# Patient Record
Sex: Female | Born: 1955 | Hispanic: Yes | Marital: Married | Smoking: Former smoker
Health system: Northeastern US, Community
[De-identification: ages and names within clinical notes are randomized; demographics above are authoritative.]

## PROBLEM LIST (undated history)

## (undated) ENCOUNTER — Ambulatory Visit (HOSPITAL_BASED_OUTPATIENT_CLINIC_OR_DEPARTMENT_OTHER): Payer: Self-pay | Source: Ambulatory Visit | Admitting: Gastroenterology

## (undated) DIAGNOSIS — H5200 Hypermetropia, unspecified eye: Secondary | ICD-10-CM

## (undated) DIAGNOSIS — E7800 Pure hypercholesterolemia, unspecified: Secondary | ICD-10-CM

## (undated) DIAGNOSIS — Z973 Presence of spectacles and contact lenses: Secondary | ICD-10-CM

## (undated) DIAGNOSIS — Z9049 Acquired absence of other specified parts of digestive tract: Secondary | ICD-10-CM

## (undated) DIAGNOSIS — A630 Anogenital (venereal) warts: Secondary | ICD-10-CM

## (undated) DIAGNOSIS — Z9889 Other specified postprocedural states: Secondary | ICD-10-CM

## (undated) DIAGNOSIS — I10 Essential (primary) hypertension: Secondary | ICD-10-CM

## (undated) DIAGNOSIS — H524 Presbyopia: Secondary | ICD-10-CM

## (undated) DIAGNOSIS — M1990 Unspecified osteoarthritis, unspecified site: Secondary | ICD-10-CM

## (undated) DIAGNOSIS — Z789 Other specified health status: Secondary | ICD-10-CM

## (undated) DIAGNOSIS — H9201 Otalgia, right ear: Secondary | ICD-10-CM

## (undated) HISTORY — DX: Hypermetropia, unspecified eye: H52.00

## (undated) HISTORY — PX: OB ANTEPARTUM CARE CESAREAN DLVR & POSTPARTUM: REP299

## (undated) HISTORY — DX: Unspecified osteoarthritis, unspecified site: M19.90

## (undated) HISTORY — DX: Otalgia, right ear: H92.01

## (undated) HISTORY — DX: Anogenital (venereal) warts: A63.0

## (undated) HISTORY — DX: Presence of spectacles and contact lenses: Z97.3

## (undated) HISTORY — DX: Presbyopia: H52.4

## (undated) HISTORY — DX: Other specified postprocedural states: Z98.890

## (undated) HISTORY — PX: HYSTEROSCOPY BX ENDOMETRIUM&/POLYPC W/WO D&C: REP193

## (undated) HISTORY — DX: Essential (primary) hypertension: I10

## (undated) HISTORY — DX: Acquired absence of other specified parts of digestive tract: Z90.49

---

## 1998-05-15 HISTORY — PX: APPENDECTOMY: GID334

## 2002-05-21 ENCOUNTER — Emergency Department (HOSPITAL_BASED_OUTPATIENT_CLINIC_OR_DEPARTMENT_OTHER): Payer: Self-pay | Admitting: Emergency Medicine

## 2002-06-03 ENCOUNTER — Emergency Department (HOSPITAL_BASED_OUTPATIENT_CLINIC_OR_DEPARTMENT_OTHER): Payer: Self-pay

## 2002-06-06 ENCOUNTER — Ambulatory Visit: Payer: Self-pay | Admitting: Internal Medicine

## 2002-06-06 ENCOUNTER — Ambulatory Visit (HOSPITAL_BASED_OUTPATIENT_CLINIC_OR_DEPARTMENT_OTHER): Payer: Self-pay

## 2002-06-08 LAB — BILATERAL ADD VIEWS

## 2002-06-08 LAB — US BREAST-AXILLA LEFT

## 2002-06-08 LAB — US BREAST-AXILLA RIGHT

## 2002-06-23 ENCOUNTER — Encounter: Payer: Self-pay | Admitting: Internal Medicine

## 2002-06-24 LAB — CYST ASPIRATION BREAST - ULTRA

## 2002-06-24 LAB — US BREAST-AXILLA LEFT

## 2002-06-24 LAB — US BREAST-AXILLA RIGHT

## 2002-08-04 ENCOUNTER — Ambulatory Visit: Payer: Self-pay | Admitting: Internal Medicine

## 2002-08-04 LAB — BLOOD COUNT COMPLETE AUTO&AUTO DIFRNTL WBC
BASOPHIL %: 0.8 % (ref 0–2)
EOSINOPHIL %: 1 % (ref 0–7)
HEMATOCRIT: 32.8 % — ABNORMAL LOW (ref 37.0–47.0)
HEMOGLOBIN: 11.1 g/dL — ABNORMAL LOW (ref 12.0–16.0)
LYMPHOCYTE %: 30 % (ref 12–39)
MEAN CORP HGB CONC: 33.9 g/dL (ref 32.0–36.0)
MEAN CORPUSCULAR HGB: 29.3 pg (ref 27.0–31.0)
MEAN CORPUSCULAR VOL: 86.6 fL (ref 81.0–99.0)
MEAN PLATELET VOLUME: 11.1 fL — ABNORMAL HIGH (ref 6.4–10.8)
MONOCYTE %: 6.8 % (ref 1–12)
NEUTROPHIL %: 61.4 % (ref 46–79)
PLATELET COUNT: 174 10*3/uL (ref 150–400)
RBC DISTRIBUTION WIDTH: 15.7 % — ABNORMAL HIGH (ref 11.5–14.3)
RED BLOOD CELL COUNT: 3.78 MIL/uL — ABNORMAL LOW (ref 4.20–5.40)
WHITE BLOOD CELL COUNT: 6.3 10*3/uL (ref 4.8–10.8)

## 2002-08-04 LAB — COMPREHENSIVE METABOLIC PANEL
ALANINE AMINOTRANSFERASE: 18 IU/L (ref 3–36)
ALBUMIN: 4 g/dl (ref 3.5–5.0)
ALKALINE PHOSPHATASE: 52 IU/L (ref 50–136)
ASPARTATE AMINOTRANSFERASE: 21 U/L (ref 7–29)
BILIRUBIN TOTAL: 0.5 mg/dl (ref 0.15–1.0)
BUN (UREA NITROGEN): 12 mg/dl (ref 10–20)
CALCIUM: 9 mg/dl (ref 8.5–10.5)
CARBON DIOXIDE: 30 mEQ/L (ref 22–32)
CHLORIDE: 108 mEQ/L (ref 98–110)
CREATININE: 0.7 mg/dl — ABNORMAL LOW (ref 0.8–1.2)
Glucose Random: 96 mg/dl (ref 65–160)
POTASSIUM: 3.7 mEQ/L (ref 3.5–5.0)
SODIUM: 139 mEQ/L (ref 135–145)
TOTAL PROTEIN: 7 g/dl (ref 6.2–8.5)

## 2002-08-04 LAB — CHG LIPOPROTEIN DIRECT MEASUREMENT LDL CHOLESTEROL: LOW DENSITY LIPOPROTEIN DIRECT: 162 mg/dl — ABNORMAL HIGH (ref ?–130)

## 2002-08-04 LAB — CHG LIPOPROTEIN DIR MEAS HIGH DENSITY CHOLESTEROL: HIGH DENSITY LIPOPROTEIN: 48 mg/dl (ref 35–95)

## 2002-08-04 LAB — CHOLESTEROL SERUM/WHOLE BLOOD TOTAL: Cholesterol: 222 mg/dl — ABNORMAL HIGH (ref 0–200)

## 2002-08-05 LAB — THYROID SCREEN TSH REFLEX FT4: THYROID SCREEN TSH REFLEX FT4: 1.5 u[IU]/mL (ref 0.34–5.60)

## 2002-08-14 ENCOUNTER — Ambulatory Visit: Payer: Self-pay | Admitting: Internal Medicine

## 2002-08-14 LAB — CYTOPATH, C/V, THIN LAYER

## 2002-08-15 LAB — IADNA NEISSERIA GONORRHOEAE DIRECT PROBE TQ: GENPROBE GC: NEGATIVE

## 2002-08-15 LAB — IADNA CHLAMYDIA TRACHOMATIS DIRECT PROBE TQ: GENPROBE CHLAMYDIA: NEGATIVE

## 2002-08-25 ENCOUNTER — Emergency Department (HOSPITAL_BASED_OUTPATIENT_CLINIC_OR_DEPARTMENT_OTHER): Payer: Self-pay | Admitting: Emergency Medicine

## 2002-09-05 ENCOUNTER — Other Ambulatory Visit: Payer: Self-pay | Admitting: Internal Medicine

## 2002-09-10 LAB — US PELVIC NON-PREGNANT

## 2002-09-10 LAB — US TRANSVAGINAL NON-OB

## 2002-10-02 ENCOUNTER — Ambulatory Visit: Payer: Self-pay | Admitting: Obstetrics & Gynecology

## 2002-10-03 LAB — ~~LOC~~-HISTORY & PHYSICAL

## 2002-12-25 ENCOUNTER — Ambulatory Visit: Payer: Self-pay | Admitting: Obstetrics & Gynecology

## 2003-01-14 HISTORY — PX: ENDOMETRIAL BX W/WO ENDOCERVIX BX W/O DILAT SPX: REP147

## 2003-01-26 LAB — BLOOD COUNT COMPLETE AUTOMATED
HEMATOCRIT: 41.1 % (ref 37.0–47.0)
HEMOGLOBIN: 13.8 g/dL (ref 12.0–16.0)
MEAN CORP HGB CONC: 33.5 g/dL (ref 32.0–36.0)
MEAN CORPUSCULAR HGB: 32.3 pg — ABNORMAL HIGH (ref 27.0–31.0)
MEAN CORPUSCULAR VOL: 96.4 fL (ref 81.0–99.0)
MEAN PLATELET VOLUME: 11.5 fL — ABNORMAL HIGH (ref 6.4–10.8)
PLATELET COUNT: 214 10*3/uL (ref 150–400)
RBC DISTRIBUTION WIDTH: 13.1 % (ref 11.5–14.3)
RED BLOOD CELL COUNT: 4.27 MIL/uL (ref 4.20–5.40)
WHITE BLOOD CELL COUNT: 7.7 10*3/uL (ref 4.8–10.8)

## 2003-01-26 LAB — SURGICAL PATH SPECIMEN

## 2003-02-02 ENCOUNTER — Encounter: Payer: Self-pay | Admitting: Internal Medicine

## 2003-02-03 ENCOUNTER — Encounter (HOSPITAL_BASED_OUTPATIENT_CLINIC_OR_DEPARTMENT_OTHER): Payer: Self-pay | Admitting: Obstetrics & Gynecology

## 2003-02-09 ENCOUNTER — Ambulatory Visit: Payer: Self-pay | Admitting: Obstetrics & Gynecology

## 2003-03-09 ENCOUNTER — Ambulatory Visit: Payer: Self-pay | Admitting: Obstetrics & Gynecology

## 2003-03-09 DIAGNOSIS — B079 Viral wart, unspecified: Principal | ICD-10-CM

## 2003-03-09 DIAGNOSIS — J329 Chronic sinusitis, unspecified: Secondary | ICD-10-CM

## 2003-04-20 ENCOUNTER — Ambulatory Visit: Payer: Self-pay | Admitting: Internal Medicine

## 2003-04-20 DIAGNOSIS — R51 Headache: Secondary | ICD-10-CM

## 2003-04-20 DIAGNOSIS — Z23 Encounter for immunization: Secondary | ICD-10-CM

## 2003-04-20 DIAGNOSIS — M25519 Pain in unspecified shoulder: Secondary | ICD-10-CM

## 2003-04-30 ENCOUNTER — Encounter: Payer: Self-pay | Admitting: Ophthalmology

## 2003-05-04 ENCOUNTER — Ambulatory Visit (HOSPITAL_BASED_OUTPATIENT_CLINIC_OR_DEPARTMENT_OTHER): Payer: Self-pay

## 2003-12-17 ENCOUNTER — Encounter (HOSPITAL_BASED_OUTPATIENT_CLINIC_OR_DEPARTMENT_OTHER): Payer: Self-pay | Admitting: Internal Medicine

## 2003-12-17 DIAGNOSIS — N6009 Solitary cyst of unspecified breast: Secondary | ICD-10-CM | POA: Insufficient documentation

## 2004-01-18 ENCOUNTER — Emergency Department: Payer: Self-pay | Admitting: Emergency Medical Services

## 2004-01-19 LAB — XR FOOT LEFT MINIMUM 3 VIEWS

## 2004-08-30 ENCOUNTER — Emergency Department (HOSPITAL_BASED_OUTPATIENT_CLINIC_OR_DEPARTMENT_OTHER): Payer: Self-pay | Admitting: Emergency Medicine

## 2004-08-30 LAB — BLOOD COUNT COMPLETE AUTO&AUTO DIFRNTL WBC
BASOPHIL %: 0.8 % (ref 0–2)
EOSINOPHIL %: 0.7 % (ref 0–7)
HEMATOCRIT: 32.1 % — ABNORMAL LOW (ref 37.0–47.0)
HEMOGLOBIN: 10.4 g/dL — ABNORMAL LOW (ref 12.0–16.0)
LYMPHOCYTE %: 20.1 % (ref 12–39)
MEAN CORP HGB CONC: 32.3 g/dL (ref 32.0–36.0)
MEAN CORPUSCULAR HGB: 26 pg — ABNORMAL LOW (ref 27.0–31.0)
MEAN CORPUSCULAR VOL: 80.5 fL — ABNORMAL LOW (ref 81.0–99.0)
MEAN PLATELET VOLUME: 11.3 fL — ABNORMAL HIGH (ref 6.4–10.8)
MONOCYTE %: 9.1 % (ref 1–12)
NEUTROPHIL %: 69.3 % (ref 46–79)
PLATELET COUNT: 206 10*3/uL (ref 150–400)
RBC DISTRIBUTION WIDTH: 16.2 % — ABNORMAL HIGH (ref 11.5–14.3)
RED BLOOD CELL COUNT: 3.99 MIL/uL — ABNORMAL LOW (ref 4.20–5.40)
WHITE BLOOD CELL COUNT: 4.4 10*3/uL — ABNORMAL LOW (ref 4.8–10.8)

## 2004-08-30 LAB — WBC DIFFERENTIAL SCAN

## 2004-08-30 LAB — BASIC METABOLIC PANEL
BUN (UREA NITROGEN): 7 mg/dl (ref 6–20)
CALCIUM: 9 mg/dl (ref 8.6–10.0)
CARBON DIOXIDE: 29 mmol/L (ref 22–32)
CHLORIDE: 104 mmol/L (ref 101–111)
CREATININE: 0.6 mg/dl (ref 0.4–1.2)
Glucose Random: 133 mg/dl (ref 74–160)
POTASSIUM: 4 mmol/L (ref 3.5–5.1)
SODIUM: 138 mmol/L (ref 135–144)

## 2004-09-09 ENCOUNTER — Ambulatory Visit (HOSPITAL_BASED_OUTPATIENT_CLINIC_OR_DEPARTMENT_OTHER): Payer: Self-pay | Admitting: Emergency Medicine

## 2004-10-26 ENCOUNTER — Emergency Department: Payer: Self-pay | Admitting: Emergency Medicine

## 2004-10-26 LAB — XR KNEE LEFT 3 VIEWS

## 2004-11-07 ENCOUNTER — Telehealth (HOSPITAL_BASED_OUTPATIENT_CLINIC_OR_DEPARTMENT_OTHER): Payer: Self-pay | Admitting: Infectious Diseases

## 2004-11-07 NOTE — Telephone Encounter (Signed)
Tc via interp.    S/p fall and seen in ED 6/14.Had - xray but still having a lot of pain in L leg. 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 Was getting relief w/Ibuprofen but wants to find out what the problem is. No pain at rest.

## 2004-11-07 NOTE — Telephone Encounter (Signed)
Staff Message copied by Bevelyn Buckles on 11/07/2004 at 1:05 PM  ------   Message from: Merrilee Jansky, RITA   Created: 11/07/2004 at 12:15 PM   Regarding: KNEE PROBLEM [facility]   Contact: [phone]    Tina [name] [id number], 49 year old, female, [phone] (home) [phone] (work)    CALL BACK NUMBER:   Cell phone:   Other phone:    Available times:    Patient's language of care: [place]    Patient needs a [place] interpreter.    Person calling on behalf of patient: patient (self)    Calls today with a sick call.  WENT TO ER ABOUT WEEK AGO,WANTS TO BE SEEN

## 2004-11-08 ENCOUNTER — Ambulatory Visit (HOSPITAL_BASED_OUTPATIENT_CLINIC_OR_DEPARTMENT_OTHER): Payer: Charity

## 2004-11-08 VITALS — BP 148/88

## 2004-11-08 DIAGNOSIS — M25569 Pain in unspecified knee: Secondary | ICD-10-CM

## 2004-11-08 NOTE — Progress Notes (Signed)
Seen for knee pain. 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 She was seen at the ED and had negative films, was given a knee brace and prescribed motrin 800 tid. Since that time she continues to have pain in the knee, mostly while walking or bending it, to the point where she has difficulty doing the new tasks assigned to her at work. She took 3 days off from work this week. There is no buckling or weakness of the knee, no crepitus or popping. She took the motrin as directed for a week, then backed off to prn. She used ice packs initially but not after the first few days.     BP 148/88  Well appearing, NAD, limps into exam room.knee brace in place  S1 and S2 normal, no murmurs, clicks, gallops or rubs. Regular rate and rhythm. Chest is clear; no wheezes or rales. No edema or JVD.  Knee exam - right normal; full range of motion, no pain on motion, no effusion, tenderness, masses, ligamentous instability or deformity noted.  Left knee has full range of motion passively but has pain on flexion. The knee is diffusely tender to palpation with no focal area of tenderness. There is no effusion, warmth, masses, ligamentous instability or deformity noted.    719.46 JOINT PAIN-L/LEG (primary encounter diagnosis)  Note: ligament strain/sprain. Given history of work related injury and need to return to full capacity, will opt for earlier rather than later ortho involvement. Also will opt to repeat full dose NSAIDS x one more week. Continue knee brace and cane as necessary.   Plan: REFERRAL TO ORTHOPEDICS (INT)   F/u with PCP in 2 wks    High BP reading: Pt states she has never had elevated bp reading in the past. Will have BP rechecked when she follows up with pcp.

## 2004-11-09 NOTE — Progress Notes (Signed)
PRECEPTOR NOTE  I personally interviewed and examined the patient yesterday. I reviewed findings with the resident.  I confirm the key elements of history and physical exam as described in resident's note.  I agree with the assessment and plan as described below. In short, physical exam findings are normal but pain & limping are significant.  Please see resident's note for further details.

## 2004-11-23 ENCOUNTER — Encounter (HOSPITAL_BASED_OUTPATIENT_CLINIC_OR_DEPARTMENT_OTHER): Payer: Self-pay | Admitting: Internal Medicine

## 2004-11-23 ENCOUNTER — Ambulatory Visit (HOSPITAL_BASED_OUTPATIENT_CLINIC_OR_DEPARTMENT_OTHER): Payer: Charity | Admitting: Internal Medicine

## 2004-11-23 VITALS — BP 145/85 | HR 72 | Temp 98.7°F | Ht 64.0 in | Wt 136.0 lb

## 2004-11-23 DIAGNOSIS — G44209 Tension-type headache, unspecified, not intractable: Secondary | ICD-10-CM

## 2004-11-23 DIAGNOSIS — K591 Functional diarrhea: Secondary | ICD-10-CM

## 2004-11-23 DIAGNOSIS — Z124 Encounter for screening for malignant neoplasm of cervix: Secondary | ICD-10-CM

## 2004-11-23 DIAGNOSIS — N924 Excessive bleeding in the premenopausal period: Secondary | ICD-10-CM | POA: Insufficient documentation

## 2004-11-23 DIAGNOSIS — Z0000 Encounter for general adult medical examination without abnormal findings: Secondary | ICD-10-CM

## 2004-11-23 DIAGNOSIS — R030 Elevated blood-pressure reading, without diagnosis of hypertension: Secondary | ICD-10-CM

## 2004-11-23 LAB — COMPREHENSIVE METABOLIC PANEL
ALANINE AMINOTRANSFERASE: 18 IU/L (ref 7–35)
ALBUMIN: 3.9 g/dl (ref 3.4–4.8)
ALKALINE PHOSPHATASE: 64 IU/L (ref 25–106)
ASPARTATE AMINOTRANSFERASE: 19 IU/L (ref 8–34)
BILIRUBIN TOTAL: 0.6 mg/dl (ref 0.2–1.1)
BUN (UREA NITROGEN): 8 mg/dl (ref 6–20)
CALCIUM: 8.7 mg/dl (ref 8.6–10.0)
CARBON DIOXIDE: 26 mmol/L (ref 22–32)
CHLORIDE: 104 mmol/L (ref 101–111)
CREATININE: 0.7 mg/dl (ref 0.4–1.2)
Glucose Random: 114 mg/dl (ref 74–160)
POTASSIUM: 4.4 mmol/L (ref 3.5–5.1)
SODIUM: 138 mmol/L (ref 135–144)
TOTAL PROTEIN: 6.6 g/dl (ref 5.9–7.5)

## 2004-11-23 LAB — CHG LIPID PANEL
Cholesterol: 221 mg/dl — ABNORMAL HIGH (ref 0–200)
HIGH DENSITY LIPOPROTEIN: 34 mg/dl — ABNORMAL LOW (ref 35–85)
LOW DENSITY LIPOPROTEIN DIRECT: 194 mg/dl (ref 0–100)
RISK FACTOR: 6.5 — ABNORMAL HIGH (ref ?–4.4)
TRIGLYCERIDES: 84 mg/dl (ref 0–150)

## 2004-11-23 LAB — BLOOD COUNT COMPLETE AUTO&AUTO DIFRNTL WBC
BASOPHIL %: 0.5 % (ref 0–2)
EOSINOPHIL %: 0 % (ref 0–7)
HEMATOCRIT: 33.2 % — ABNORMAL LOW (ref 37.0–47.0)
HEMOGLOBIN: 10.4 g/dL — ABNORMAL LOW (ref 12.0–16.0)
LYMPHOCYTE %: 27.8 % (ref 12–39)
MEAN CORP HGB CONC: 31.4 g/dL — ABNORMAL LOW (ref 32.0–36.0)
MEAN CORPUSCULAR HGB: 25.5 pg — ABNORMAL LOW (ref 27.0–31.0)
MEAN CORPUSCULAR VOL: 81.3 fL (ref 81.0–99.0)
MEAN PLATELET VOLUME: 12.2 fL — ABNORMAL HIGH (ref 6.4–10.8)
MONOCYTE %: 6.9 % (ref 1–12)
NEUTROPHIL %: 64.8 % (ref 46–79)
PLATELET COUNT: 242 10*3/uL (ref 150–400)
RBC DISTRIBUTION WIDTH: 17.3 % — ABNORMAL HIGH (ref 11.5–14.3)
RED BLOOD CELL COUNT: 4.08 MIL/uL — ABNORMAL LOW (ref 4.20–5.40)
WHITE BLOOD CELL COUNT: 5.9 10*3/uL (ref 4.8–10.8)

## 2004-11-23 LAB — URINALYSIS
BILIRUBIN, URINE: NEGATIVE
GLUCOSE, URINE: NEGATIVE MG/DL
KETONE, URINE: NEGATIVE MG/DL
LEUKOCYTE ESTERASE: NEGATIVE
NITRITE, URINE: NEGATIVE
OCCULT BLOOD, URINE: NEGATIVE
PH URINE: 6.5 (ref 5.0–8.0)
PROTEIN, URINE: NEGATIVE MG/DL
SPECIFIC GRAVITY URINE: 1.02 (ref 1.003–1.035)

## 2004-11-23 LAB — CHG ASSAY OF FERRITIN: FERRITIN: 4 ng/ml — ABNORMAL LOW (ref 11–307)

## 2004-11-23 LAB — THYROID SCREEN TSH REFLEX FT4: THYROID SCREEN TSH REFLEX FT4: 1.29 u[IU]/mL (ref 0.34–5.60)

## 2004-11-23 LAB — CHG IRON BINDING CAPACITY: TOTAL IRON BIND CAPACITY CALC: 465 ug/dL — ABNORMAL HIGH (ref 282–427)

## 2004-11-23 LAB — IRON: IRON: 35 ug/dl (ref 28–170)

## 2004-11-23 LAB — CYANOCOBALAMIN VITAMIN B-12: VITAMIN B12: 227 pg/ml (ref 180–914)

## 2004-11-23 LAB — CHG ASSAY OF FOLIC ACID SERUM: FOLATE: 12.3 ng/ml (ref 3.0–?)

## 2004-11-23 LAB — PLATELET SCAN: PLATELET ESTIMATE: NORMAL

## 2004-11-23 MED ORDER — FLONASE 50 MCG/DOSE NA INHA
NASAL | Status: DC
Start: 2004-11-23 — End: 2006-05-30

## 2004-11-23 NOTE — Nursing Note (Signed)
>>   Tina [name]     11/23/2004   12:30 pm  Labs drawn.

## 2004-11-23 NOTE — Progress Notes (Signed)
SUBJECTIVE:  Tina [name] is a 49 year old female with the following Problems and Medications    Patient Active Problem List:   BREAST CYSTS [610.0]   JOINT PAIN-L/LEG [719.46]      No current outpatient prescriptions on file.      Recently seen for knee problem - has follow up tomorrow with ortho. Also was noted to have high BP. (My blood pressure has always been elevated)    I think my body is not well x 6 months - I feel very tired - even with minimal exertion. I feel a certain dizziness. Sometimes I have strong stomach pains - occur 2-3 times per week - in epigastrium - strong pain - lasts just seconds, then goes away.     Frequently having diarrhea x 3-4 months (got worse after her father died suddenly of an MI two months ago - was unable to attend his funeral in [place]) - often with lower abdominal pain -liquid stool - no blood noted - .    I just feel like staying in bed and doing nothing.     Feels unable to do physical work that she would like to do.    I am feeling more sad than happy, but denies depression.     ROS:  Const: stable weight; sleeping well; no night sweats.  Eyes: no visual complaints - uses corrective lens for close vision.  ENT/Mouth: last dental visit was . no complaints.  CV: occ chest pain which lasts for a minute or so - which she attributes to gas; no other complaints  Resp: feels unable to take a deep full breath; no cough;  GI: abdominal pain as above; bowel movements as above.  GU: nocturia x 0. no urinary leakage.  Musc: no complaints  Skin/Breast: no rashes. no complaints.  Neuro: frequent headaches - I am used to them - longstanding since childhood - has previously been related to sinus problems - these are the same type of HAs - takes Ibuprofen when the HA becomes strong. no numbness, tingling. no other complaints.  Psych: denies depression.  Endo: normal menstrual cycle - but with very heavy periods.    OBJECTIVE:  BP 145/85  Pulse 72  Temp 98.7  Ht 5\' 4"  (1.37m)  Wt 136 lbs  (61.7kg)  Appearance: NAD  HEENT: NC/AT MMM Throat-clear  NECK: Supple; no LAD;  LUNGS: CTA  CV: RRR s1s2 no m/r/g  ABD: soft NT/ND nl bs  RECTAL: deferred  EXTR: no cyanosis/clubbing/edema  NEURO: alert and oriented x 3;    ASSESSMENT:PLAN:  V70.0 PHYSICAL EXAM (SEE ALSO ENCOUNTER) (primary encounter diagnosis)  Plan: ROUTINE VENIPUNCTURE, COMPLETE CBC W/AUTO DIFF    WBC, IRON, IRON BINDING TEST, ASSAY OF    FERRITIN, VITAMIN B-12, BLOOD FOLIC ACID SERUM,   COMPREHENSIVE METABOLIC PANEL, LIPID PANEL,    THYROID SCREEN TSH, URINALYSIS       307.81 HEADACHE TENSION vs. 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    V76.2 SCREEN (SEE ALSO ADMISSION) CANCER - CERVIX  Plan: REFERRAL TO GYNECOLOGY (INT)   - also to review her history of heavy bleeding.    796.2 HIGH BLOOD PRESSURE-NO HYPERTENSN  Plan: URINALYSIS   Low salt diet recommended.   - follow up in 2 months to recheck BP with RN    564.5 DIARRHEA, FUNCTIONAL  Note: Likely related to stress (became worse after father's death - is not everyday.)  Plan: Check labwork, follow clinically.    follow up as above.

## 2004-11-24 ENCOUNTER — Telehealth (HOSPITAL_BASED_OUTPATIENT_CLINIC_OR_DEPARTMENT_OTHER): Payer: Self-pay | Admitting: Internal Medicine

## 2004-11-24 ENCOUNTER — Encounter (HOSPITAL_BASED_OUTPATIENT_CLINIC_OR_DEPARTMENT_OTHER): Payer: Charity | Admitting: Orthopaedic Surgery

## 2004-11-24 DIAGNOSIS — E7800 Pure hypercholesterolemia, unspecified: Secondary | ICD-10-CM

## 2004-11-24 DIAGNOSIS — M238X9 Other internal derangements of unspecified knee: Secondary | ICD-10-CM

## 2004-11-24 DIAGNOSIS — N924 Excessive bleeding in the premenopausal period: Secondary | ICD-10-CM

## 2004-11-24 NOTE — Telephone Encounter (Signed)
Discussed with patient her lab results.    Fe Def Anemia - needs replacement - Fe Sulphate - gradually up to tid.    Elevated LDL - needs dietary changes.     follow up in 3 months to recheck blood work.

## 2004-12-08 ENCOUNTER — Ambulatory Visit (HOSPITAL_BASED_OUTPATIENT_CLINIC_OR_DEPARTMENT_OTHER): Payer: Self-pay | Admitting: Orthopaedic Surgery

## 2004-12-20 ENCOUNTER — Other Ambulatory Visit: Payer: Self-pay | Admitting: Internal Medicine

## 2004-12-20 DIAGNOSIS — Z1231 Encounter for screening mammogram for malignant neoplasm of breast: Secondary | ICD-10-CM

## 2004-12-21 LAB — MA DIAGNOSTIC MAMMO BILATERAL WITH CAD

## 2005-01-02 ENCOUNTER — Encounter (HOSPITAL_BASED_OUTPATIENT_CLINIC_OR_DEPARTMENT_OTHER): Payer: Self-pay | Admitting: Obstetrics & Gynecology

## 2005-01-02 ENCOUNTER — Ambulatory Visit (HOSPITAL_BASED_OUTPATIENT_CLINIC_OR_DEPARTMENT_OTHER): Payer: Charity | Admitting: Obstetrics & Gynecology

## 2005-01-02 VITALS — BP 100/80 | Wt 134.0 lb

## 2005-01-02 DIAGNOSIS — Z01419 Encounter for gynecological examination (general) (routine) without abnormal findings: Secondary | ICD-10-CM

## 2005-01-02 DIAGNOSIS — N924 Excessive bleeding in the premenopausal period: Principal | ICD-10-CM

## 2005-01-02 LAB — BLOOD COUNT COMPLETE AUTO&AUTO DIFRNTL WBC
BASOPHIL %: 1 % (ref 0–2)
EOSINOPHIL %: 1.7 % (ref 0–7)
HEMATOCRIT: 39.5 % (ref 37.0–47.0)
HEMOGLOBIN: 13 g/dL (ref 12.0–16.0)
LYMPHOCYTE %: 28.7 % (ref 12–39)
MEAN CORP HGB CONC: 33 g/dL (ref 32.0–36.0)
MEAN CORPUSCULAR HGB: 29.1 pg (ref 27.0–31.0)
MEAN CORPUSCULAR VOL: 88.1 fL (ref 81.0–99.0)
MEAN PLATELET VOLUME: 11.8 fL — ABNORMAL HIGH (ref 6.4–10.8)
MONOCYTE %: 5.8 % (ref 1–12)
NEUTROPHIL %: 62.8 % (ref 46–79)
PLATELET COUNT: 188 10*3/uL (ref 150–400)
RBC DISTRIBUTION WIDTH: 22.1 % (ref 11.5–14.3)
RED BLOOD CELL COUNT: 4.48 MIL/uL (ref 4.20–5.40)
WHITE BLOOD CELL COUNT: 5.9 10*3/uL (ref 4.8–10.8)

## 2005-01-02 LAB — RBCMORPH

## 2005-01-02 LAB — CYTOPATH, C/V, THIN LAYER

## 2005-01-02 MED ORDER — DEPO-PROVERA 150 MG/ML IM SUSP
INTRAMUSCULAR | Status: DC
Start: 2005-01-02 — End: 2006-05-30

## 2005-01-02 NOTE — Progress Notes (Signed)
49 year old  Obstetric History   G4p4 s/p TL   Here for f/u of menorrhagia  She was last seen 5/04 when she had an u/s showing a sub mucous fibroid and a polyp. Endo biopsy was normal    Recommended a hysteroscope which she never followed through with. She had received one dose of depo provera with good results.    She is currently on FE with know Fe deficient anemia  She states her menses have always been heavy, but the last two months they are worse. Her menses are regular.        Review of Patient's Allergies indicates:   Nkda (no known drug*     Tobacco Use:  Never  ETOH neg    Cardiovascular: negative  Gastrointestinal: negative  Genitourinary: negative  Musculoskeletal: negative  Neurologic: migraine headaches  Psychiatric: negative/ had fatigue but better now on iron  OBJECTIVE:  Well appearing.  BP 100/80  Wt 134 lbs (60.8kg)  Lungs clear  Cor RRR  Breasts extrememly tender, no masses palpated, but ltd exam.    Abdomen soft, non-tender. BS normal. No masses, no organomegaly  Pelvic exam: normal vagina and vulva, normal cervix without lesions, polyps or tenderness, Nabothian cyst at 12 and one o'clock, uterus anteverted, uterus normal size, shape, consistency, no mass or tenderness, adnexa normal in size without mass or tenderness, pap smear done today, DNA probe for GC/chlamydia obtained.    HCT 33.2 11/23/2004]    ASSESSMENT:  Menorrhagia with anemia probably relalated to polyp and/ or fibroid.      PLAN:  Discussed options for the management of menorrhagia  Hormonal treatment  Hysteroscopy  Endometrial ablation  Hysterectomy    Rocommend hysteroscopy with possible endometrial ablation.    She is not ready to schedule it yet as she is starting a new job    Will take depo provera for now  F/u 3 months  Repeat CBC

## 2005-01-02 NOTE — Nursing Note (Signed)
>>   HAMLIN, CHERYL     01/02/2005   11:45 am  DMPA given. See EPIC injections. DMPA given by B.Crookes Charity fundraiser

## 2005-01-05 LAB — CHLAMYDIA GC NAAT
GENPROBE CHLAMYDIA: NEGATIVE
GENPROBE GC: NEGATIVE

## 2005-01-18 ENCOUNTER — Encounter (HOSPITAL_BASED_OUTPATIENT_CLINIC_OR_DEPARTMENT_OTHER): Payer: Self-pay | Admitting: Obstetrics & Gynecology

## 2005-01-20 ENCOUNTER — Ambulatory Visit (HOSPITAL_BASED_OUTPATIENT_CLINIC_OR_DEPARTMENT_OTHER): Payer: Charity | Admitting: Ambulatory Care

## 2005-01-20 VITALS — BP 118/76 | HR 98

## 2005-01-20 DIAGNOSIS — R030 Elevated blood-pressure reading, without diagnosis of hypertension: Secondary | ICD-10-CM

## 2005-01-20 NOTE — Nursing Note (Signed)
>>   [name], Tina     01/20/2005   11:17 am  Pt here for bp check .   Bp this a.m right 118/76. Left 116/76 pulse 98. Denies any chest pain or pressure . Denies s.o.b . No complaints of headache .   Takes no bp medication .   Pt feels well , " a new woman" .

## 2005-02-03 ENCOUNTER — Telehealth (HOSPITAL_BASED_OUTPATIENT_CLINIC_OR_DEPARTMENT_OTHER): Payer: Self-pay

## 2005-02-03 NOTE — Telephone Encounter (Signed)
Staff Message copied by Ardeen Fillers on 02/03/2005 at 12:49 PM  ------   Message from: Encarnacion Chu   Created: 02/03/2005 at 12:34 PM   Regarding: period    Tina [name] [id number], 49 year old, female, [phone] (home) [phone] (work)    CALL BACK NUMBER: home  Cell phone:   Other phone:    Available times:    Patient's language of care: [place]    Patient needs a [place] interpreter.    Person calling on behalf of patient: patient (self)    Calls today having period x 1 month

## 2005-02-03 NOTE — Telephone Encounter (Signed)
Give her an appointment to see Dr. Domingo Cocking next week.

## 2005-02-03 NOTE — Telephone Encounter (Signed)
TC to pt with [facility] interpreter on line. Told pt that Dr. Raylene Miyamoto wants her to see Dr. Domingo Cocking next week. Pt agrees to do that. Transferred to front desk staff to make an appointment.

## 2005-02-03 NOTE — Telephone Encounter (Signed)
Pt has hx of menorrhagia. Was given DMPA to control bleeding 01/02/05. Bleeding decreased to spotting 1 week after injection. Approx 1 wk ago bleeding increased. Changing pad 2-3 times per day. Denies cramps. Has noted small clots. Pt states she always had clots with menses. Last normal menses was 7/06. Pt concerned with vaginal bleeding. Forwarded to Dr.Chaudhuri for f/u plan.

## 2005-02-13 ENCOUNTER — Ambulatory Visit (HOSPITAL_BASED_OUTPATIENT_CLINIC_OR_DEPARTMENT_OTHER): Payer: Self-pay | Admitting: Obstetrics & Gynecology

## 2005-02-13 VITALS — BP 110/80 | Wt 134.0 lb

## 2005-02-13 DIAGNOSIS — N924 Excessive bleeding in the premenopausal period: Secondary | ICD-10-CM

## 2005-02-13 NOTE — Progress Notes (Signed)
49 year old  Obstetric History   G4 P4 T4 P0 TAB0 SAB0 E0 M0 L4     On depo since 8/12 for menorragia c/o persistant vaginal spotting for one month after depo provera injection. Now bleeding has stopped.    Just wanted reassurance that this is normal from depo provera.   Has appointment scheduled in novemeber to discuss bleeding pattern and long term plan.

## 2005-03-11 LAB — ORTHOPEDIC OFFICE NOTE

## 2005-03-23 ENCOUNTER — Encounter (HOSPITAL_BASED_OUTPATIENT_CLINIC_OR_DEPARTMENT_OTHER): Payer: Self-pay | Admitting: Obstetrics & Gynecology

## 2005-03-31 ENCOUNTER — Other Ambulatory Visit (HOSPITAL_BASED_OUTPATIENT_CLINIC_OR_DEPARTMENT_OTHER): Payer: Self-pay | Admitting: Lab

## 2005-03-31 DIAGNOSIS — E7800 Pure hypercholesterolemia, unspecified: Secondary | ICD-10-CM

## 2005-03-31 DIAGNOSIS — N924 Excessive bleeding in the premenopausal period: Secondary | ICD-10-CM

## 2005-03-31 LAB — CHG LIPID PANEL
Cholesterol: 211 mg/dl — ABNORMAL HIGH (ref 0–200)
HIGH DENSITY LIPOPROTEIN: 37 mg/dl (ref 35–85)
LOW DENSITY LIPOPROTEIN DIRECT: 185 mg/dl — ABNORMAL HIGH (ref 0–100)
RISK FACTOR: 5.7 — ABNORMAL HIGH (ref ?–4.4)
TRIGLYCERIDES: 65 mg/dl (ref 0–150)

## 2005-03-31 LAB — BLOOD COUNT COMPLETE AUTOMATED
HEMATOCRIT: 38.9 % (ref 37.0–47.0)
HEMOGLOBIN: 13.3 g/dL (ref 12.0–16.0)
MEAN CORP HGB CONC: 34.3 g/dL (ref 32.0–36.0)
MEAN CORPUSCULAR HGB: 32.4 pg — ABNORMAL HIGH (ref 27.0–31.0)
MEAN CORPUSCULAR VOL: 94.6 fL (ref 81.0–99.0)
MEAN PLATELET VOLUME: 11.5 fL — ABNORMAL HIGH (ref 6.4–10.8)
PLATELET COUNT: 176 10*3/uL (ref 150–400)
RBC DISTRIBUTION WIDTH: 13.5 % (ref 11.5–14.3)
RED BLOOD CELL COUNT: 4.11 MIL/uL — ABNORMAL LOW (ref 4.20–5.40)
WHITE BLOOD CELL COUNT: 4.7 10*3/uL — ABNORMAL LOW (ref 4.8–10.8)

## 2005-03-31 LAB — CHG ASSAY OF FERRITIN: FERRITIN: 22 ng/ml (ref 11–307)

## 2005-03-31 LAB — CHG IRON BINDING CAPACITY: TOTAL IRON BIND CAPACITY CALC: 309 ug/dL (ref 282–427)

## 2005-03-31 LAB — CHG BLOOD COUNT RETICULOCYTE AUTOMATED: RETICULOCYTE COUNT: 1.5 % (ref 0.5–1.5)

## 2005-03-31 LAB — IRON: IRON: 106 ug/dl (ref 28–170)

## 2005-03-31 NOTE — Nursing Note (Signed)
>>   Tina [name], Tina [name]     03/31/2005   4:28 pm  Labs drawn.  Dl

## 2005-04-04 ENCOUNTER — Encounter (HOSPITAL_BASED_OUTPATIENT_CLINIC_OR_DEPARTMENT_OTHER): Payer: Self-pay | Admitting: Internal Medicine

## 2005-04-04 DIAGNOSIS — E7800 Pure hypercholesterolemia, unspecified: Principal | ICD-10-CM

## 2005-08-01 ENCOUNTER — Ambulatory Visit (HOSPITAL_BASED_OUTPATIENT_CLINIC_OR_DEPARTMENT_OTHER): Payer: Self-pay | Admitting: Internal Medicine

## 2005-08-01 VITALS — BP 120/70 | HR 76 | Temp 98.2°F | Resp 18 | Wt 133.0 lb

## 2005-08-01 DIAGNOSIS — E7800 Pure hypercholesterolemia, unspecified: Secondary | ICD-10-CM

## 2005-08-01 DIAGNOSIS — N760 Acute vaginitis: Secondary | ICD-10-CM

## 2005-08-01 DIAGNOSIS — R51 Headache: Principal | ICD-10-CM

## 2005-08-01 LAB — CHG LIPID PANEL
Cholesterol: 203 mg/dl — ABNORMAL HIGH (ref 0–200)
HIGH DENSITY LIPOPROTEIN: 42 mg/dl (ref 35–85)
LOW DENSITY LIPOPROTEIN DIRECT: 150 mg/dl — ABNORMAL HIGH (ref 0–100)
RISK FACTOR: 4.8 — ABNORMAL HIGH (ref ?–4.4)
TRIGLYCERIDES: 40 mg/dl (ref 0–150)

## 2005-08-01 MED ORDER — IBUPROFEN 200 MG PO TABS
ORAL_TABLET | ORAL | Status: DC
Start: 2005-08-01 — End: 2009-01-22

## 2005-08-01 MED ORDER — DIFLUCAN 150 MG PO TABS
ORAL_TABLET | ORAL | Status: DC
Start: 2005-08-01 — End: 2006-05-30

## 2005-08-01 NOTE — Progress Notes (Signed)
Tina [name] is a 50 year old female seen with an intepreter who complains of a daily headache for the past two weeks. She often gets headaches before her period, but this headache is worse and is lasting longer. She is concerned about her periods. She is under investigation for very heavy periods and will be seeing GYN in follow-up. She denies visual abnormalities. Her headache is bitemporal and constant. It is helped minimally by tylenol. In the past she has taken ibuprofen for headaches but has not tried it this time because she ran out.     The patient requests a prescription for diflucan because she feels she has a recurrence of monilia vaginitis.    ROS: GI neg;Resp neg; CV history of hyperlipidemia; Psych neg; Rheum neg; Neuro negative other than headaches; Endo neg; Heme-Onc history of anemia.    Past Medical History:   BREAST CYSTS 12/17/2003   MENORRHAGIA / ANEMIA 11/23/2004   Comment: 2004   CONDYLOMA 11/23/2004  Past Surgical History:   APPENDECTOMY 2000    Comment: laparoscopy   BIOPSY OF UTERUS LINING 9/04    Comment: endometrial bx = negative   CESAREAN DELIVERY    Comment: tubal ligation  Current outpatient prescriptions prior to 08/01/05:  DEPO-PROVERA 150 MG/ML IM SUSP, 1 ML EVERY 3 MONTHS, Disp: .9, Rfl: 0  IRON 325 MG OR TABS, 1 TABLET DAILY, Disp: 30, Rfl: 0  FLONASE 50 MCG/DOSE NA INHA, 2 sprays in each nostril daily, Disp: 1, Rfl: 2    Social History   Marital Status: Married Spouse Name:    Years of Education: Number of children:     Occupational History  Occupation Pharmacist, hospital Works daytime    Social History Main Topics   Tobacco Use: Never    Alcohol Use: No    Drug Use: Not on file    Sexual Activity: Yes Partners with: Female   Birth Control/Protection: Surgical    Other Topics Concern   None on file    Social History Narrative   [name] - 2001 -    Lives with husband and a friend;   4 grown children are in [name]    PE:BP 120/70  Pulse 76  Temp 98.2  Resp 18  Wt 133  lbs (60.3kg)  GENERAL: Alert, in no overt distress.  HEENT: Sclera white, conjunctiva normal; no facial tenderness including TA's; TM's and pharynx normal; Fundi normal  NECK: Supple; no thyromegaly or masses; no nodes.  NEURO: Normal speech and gait. AOX3. No drift. Affect is normal.    Imp and Plan:  784.0 HEADACHE (primary encounter diagnosis)  Note: Reassurance, ice, massage, rest eyes.  Plan: IBUPROFEN 200 MG OR TABS    616.10 VAGINITIS NOS  Note: empiric treatment  Plan: DIFLUCAN 150 MG OR TABS    272.0 HYPERCHOLESTEROLEMIA  Note: Future orders: patient will have them drawn today.  Plan: ROUTINE VENIPUNCTURE, LIPID PANEL

## 2005-08-02 ENCOUNTER — Encounter (HOSPITAL_BASED_OUTPATIENT_CLINIC_OR_DEPARTMENT_OTHER): Payer: Self-pay | Admitting: Internal Medicine

## 2005-08-16 ENCOUNTER — Telehealth (HOSPITAL_BASED_OUTPATIENT_CLINIC_OR_DEPARTMENT_OTHER): Payer: Self-pay | Admitting: Ambulatory Care

## 2005-08-16 ENCOUNTER — Ambulatory Visit (HOSPITAL_BASED_OUTPATIENT_CLINIC_OR_DEPARTMENT_OTHER): Payer: Self-pay | Admitting: Internal Medicine

## 2005-08-16 VITALS — BP 120/80 | Temp 98.0°F | Wt 137.0 lb

## 2005-08-16 DIAGNOSIS — M25519 Pain in unspecified shoulder: Principal | ICD-10-CM

## 2005-08-16 DIAGNOSIS — M25559 Pain in unspecified hip: Secondary | ICD-10-CM

## 2005-08-16 DIAGNOSIS — M62838 Other muscle spasm: Secondary | ICD-10-CM

## 2005-08-16 MED ORDER — FLEXERIL 10 MG PO TABS
ORAL_TABLET | ORAL | Status: DC
Start: 2005-08-16 — End: 2006-05-30

## 2005-08-16 MED ORDER — FLEXERIL 10 MG PO TABS
ORAL_TABLET | ORAL | Status: DC
Start: 2005-08-16 — End: 2005-08-16

## 2005-08-16 MED ORDER — NAPROXEN 500 MG PO TABS
ORAL_TABLET | ORAL | Status: DC
Start: 2005-08-16 — End: 2006-05-30

## 2005-08-16 NOTE — Telephone Encounter (Signed)
Staff Message copied by Windell Moulding on 08/16/2005 at 9:26 AM  ------   Message from: Beola Cord   Created: 08/16/2005 at 9:24 AM   Regarding: severe back pain    Tina [name] [id number], 50 year old, female, [phone] (home) [phone] (work)    Cleotis Lema NUMBER: [phone]  Cell phone:   Other phone:    Available times:    Patient's language of care: [place]    Patient needs a [place] interpreter.    Patient's PCP: Casey Burkitt, MD, MD    Person calling on behalf of patient: patient (self)    Calls today with a sick call.severe back pain

## 2005-08-16 NOTE — Telephone Encounter (Signed)
Called pt with [facility] interpreter.    C/o back and arm pain for one week. Started as minor pain then worsening on Sunday- took ibuprophen 800mg  which has helped but is barely covering the pain.    Scheduled with Dr.Lebow this AM.

## 2005-08-16 NOTE — Progress Notes (Signed)
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    Denies any trauma     OBJECTIVE:  BP 120/80  Temp 98  Wt 137 lbs (62.1kg)  Looks uncomforatble  Walks slow and stooped over    Pain with palp of L ac joint as well as along the trap muscle  Rom of shoulder limited 2 to pain  Sensory intact, motor of hand intact    L hip-no pain with palp but motion limited because of pain    No redness or effusion noted in either joint    728.85 Pain in shoulder and hip of unclear etiolgy  Assoc with SPASM OF MUSCLE  Note: will try naproxen 500 bid and flexeril  Also rec to rest for the next 48 hours  Letter written to stay off work during this time  To call if worse or no improvement by Friday  Did not expect it to go away, but would hope for some improvement

## 2006-05-30 ENCOUNTER — Encounter (HOSPITAL_BASED_OUTPATIENT_CLINIC_OR_DEPARTMENT_OTHER): Payer: Self-pay | Admitting: Internal Medicine

## 2006-05-30 ENCOUNTER — Ambulatory Visit (HOSPITAL_BASED_OUTPATIENT_CLINIC_OR_DEPARTMENT_OTHER): Payer: PRIVATE HEALTH INSURANCE | Admitting: Internal Medicine

## 2006-05-30 VITALS — BP 122/72 | HR 80 | Temp 98.0°F | Ht 62.0 in | Wt 138.0 lb

## 2006-05-30 DIAGNOSIS — K029 Dental caries, unspecified: Secondary | ICD-10-CM

## 2006-05-30 DIAGNOSIS — H538 Other visual disturbances: Secondary | ICD-10-CM

## 2006-05-30 DIAGNOSIS — N6009 Solitary cyst of unspecified breast: Secondary | ICD-10-CM

## 2006-05-30 DIAGNOSIS — J329 Chronic sinusitis, unspecified: Secondary | ICD-10-CM

## 2006-05-30 DIAGNOSIS — N924 Excessive bleeding in the premenopausal period: Secondary | ICD-10-CM

## 2006-05-30 DIAGNOSIS — Z0000 Encounter for general adult medical examination without abnormal findings: Principal | ICD-10-CM

## 2006-05-30 DIAGNOSIS — Z1231 Encounter for screening mammogram for malignant neoplasm of breast: Secondary | ICD-10-CM

## 2006-05-30 MED ORDER — FLONASE 50 MCG/DOSE NA INHA
NASAL | Status: DC
Start: 2006-05-30 — End: 2008-03-27

## 2006-05-30 NOTE — Progress Notes (Signed)
SUBJECTIVE:  Tina [name] is a 51 year old female with the following Problems and Medications    Patient Active Problem List:   BREAST CYSTS [610.0]   JOINT PAIN-L/LEG [719.46]   MENORRHAGIA / ANEMIA [627.0]   CONDYLOMA [078.11]   HYPERCHOLESTEROLEMIA [272.0]      No medications the patient reported taking on file as of 05/30/2006    For CPE.   Doing well except for mild cold - has some sinus congestion which was worse yesterday.      MENORRHAGIA  - never had follow up with GYN to discuss treatments  - period has been very heavy and long - would like to have a follow up with GYN.  - not taking regular FE tabs - does bother her stomach somewhat - increased gas; but no constipation    HYPERCHOLESTEROLEMIA  Cholesterol (mg/dl)   Date Value   16/02/9603 203*   03/31/2005 211*   11/23/2004 221*  ----------    ROS:  Const: stable weight; sleeping well; no night sweats.  Eyes: + visual complaints.  ENT/Mouth: last dental visit was 1 year +. + complaints with her teeth - but has no money to take care of this problem - a filling has fallen out and tooth is turning black - was told that she needs a root canal.  CV: no chest pain; no other complaints  Resp: no SOB; no cough;  GI: no abdominal pain; normal bowel movements.  GU: no urinary complaints.  Musc: no complaints  Skin/Breast: no rashes. No recent breast cysts  Neuro: no headaches. no numbness, tingling. no other complaints.  Psych: denies depression.  Endo: abnormal menstrual cycle.    Social History   Marital Status: Married Spouse Name:    Years of Education: Number of children:     Occupational History  Occupation Cytogeneticist Works daytime       Social History Main Topics   Tobacco Use: Never    Alcohol Use: No    Drug Use: Not on file    Sexual Activity: Yes Partners with: Female   Birth Control/Protection: Surgical    Other Topics Concern   None on file    Social History Narrative   [place] - 2003 -    Lives with husband;   4 grown children are in  [place]   2 grandchildren    OBJECTIVE:  BP 122/72  Pulse 80  Temp 98  Ht 5\' 2"  (1.32m)  Wt 138 lbs (62.6kg)  Appearance: NAD  HEENT: NC/AT MMM Throat-clear; + blackened tooth - left lower molar  NECK: Supple; no LAD; no thryoidmegaly;  LUNGS: CTA  BREASTS: + quite tender diffusely - esp in upper outer quadrants - no clear masses palpated.  CV: RRR s1s2 no m/r/g  ABD: soft NT/ND nl bs  RECTAL: deferred  EXTR: no cyanosis/clubbing/edema  NEURO: alert and oriented x 3; nl gait;     ASSESSMENT:PLAN:  V70.0 ROUTINE MEDICAL EXAM (primary encounter diagnosis)  Plan: ROUTINE VENIPUNCTURE, COMPLETE CBC W/AUTO DIFF    WBC, AUTOMATED RETICULOCYTE COUNT, IRON, IRON    BINDING TEST, ASSAY OF FERRITIN, VITAMIN B-12,    BLOOD FOLIC ACID SERUM, COMPREHENSIVE METABOLIC   PANEL, ASSAY, BLD/SERUM CHOLESTEROL, ASSAY OF    BLOOD LIPOPROTEIN, ASSAY OF LIPOPROTEIN      V76.12 SCREENING MAMM-MAILG NEOPL NEC  Plan: refer for mammo    368.8 VISUAL DISTURBANCES NEC  Plan: REFERRAL TO OPHTHALMOLOGY (INT)    521.00 UNSPEC DENTAL CARIES  Plan: referred to [facility]    610.0 BREAST CYSTS  Plan: REFERRAL TO [facility] ([facility])    627.0 MENORRHAGIA / ANEMIA  Plan: COMPLETE CBC W/AUTO DIFF WBC, AUTOMATED    RETICULOCYTE COUNT, IRON, IRON BINDING TEST,    REFERRAL TO GYNECOLOGY (INT)   - advised to take Fe twice daily    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

## 2006-06-01 LAB — CHG LIPOPROTEIN DIR MEAS HIGH DENSITY CHOLESTEROL: HIGH DENSITY LIPOPROTEIN: 36 mg/dl (ref 35–85)

## 2006-06-01 LAB — BLOOD COUNT COMPLETE AUTO&AUTO DIFRNTL WBC
BASOPHIL %: 0.5 % (ref 0.0–2.0)
EOSINOPHIL %: 0.9 % (ref 0.0–7.0)
HEMATOCRIT: 34.6 % — ABNORMAL LOW (ref 36.0–48.0)
HEMOGLOBIN: 11.7 g/dl — ABNORMAL LOW (ref 12.0–16.0)
LYMPHOCYTE %: 21.8 % (ref 13.0–39.0)
MEAN CORP HGB CONC: 33.8 g/dl (ref 32.0–36.0)
MEAN CORPUSCULAR HGB: 30.8 pg (ref 27.0–33.0)
MEAN CORPUSCULAR VOL: 91.2 fl (ref 80.0–100.0)
MEAN PLATELET VOLUME: 11.3 fl — ABNORMAL HIGH (ref 6.4–10.8)
MONOCYTE %: 6.5 % (ref 1.0–12.0)
NEUTROPHIL %: 70.3 % (ref 46.0–79.0)
PLATELET COUNT: 233 10*3/uL (ref 150–400)
RBC DISTRIBUTION WIDTH: 12.9 % (ref 11.5–14.3)
RED BLOOD CELL COUNT: 3.8 M/uL — ABNORMAL LOW (ref 4.50–5.10)
WHITE BLOOD CELL COUNT: 9.7 10*3/uL (ref 4.0–10.8)

## 2006-06-01 LAB — COMPREHENSIVE METABOLIC PANEL
ALANINE AMINOTRANSFERASE: 19 IU/L (ref 7–35)
ALBUMIN: 3.8 g/dl (ref 3.4–4.8)
ALKALINE PHOSPHATASE: 68 IU/L (ref 25–106)
ANION GAP: 3 mmol/L (ref 2–25)
ASPARTATE AMINOTRANSFERASE: 18 IU/L (ref 8–34)
BILIRUBIN TOTAL: 0.5 mg/dl (ref 0.2–1.1)
BUN (UREA NITROGEN): 10 mg/dl (ref 6–20)
CALCIUM: 9.1 mg/dl (ref 8.6–10.0)
CARBON DIOXIDE: 29 mmol/L (ref 22–32)
CHLORIDE: 106 mmol/L (ref 101–111)
CREATININE: 0.8 mg/dl (ref 0.4–1.2)
Glucose Random: 103 mg/dl (ref 74–160)
POTASSIUM: 4.3 mmol/L (ref 3.5–5.1)
SODIUM: 138 mmol/L (ref 135–144)
TOTAL PROTEIN: 7 g/dl (ref 5.9–7.5)

## 2006-06-01 LAB — HOLD PURPLE TOP TUBE

## 2006-06-01 LAB — CHG ASSAY OF FERRITIN: FERRITIN: 7 ng/mL — ABNORMAL LOW (ref 11–307)

## 2006-06-01 LAB — CHG LIPOPROTEIN DIRECT MEASUREMENT LDL CHOLESTEROL: LOW DENSITY LIPOPROTEIN DIRECT: 170 mg/dl — ABNORMAL HIGH (ref 0–100)

## 2006-06-01 LAB — CYANOCOBALAMIN VITAMIN B-12: VITAMIN B12: 497 pg/mL (ref 180–914)

## 2006-06-01 LAB — CHG ASSAY OF FOLIC ACID SERUM: FOLATE: 11.1 ng/mL (ref 3.0–?)

## 2006-06-01 LAB — CHG IRON BINDING CAPACITY: TOTAL IRON BIND CAPACITY CALC: 398 ug/dL (ref 282–427)

## 2006-06-01 LAB — CHOLESTEROL SERUM/WHOLE BLOOD TOTAL: Cholesterol: 221 mg/dl — ABNORMAL HIGH (ref 0–200)

## 2006-06-01 LAB — CHG BLOOD COUNT RETICULOCYTE AUTOMATED: RETICULOCYTE COUNT: 1.6 % (ref 0.5–2.0)

## 2006-06-01 LAB — IRON: IRON: 58 ug/dl (ref 28–170)

## 2006-06-01 NOTE — Progress Notes (Addendum)
Addended by: Casey Burkitt on: 06/01/2006 2:58:37 PM     Modules accepted: Orders

## 2006-06-05 ENCOUNTER — Encounter (HOSPITAL_BASED_OUTPATIENT_CLINIC_OR_DEPARTMENT_OTHER): Payer: Self-pay | Admitting: Internal Medicine

## 2006-06-05 DIAGNOSIS — E559 Vitamin D deficiency, unspecified: Principal | ICD-10-CM

## 2006-06-05 LAB — VITAMIN D,25 HYDROXY: VITAMIN D,25 HYDROXY: 15.1 ng/mL — AB (ref 32.0–100.0)

## 2006-06-05 MED ORDER — VITAMIN D 50000 UNIT OR CAPS
ORAL_CAPSULE | ORAL | Status: AC
Start: 2006-06-05 — End: 2006-08-28

## 2006-06-13 ENCOUNTER — Ambulatory Visit (HOSPITAL_BASED_OUTPATIENT_CLINIC_OR_DEPARTMENT_OTHER): Payer: Self-pay | Admitting: Nurse Practitioner

## 2006-06-13 DIAGNOSIS — N6009 Solitary cyst of unspecified breast: Secondary | ICD-10-CM

## 2006-06-13 DIAGNOSIS — N644 Mastodynia: Secondary | ICD-10-CM

## 2006-06-13 DIAGNOSIS — R928 Other abnormal and inconclusive findings on diagnostic imaging of breast: Secondary | ICD-10-CM

## 2006-06-13 LAB — MA DIAGNOSTIC MAMMO BILATERAL WITH CAD

## 2006-06-13 LAB — US BREAST BILATERAL

## 2006-06-18 ENCOUNTER — Other Ambulatory Visit (HOSPITAL_BASED_OUTPATIENT_CLINIC_OR_DEPARTMENT_OTHER): Payer: Self-pay | Admitting: Ophthalmology

## 2006-06-22 LAB — TCH BREAST CTR CLINIC NOTE

## 2006-06-23 ENCOUNTER — Encounter (HOSPITAL_BASED_OUTPATIENT_CLINIC_OR_DEPARTMENT_OTHER): Payer: Self-pay | Admitting: Internal Medicine

## 2006-08-17 ENCOUNTER — Telehealth (HOSPITAL_BASED_OUTPATIENT_CLINIC_OR_DEPARTMENT_OTHER): Payer: Self-pay | Admitting: Ambulatory Care

## 2006-08-17 ENCOUNTER — Ambulatory Visit (HOSPITAL_BASED_OUTPATIENT_CLINIC_OR_DEPARTMENT_OTHER): Payer: PRIVATE HEALTH INSURANCE

## 2006-08-17 VITALS — BP 122/72 | Temp 98.4°F | Wt 139.0 lb

## 2006-08-17 DIAGNOSIS — M25569 Pain in unspecified knee: Secondary | ICD-10-CM

## 2006-08-17 MED ORDER — MOTRIN 800 MG PO TABS
ORAL_TABLET | ORAL | Status: AC
Start: 2006-08-17 — End: 2006-09-16

## 2006-08-17 NOTE — Progress Notes (Signed)
Due to the language barrier, the office visit was conducted in [place] with an interpreter. The interpreter's name is Mikle Bosworth. The interpreter was present during the entire visit.    SUBJECTIVE: Tina [name] is a 51 year old female presents for knee problems. Pt states that she is having for now 3 weeks having pain. She states in the past fell in her kitchen and hurt her knee about 2 years ago.     Right knee pain present for 3 weeks, intermittent, worse with certain movements, walking or running, feels like a pulling pain and sometimes may lock on her. Will be awakened by the pain. No medication attempted for the pain. Intensity is a 8 to 10/10 in intensity. Little pain at rest: 3/10    Takes 800 mg of ibuprofen once when the pain is strong, no relief with the medication.     Denies any swelling or erythema or warmth.    Patient Active Problem List:   BREAST CYSTS [610.0]   JOINT PAIN-L/LEG [719.46]   MENORRHAGIA / ANEMIA [627.0]   CONDYLOMA [078.11]   HYPERCHOLESTEROLEMIA [272.0]    Current outpatient prescriptions prior to encounter:  FLONASE 50 MCG/DOSE NA INHA, 2 sprays in each nostril daily, Disp: 1, Rfl: 5  IBUPROFEN 200 MG OR TABS, 2 TABLETS EVERY 8 HOURS AS NEEDED, Disp: 100, Rfl: 0  IRON 325 MG OR TABS, 1 TABLET DAILY, Disp: 30, Rfl: 0  VITAMIN D 16109 UNIT OR CAPS, 1 CAPSULE WEEKLY, Disp: 12, Rfl: 0    Review of Patient's Allergies indicates:   Nkda (no known drug*     OBJECTIVE: Tina [name] is a 51 year old female in no apparent distress with BP 122/72  Temp (Src) 98.4 F (36.9 C) (Oral)  Wt 139 lb (63.05 kg)   Knee exam: right positive for moderate crepitations, some mild tenderness and pain on range of motion, no effusion is present, no pseudolaxity noted.    ASSESSMENT/PLAN:  719.46B Pain in Knee Joint (primary encounter diagnosis)  Comment: possible DJD vs. Meniscus problems, will treat conservatively for now with motrin around the clock for 3 days, pt advised to always take with food or milk.    Plan: MOTRIN 800 MG OR TABS   follow up with Casey Burkitt, MD in 2 to 3 weeks.

## 2006-08-17 NOTE — Telephone Encounter (Signed)
Staff Message copied by Windell Moulding on Fri Aug 17, 2006 11:44 AM  ------   Message from: Tanna Savoy   Created: Fri Aug 17, 2006 11:41 AM   Regarding: sick knee pain    Tina [name] [id number], 51 year old, female, Telephone Information:  Home Phone [phone]  Work Phone [phone]  Mobile [phone]      Cleotis Lema NUMBER: [phone]  Cell phone:   Other phone:    Available times:    Patient's language of care: [place]    Patient needs a [place] interpreter.    Patient's PCP: Casey Burkitt, MD    Person calling on behalf of patient: patient (self)    Calls today with a sick call. She having a lot pain on r knee    Patient's Preferred Pharmacy:   No Pharmacies Listed

## 2006-08-17 NOTE — Telephone Encounter (Signed)
Called pt with [facility] interpreter [place]. C/o bilateral knee pain although one is worse. Feels a painful pulling sensation in her knee. States had 2 yrs ago and was seen by orthopedics.Worsening over last two weeks. Per Dr.Doppelt old note, was to have MRI but patient never went. That visit was for opposite knee which is now "the better knee". Hx fell down onto her knee in the kitchen. Can only come on Fridays- scheduled with Dr.Parisien today to eval for this problem.

## 2006-08-29 ENCOUNTER — Encounter (HOSPITAL_BASED_OUTPATIENT_CLINIC_OR_DEPARTMENT_OTHER): Payer: PRIVATE HEALTH INSURANCE | Admitting: Obstetrics & Gynecology

## 2006-08-31 ENCOUNTER — Other Ambulatory Visit (HOSPITAL_BASED_OUTPATIENT_CLINIC_OR_DEPARTMENT_OTHER): Payer: PRIVATE HEALTH INSURANCE | Admitting: Internal Medicine

## 2006-09-12 ENCOUNTER — Other Ambulatory Visit (HOSPITAL_BASED_OUTPATIENT_CLINIC_OR_DEPARTMENT_OTHER): Payer: PRIVATE HEALTH INSURANCE | Admitting: Internal Medicine

## 2006-11-09 ENCOUNTER — Encounter (HOSPITAL_BASED_OUTPATIENT_CLINIC_OR_DEPARTMENT_OTHER): Payer: PRIVATE HEALTH INSURANCE | Admitting: Obstetrics & Gynecology

## 2006-12-07 ENCOUNTER — Ambulatory Visit (HOSPITAL_BASED_OUTPATIENT_CLINIC_OR_DEPARTMENT_OTHER): Payer: Self-pay | Admitting: Nurse Practitioner

## 2006-12-07 DIAGNOSIS — N644 Mastodynia: Principal | ICD-10-CM

## 2006-12-07 DIAGNOSIS — N6009 Solitary cyst of unspecified breast: Secondary | ICD-10-CM

## 2006-12-10 ENCOUNTER — Ambulatory Visit (HOSPITAL_BASED_OUTPATIENT_CLINIC_OR_DEPARTMENT_OTHER): Payer: Self-pay | Admitting: Nurse Practitioner

## 2006-12-10 ENCOUNTER — Encounter (HOSPITAL_BASED_OUTPATIENT_CLINIC_OR_DEPARTMENT_OTHER): Payer: Self-pay | Admitting: Internal Medicine

## 2006-12-10 LAB — TCH BREAST CTR CLINIC NOTE

## 2006-12-11 LAB — US CYST ASPIRATION BREAST LEFT

## 2006-12-11 LAB — US BREAST-AXILLA LEFT

## 2006-12-21 ENCOUNTER — Ambulatory Visit (HOSPITAL_BASED_OUTPATIENT_CLINIC_OR_DEPARTMENT_OTHER): Payer: Self-pay | Admitting: Nurse Practitioner

## 2006-12-21 DIAGNOSIS — N6009 Solitary cyst of unspecified breast: Principal | ICD-10-CM

## 2006-12-24 LAB — TCH BREAST CTR CLINIC NOTE

## 2007-02-01 ENCOUNTER — Ambulatory Visit (HOSPITAL_BASED_OUTPATIENT_CLINIC_OR_DEPARTMENT_OTHER): Payer: PRIVATE HEALTH INSURANCE | Admitting: Obstetrics & Gynecology

## 2007-04-26 ENCOUNTER — Encounter (HOSPITAL_BASED_OUTPATIENT_CLINIC_OR_DEPARTMENT_OTHER): Payer: Self-pay | Admitting: Obstetrics & Gynecology

## 2007-04-26 ENCOUNTER — Ambulatory Visit (HOSPITAL_BASED_OUTPATIENT_CLINIC_OR_DEPARTMENT_OTHER): Payer: PRIVATE HEALTH INSURANCE | Admitting: Obstetrics & Gynecology

## 2007-04-26 VITALS — BP 106/70 | Wt 139.0 lb

## 2007-04-26 DIAGNOSIS — Z01419 Encounter for gynecological examination (general) (routine) without abnormal findings: Principal | ICD-10-CM

## 2007-04-26 DIAGNOSIS — N920 Excessive and frequent menstruation with regular cycle: Secondary | ICD-10-CM

## 2007-04-26 LAB — URINE PREGNANCY TEST (POINT OF CARE): HCG QUALITATIVE URINE: NEGATIVE

## 2007-04-26 NOTE — Progress Notes (Signed)
S/ Here for a routine exam.  Referred by Dr. Elvis Coil.  She has already had a general exam by her PCP and is followed by another doctor for a breast problem.  She has a H/O breast boils. Wants to be checked for iron because she has a history of anemia due to menorrhagia.  Feeling tired.  Having hot flashes helped by OTC soy.  Has not had recent screening blood work done.    Cat: 11 X Q mo X 7-12 days.  Along time ago started with menometrorrhagia.  Had hysteroscopies in [place] three times for this problem.  Told the endometrium was too thick and had to be cleaned out.  Dr. Domingo Cocking did tests for cancer which were benign.  Was offered a procedure by Dr. Domingo Cocking but pt unable to get time off from work at that time.  Now willing to reconsider a procedure.  Currently on PO iron for anemia.  Recently has been feeling weak.  Has not been checked for low iron recently.  Also has dysmenorrhea.  More recently menses starts and stops.  Can have up to three menses a month.    Review of Patient's Allergies indicates:   Nkda (no known drug*        Current outpatient prescriptions:  FLONASE 50 MCG/DOSE NA INHA 2 sprays in each nostril daily Disp: 1 Rfl: 5   IBUPROFEN 200 MG OR TABS 2 TABLETS EVERY 8 HOURS AS NEEDED Disp: 100 Rfl: 0   IRON 325 MG OR TABS 1 TABLET DAILY Disp: 30 Rfl: 0       Obstetric History    G4  P4  T4  P0  TAB0  SAB0  E0  M0  L4     NSVD X 3 followed by C/S.   Comment: C/S done because baby was large and had hemorrhaging with prior deliveries.      Past Medical History    BREAST CYSTS 12/17/2003    MENORRHAGIA / ANEMIA 11/23/2004    Comment: 2004    CONDYLOMA 11/23/2004         Past Surgical History    APPENDECTOMY 2000    Comment: laparoscopy    BIOPSY OF UTERUS LINING 9/04    Comment: endometrial bx = negative    CESAREAN DELIVERY     Comment: tubal ligation    HYSTEROSCOPY, BIOPSY     Comment: 3 times in [place]         Social History   Marital Status: Married  Spouse Name: N/A    Years of Education: N/A  Number  of Children: N/A     Occupational History  Babysitting  Works daytime          Social History Main Topics   Tobacco Use: Never    Alcohol Use: No    Drug Use: No    Sexually Active: Yes  Partner(s): Female    Pharmacist, hospital Protection: Surgical       Other Topics Concern   None     Social History Narrative    [place] - 2003 -     Lives with husband;    4 grown children are in [place]    2 grandchildren       Family History    Hypertension Mother    Hypertension Father    Cancer - Breast Paternal Aunt    Cancer - Prostate Paternal Grandfather    Cancer - Other Paternal Aunt    Comment: skin cancer  O/ external genitalia normal.  Vagina and cervix appear normal.  Scant menstral blood present.  13 week size anteverted uterus without adenexal masses.  Pap done.  EMB done without difficulty.    A/  1. RHM    2.  Long history of menomet causing anemia for which the pt takes PO Fe.  Recently has been feeling weak.  S/P three procedures in [place] for this.  Was advised to have a D&C, hysteroscopy by Dr. Domingo Cocking in 2004.  At that time had an U/S which revealed a question of a submucosal fibroid and a polyp.  Benign EMB 2004.    P/ 1. Check pap done today.  Future orders placed for fasting lipid panal and glucose.  Due for mammo 1/09.    2.  Check EMB done today.  Pelvic U/S ordered.  F/U after U/S.  Consider D&C/hysteroscopy.  Alternatively could consider lupron since she may be approaching menopause.  Lupron does not always work well for submucosal fibroids.  Will check CBC and FSH with future labs.  If EMB benign could continue with ibuprofen for dysmenorrhea and PO Fe for anemia.

## 2007-04-26 NOTE — Progress Notes (Signed)
Due to the language barrier, the office visit was conducted in Portuguese with an interpreter. The interpreter was present during the entire visit.

## 2007-04-29 LAB — SURGICAL PATH SPECIMEN

## 2007-05-03 ENCOUNTER — Ambulatory Visit: Payer: Self-pay | Admitting: Obstetrics & Gynecology

## 2007-05-03 ENCOUNTER — Ambulatory Visit (HOSPITAL_BASED_OUTPATIENT_CLINIC_OR_DEPARTMENT_OTHER): Payer: PRIVATE HEALTH INSURANCE

## 2007-05-03 DIAGNOSIS — Z01419 Encounter for gynecological examination (general) (routine) without abnormal findings: Secondary | ICD-10-CM

## 2007-05-03 DIAGNOSIS — N920 Excessive and frequent menstruation with regular cycle: Secondary | ICD-10-CM

## 2007-05-03 LAB — CHG LIPID PANEL
Cholesterol: 222 mg/dl — ABNORMAL HIGH (ref 0–200)
HIGH DENSITY LIPOPROTEIN: 50 mg/dl (ref 35–85)
LOW DENSITY LIPOPROTEIN DIRECT: 174 mg/dl — ABNORMAL HIGH (ref 0–100)
RISK FACTOR: 4.4 (ref ?–4.4)
TRIGLYCERIDES: 48 mg/dl (ref 0–150)

## 2007-05-03 LAB — BLOOD COUNT COMPLETE AUTOMATED
HEMATOCRIT: 38.4 % (ref 36.0–48.0)
HEMOGLOBIN: 13.3 g/dl (ref 12.0–16.0)
MEAN CORP HGB CONC: 34.5 g/dl (ref 32.0–36.0)
MEAN CORPUSCULAR HGB: 32.7 pg (ref 27.0–33.0)
MEAN CORPUSCULAR VOL: 94.8 fl (ref 80.0–100.0)
MEAN PLATELET VOLUME: 10.8 fl (ref 6.4–10.8)
PLATELET COUNT: 217 10*3/uL (ref 150–400)
RBC DISTRIBUTION WIDTH: 12.1 % (ref 11.5–14.3)
RED BLOOD CELL COUNT: 4.05 M/uL — ABNORMAL LOW (ref 4.50–5.10)
WHITE BLOOD CELL COUNT: 6.5 10*3/uL (ref 4.0–10.8)

## 2007-05-03 LAB — US TRANSVAGINAL NON-OB

## 2007-05-03 LAB — GLUCOSE FASTING: GLUCOSE FASTING: 120 mg/dl — ABNORMAL HIGH (ref 74–118)

## 2007-05-03 LAB — US 3D RENDERING WO DEDICATED WORKSTATION

## 2007-05-03 LAB — US PELVIC NON-PREGNANT

## 2007-05-03 LAB — CHG GONADOTROPIN FOLLICLE STIMULATING HORMONE: FOLLICLE STIMULATING HORMONE: 30.86 m[IU]/mL

## 2007-05-05 ENCOUNTER — Telehealth (HOSPITAL_BASED_OUTPATIENT_CLINIC_OR_DEPARTMENT_OTHER): Payer: Self-pay | Admitting: Obstetrics & Gynecology

## 2007-05-05 DIAGNOSIS — R7301 Impaired fasting glucose: Principal | ICD-10-CM

## 2007-05-05 NOTE — Telephone Encounter (Signed)
Please have this pt make an appointment with her PCP to address an elevated FBS.    Cc Richard Elvis Coil

## 2007-05-06 NOTE — Telephone Encounter (Signed)
Due to the language barrier, the phone call was conducted in Portuguese with an interpreter. The interpreter's name is Anita. The interpreter was on the phone during the entire phone call.      TC to pt Left message asking pt to call [facility] back and ask to speak to a nurse.

## 2007-05-06 NOTE — Telephone Encounter (Signed)
Please call patient to come in for FASTING bloodwork. When she comes in she should schedule an appt to see me one week later.   Thanks,   Luan Pulling

## 2007-05-07 NOTE — Telephone Encounter (Signed)
Staff Message copied by Ardeen Fillers on Tue May 07, 2007 9:09 AM  ------   Message from: Encarnacion Chu   Created: Tue May 07, 2007 8:56 AM   Regarding: ret call   Contact: [phone]    Fawne Hughley [id number], 51 year old, female, Telephone Information:  Home Phone [phone]  Work Phone [phone]  Mobile [phone]      CALL BACK NUMBER: home  Cell phone:   Other phone:    Available times:    Patient's language of care: [place]    Patient needs a [place] interpreter.    Patient's PCP: Casey Burkitt, MD    Person calling on behalf of patient: Patient (self)    Calls today returning phone call. From yesterday.

## 2007-05-07 NOTE — Telephone Encounter (Signed)
TC pt via [employer] portuguese interp    Advised pt per Dr.Balaban to have fasting bloodwork done and then make appt to see him one week later.  Pt advised to call Dr.Balaban office to schedule blood work and make appt.  Pt agrees with plan.    Forwarded to [facility] RN, Dr.Balaban, and Dr.Chaudhuri

## 2007-05-14 LAB — CYTOPATH, C/V, THIN LAYER

## 2007-05-14 NOTE — Progress Notes (Signed)
Labs drawn

## 2007-05-23 LAB — HUMAN PAPILLOMAVIRUS (HPV): HUMAN PAPILLOMAVIRUS: NEGATIVE

## 2007-05-24 ENCOUNTER — Ambulatory Visit (HOSPITAL_BASED_OUTPATIENT_CLINIC_OR_DEPARTMENT_OTHER): Payer: PRIVATE HEALTH INSURANCE | Admitting: Obstetrics & Gynecology

## 2007-06-07 ENCOUNTER — Ambulatory Visit (HOSPITAL_BASED_OUTPATIENT_CLINIC_OR_DEPARTMENT_OTHER): Payer: PRIVATE HEALTH INSURANCE | Admitting: Obstetrics & Gynecology

## 2007-06-14 ENCOUNTER — Ambulatory Visit (HOSPITAL_BASED_OUTPATIENT_CLINIC_OR_DEPARTMENT_OTHER): Payer: Self-pay | Admitting: Nurse Practitioner

## 2007-07-05 ENCOUNTER — Ambulatory Visit (HOSPITAL_BASED_OUTPATIENT_CLINIC_OR_DEPARTMENT_OTHER): Payer: PRIVATE HEALTH INSURANCE | Admitting: Obstetrics & Gynecology

## 2007-07-05 ENCOUNTER — Ambulatory Visit (HOSPITAL_BASED_OUTPATIENT_CLINIC_OR_DEPARTMENT_OTHER): Payer: Self-pay | Admitting: Nurse Practitioner

## 2007-08-02 ENCOUNTER — Ambulatory Visit (HOSPITAL_BASED_OUTPATIENT_CLINIC_OR_DEPARTMENT_OTHER): Payer: Self-pay | Admitting: Nurse Practitioner

## 2007-08-02 LAB — MA DIAGNOSTIC MAMMO BILATERAL WITH CAD

## 2007-08-02 LAB — TCH BREAST CTR CLINIC NOTE

## 2007-08-09 ENCOUNTER — Ambulatory Visit (HOSPITAL_BASED_OUTPATIENT_CLINIC_OR_DEPARTMENT_OTHER): Payer: Self-pay | Admitting: Nurse Practitioner

## 2007-08-09 DIAGNOSIS — N6009 Solitary cyst of unspecified breast: Secondary | ICD-10-CM

## 2007-08-12 LAB — TCH BREAST CTR CLINIC NOTE

## 2007-10-04 ENCOUNTER — Ambulatory Visit (HOSPITAL_BASED_OUTPATIENT_CLINIC_OR_DEPARTMENT_OTHER): Payer: Self-pay

## 2007-11-01 ENCOUNTER — Ambulatory Visit (HOSPITAL_BASED_OUTPATIENT_CLINIC_OR_DEPARTMENT_OTHER): Payer: Self-pay

## 2007-11-08 ENCOUNTER — Ambulatory Visit (HOSPITAL_BASED_OUTPATIENT_CLINIC_OR_DEPARTMENT_OTHER): Payer: Self-pay | Admitting: Dentistry

## 2007-11-18 ENCOUNTER — Ambulatory Visit (HOSPITAL_BASED_OUTPATIENT_CLINIC_OR_DEPARTMENT_OTHER): Payer: Self-pay | Admitting: Dentistry

## 2007-11-18 DIAGNOSIS — Z0120 Encounter for dental examination and cleaning without abnormal findings: Secondary | ICD-10-CM

## 2007-11-19 ENCOUNTER — Ambulatory Visit (HOSPITAL_BASED_OUTPATIENT_CLINIC_OR_DEPARTMENT_OTHER): Payer: Self-pay | Admitting: Dentistry

## 2007-11-19 DIAGNOSIS — Z0120 Encounter for dental examination and cleaning without abnormal findings: Secondary | ICD-10-CM

## 2008-01-03 ENCOUNTER — Ambulatory Visit (HOSPITAL_BASED_OUTPATIENT_CLINIC_OR_DEPARTMENT_OTHER): Payer: Self-pay | Admitting: Dentistry

## 2008-01-31 ENCOUNTER — Ambulatory Visit (HOSPITAL_BASED_OUTPATIENT_CLINIC_OR_DEPARTMENT_OTHER): Payer: Self-pay | Admitting: Dentistry

## 2008-02-07 ENCOUNTER — Ambulatory Visit (HOSPITAL_BASED_OUTPATIENT_CLINIC_OR_DEPARTMENT_OTHER): Payer: Self-pay | Admitting: Nurse Practitioner

## 2008-02-14 LAB — TCH BREAST CTR CLINIC NOTE

## 2008-03-20 ENCOUNTER — Ambulatory Visit (HOSPITAL_BASED_OUTPATIENT_CLINIC_OR_DEPARTMENT_OTHER): Payer: PRIVATE HEALTH INSURANCE | Admitting: Internal Medicine

## 2008-03-20 VITALS — BP 100/60 | HR 85 | Temp 97.6°F | Wt 144.0 lb

## 2008-03-20 DIAGNOSIS — H6090 Unspecified otitis externa, unspecified ear: Secondary | ICD-10-CM

## 2008-03-20 DIAGNOSIS — J329 Chronic sinusitis, unspecified: Principal | ICD-10-CM

## 2008-03-20 MED ORDER — CORTISPORIN 3.5-10000-1 OT SUSP
OTIC | Status: AC
Start: 2008-03-20 — End: 2008-03-27

## 2008-03-20 MED ORDER — AZITHROMYCIN 250 MG PO TABS
ORAL_TABLET | ORAL | Status: AC
Start: 2008-03-20 — End: 2008-03-27

## 2008-03-20 NOTE — Progress Notes (Signed)
SUBJECTIVE: Via phone interpreter    Tina [name] is a 52 y.o who presents to clinic with c/c of headaches, nasal congestion, sinus pressure, right earache, fever of 38C, 100.52F, body aches, productive cough with yellow sputum x 5 days.     + sick contact- baby sister 2 young children with URI symptoms    Has taken OTC decongestant with some effect    Denies fevers, chills, body aches, chest pain, SOB, n/v, abdominal pain.     Patient Active Problem List:     BREAST CYSTS [610.0]     MENORRHAGIA / ANEMIA [627.0]     CONDYLOMA [078.11]     HYPERCHOLESTEROLEMIA [272.0]      Past Medical History    BREAST CYSTS 12/17/2003    MENORRHAGIA / ANEMIA 11/23/2004    Comment: 2004    CONDYLOMA 11/23/2004         Past Surgical History    APPENDEC 2000    Comment: laparoscopy    ENDOMETRIAL BX +-ENDOCRV BX W/O DILAT SPX 9/04    Comment: endometrial bx = negative    OB ANTEPARTUM CARE C DLVR&POSTPARTUM     Comment: tubal ligation    HYSTSC BX ENDOMETRIUM&/POLYPC +-D&C     Comment: 3 times in [place]         Current outpatient prescriptions prior to encounter:  FLONASE 50 MCG/DOSE NA INHA 2 sprays in each nostril daily Disp: 1 Rfl: 5   IBUPROFEN 200 MG OR TABS 2 TABLETS EVERY 8 HOURS AS NEEDED Disp: 100 Rfl: 0   IRON 325 MG OR TABS 1 TABLET DAILY Disp: 30 Rfl: 0       OBJECTIVE:  General: A+0x3,NAD  BP 100/60  Pulse 85  Temp (Src) 97.6 F (36.4 C) (Oral)  Wt 144 lb (65.318 kg)  SpO2 100%  HEENT: NT/AT PERRLA, no conjunctivial injection noted,+ frontal and maxillary sinus tenderness noted on palpation. Nares patent, no erythema noted, no nasal drainage,discharge, lesions noted. Right external canal with moderate erythema noted TM intact, no perforation or effusion noted, Left TM normal without perforation or effusion, Throat clear,MMM, no exudate noted.  NECK: supple, no nodules, negative lymphadenopathy  CHEST: Clear to auscultation, no wheezes or rales.  CV: RRR, S1, S2 WNL, no murmurs      ASSESSMENT/PLAN:  473.9E Sinusitis   (primary encounter diagnosis)  Comment:   Plan: AZITHROMYCIN 250 MG OR TABS      380.4F Otitis Externa  Comment:   Plan: CORTISPORIN 3.5-10000-1 OT SUSP

## 2008-03-27 ENCOUNTER — Ambulatory Visit (HOSPITAL_BASED_OUTPATIENT_CLINIC_OR_DEPARTMENT_OTHER): Payer: PRIVATE HEALTH INSURANCE | Admitting: Obstetrics & Gynecology

## 2008-03-27 VITALS — BP 110/70 | Wt 140.5 lb

## 2008-03-27 DIAGNOSIS — N926 Irregular menstruation, unspecified: Principal | ICD-10-CM

## 2008-03-27 LAB — URINE PREGNANCY TEST (POINT OF CARE): HCG QUALITATIVE URINE: NEGATIVE

## 2008-03-27 MED ORDER — FLONASE 50 MCG/DOSE NA INHA
NASAL | Status: DC
Start: 2008-03-27 — End: 2008-03-27

## 2008-03-27 MED ORDER — FLONASE 50 MCG/DOSE NA INHA
NASAL | Status: DC
Start: 2008-03-27 — End: 2008-11-13

## 2008-03-27 NOTE — Progress Notes (Signed)
S/  Noncompliant with F/U because she was afraid and her health insurance was inactive for 6 months.  She was afraid because of the problem with the bleeding.  Then she was happy because she went two months without bleeding.  That was Aug and Sept.  But then started bleeing again.  She was afraid to have surgery.  She cannot remember when she started bleeding but she was bleeding for 10 days and the bleeding stopped two weeks ago.  Having a lot of hot flashes which started last year.  She is taking soy suppliments which is very helpful.  It hasn't been as bad as it was a year ago.  She uses lecethin with soy. Has dysmenorrhea.  Hs a H/O chronic menometrorrhagia.  Was on hormones to control menses many years ago.  She developed breast cysts while on the hormones.  Has a H/O iron deficiency anemia.  Wants to be tested to see if her iron is low.      O/  Pap 12/08 ASCUS, HPV neg    External genitalia normal.  BUS neg.  Vagina and cervix appear normal.  Nontender, 13 week size anteverted uterus.  No adenexal masses or tenderness.  EMB done without difficulty.    A/  Postmenopausal bleeding in a 52 Y/O with a history of chornic menometrorrhagia.  Benign EMB 12/08.  Hot flashes for over a year.  FSH in the postmenopausal range 12/08.  Enlarged uterus on exam today.  Same size as on pelvic exam 12/08.  Uterus was normal size on U/S done 12/08.  That U/S revealed a 1.2 cm endometrial polyp.  Pt then lost to F/U. H/O three NSVDs followed by one C/S.  Uterus may be enlarged on pelivic exam due to pelvic adhesive disease or adenomyosis S/P C/S.  H/O iron deficiancy anemia.    P/  1.  Check CBC and ferritin.  Note:  Pt left without having these drawn.  Will order again at the next visit.    2.  Check EMB done today.    3.  U/S ordered.    4.  F/U after U/S.

## 2008-03-27 NOTE — Progress Notes (Signed)
PATIENT/PROCEDURE VERIFICATION DOCUMENTATION    Correct patient: Yes  Correct procedure: Yes  Correct side, site, mark visible if applicable: Yes  Correct position: Yes  Special equipment/implant(s) present, if applicable: Yes    Time-out completed, documented by provider doing procedure or designated team member:  Lollie Sails    03/27/2008    2:26 PM

## 2008-04-01 LAB — SURGICAL PATH SPECIMEN

## 2008-04-10 ENCOUNTER — Ambulatory Visit: Payer: Self-pay | Admitting: Obstetrics & Gynecology

## 2008-04-12 LAB — US 3D RENDERING WO DEDICATED WORKSTATION

## 2008-04-12 LAB — US PELVIC NON-PREGNANT

## 2008-04-12 LAB — US TRANSVAGINAL NON-OB

## 2008-04-17 ENCOUNTER — Ambulatory Visit (HOSPITAL_BASED_OUTPATIENT_CLINIC_OR_DEPARTMENT_OTHER): Payer: Self-pay | Admitting: Obstetrics & Gynecology

## 2008-04-17 LAB — BLOOD COUNT COMPLETE AUTOMATED
HEMATOCRIT: 37.1 % (ref 36.0–48.0)
HEMOGLOBIN: 12.7 g/dl (ref 12.0–16.0)
MEAN CORP HGB CONC: 34.3 g/dl (ref 32.0–36.0)
MEAN CORPUSCULAR HGB: 32.8 pg (ref 27.0–33.0)
MEAN CORPUSCULAR VOL: 95.6 fl (ref 80.0–100.0)
MEAN PLATELET VOLUME: 10.6 fl (ref 6.4–10.8)
PLATELET COUNT: 187 10*3/uL (ref 150–400)
RBC DISTRIBUTION WIDTH: 12.3 % (ref 11.5–14.3)
RED BLOOD CELL COUNT: 3.88 M/uL — ABNORMAL LOW (ref 4.50–5.10)
WHITE BLOOD CELL COUNT: 5.3 10*3/uL (ref 4.0–10.8)

## 2008-04-17 LAB — CHG ASSAY OF FERRITIN: FERRITIN: 9 ng/mL — ABNORMAL LOW (ref 11–307)

## 2008-04-24 ENCOUNTER — Ambulatory Visit (HOSPITAL_BASED_OUTPATIENT_CLINIC_OR_DEPARTMENT_OTHER): Payer: PRIVATE HEALTH INSURANCE | Admitting: Obstetrics & Gynecology

## 2008-04-24 VITALS — BP 130/80 | Wt 141.5 lb

## 2008-04-24 DIAGNOSIS — Z124 Encounter for screening for malignant neoplasm of cervix: Secondary | ICD-10-CM

## 2008-04-24 DIAGNOSIS — N920 Excessive and frequent menstruation with regular cycle: Secondary | ICD-10-CM

## 2008-04-24 MED ORDER — DEPO-PROVERA 150 MG/ML IM SUSP
INTRAMUSCULAR | Status: DC
Start: 2008-04-24 — End: 2008-10-16

## 2008-04-24 NOTE — Progress Notes (Signed)
DMPA 150mg IM given per Dr.Chaudhuri. Refer to epic injections

## 2008-04-24 NOTE — Progress Notes (Signed)
S/  Here in F/U regarding heavy irregular vaginal bleeding.  Cat:  11 x Q month x 8-14 days.  Always had problems with heavy bleeding.  Was on various types of medication in [place].  She does not know which medications she was on.  The bleeding got worse about 2 years ago.  She saw Dr. Domingo Cocking who gave her an injection of depo-Provera after which she had no bleeding for 2 to 3 months.  Then the heavy irregular bleeding returned.  She did not come in for more injections because she was afraid the depo-Provera would cause breast cancer.  She has so many cysts in her breasts the doctor cannot count them all.  She started getting hot flashes about 1-2 years ago.  She stopped taking PO iron because it was making her nauseous.    O/  04/17/08 labs:  HCT = 37.1, ferritin = 9    EMB done 03/27/08:  Proliferative endometrium    U/S done 04/12/08:  Several small fibroids,  Endometrial stripe = 10 mm    12/08 pap ASCUS, HPV neg    3/09 benign mammo    FSH 30.86 on 12/08    A/  1.  Perimenopausal bleeding with proliferative endometrium on EMB.  Endometrial stripe thickened for a postmenopausal pt.  Good results with depo-provera previously.    2.  RHM    P/  1.  Offered treatment with progesterone agents, depo-Provera, PO provera, or Mirena.  Pt opted for depo-Provera but may consider mirena in the future so that she will not have to come in every three months.  Will recheck endometrial stripe by U/S in 6 months and consider repeat endometrial sampling if still thickened.  F/U in two months to consider a repeat depo-Provera at that time since the pt started with menomet less than 3 months after the last injection.  Explained that depo-provera is not associated with an increased risk of breast cancer.  Explained risk of osteopenia with prolonged use.  Pt understands and wants the depo-Provera.    2.  Check pap and HPV done today.

## 2008-04-30 LAB — CYTOPATH, C/V, THIN LAYER

## 2008-05-04 LAB — HUMAN PAPILLOMAVIRUS (HPV): HUMAN PAPILLOMAVIRUS: NEGATIVE

## 2008-05-05 ENCOUNTER — Encounter (HOSPITAL_BASED_OUTPATIENT_CLINIC_OR_DEPARTMENT_OTHER): Payer: Self-pay | Admitting: Obstetrics & Gynecology

## 2008-06-19 ENCOUNTER — Ambulatory Visit (HOSPITAL_BASED_OUTPATIENT_CLINIC_OR_DEPARTMENT_OTHER): Payer: PRIVATE HEALTH INSURANCE | Admitting: Nurse Practitioner

## 2008-06-19 ENCOUNTER — Encounter (HOSPITAL_BASED_OUTPATIENT_CLINIC_OR_DEPARTMENT_OTHER): Payer: Self-pay | Admitting: Internal Medicine

## 2008-06-19 DIAGNOSIS — N6009 Solitary cyst of unspecified breast: Secondary | ICD-10-CM

## 2008-06-23 LAB — TCH BREAST CTR CLINIC NOTE

## 2008-07-10 ENCOUNTER — Ambulatory Visit (HOSPITAL_BASED_OUTPATIENT_CLINIC_OR_DEPARTMENT_OTHER): Payer: PRIVATE HEALTH INSURANCE | Admitting: Obstetrics & Gynecology

## 2008-07-10 ENCOUNTER — Ambulatory Visit (HOSPITAL_BASED_OUTPATIENT_CLINIC_OR_DEPARTMENT_OTHER): Payer: PRIVATE HEALTH INSURANCE | Admitting: Nurse Practitioner

## 2008-07-10 VITALS — BP 120/70 | Wt 140.0 lb

## 2008-07-10 DIAGNOSIS — Z3009 Encounter for other general counseling and advice on contraception: Secondary | ICD-10-CM

## 2008-07-10 DIAGNOSIS — Z23 Encounter for immunization: Secondary | ICD-10-CM

## 2008-07-10 LAB — URINE PREGNANCY TEST (POINT OF CARE): HCG QUALITATIVE URINE: NEGATIVE

## 2008-07-10 MED ORDER — DEPO-PROVERA 150 MG/ML IM SUSP
INTRAMUSCULAR | Status: DC
Start: 2008-07-10 — End: 2008-10-16

## 2008-07-10 NOTE — Progress Notes (Signed)
Due to the language barrier, the office visit was conducted in [place] with an interpreter. The interpreter's name is lucias. The interpreter was present during the entire visit.

## 2008-07-10 NOTE — Progress Notes (Signed)
S/  Cramping and bleeing lasted from 05/19/08 to 06/08/08.  Now just spotting and cramping.  Ibuprofen helps with the cramping.  She also uses ibuprofen for headaches.  She is worried about her stomach because she generally takes ibuprofen three times a day.  She just wants her period to end.  Her daughter is a doctor and told her that if she continue with the depo-Provera the response may improve.  So she would really prefer to give the depo-Provera more time.  She is also concerned that the depo-Provera caused her recent breast cysts.    She has always had heavy periods.  They did not get irregular until started depo-Provera.    Already booked for a mammogram next month.    O/  Last dose of depo-Provera was 04/24/08.  The prior dose was 01/02/05.    EMB done 11/09 benign.    U/S done 11/09:  Multiple small fibroids.  Endometrial stripe = 10mm.  Normal appearing ovaries.    FSH done 12/08 = 30.86    A/  Long H/O menorrhagia in a pt who is noncompliant with F/U.  According to Dr. Merceda Elks note from 2006 the pt had an U/S which revealed a possible endometrial polyp and submucous fiborid.  The pt was supposed to have a D&C/hysteroscopy but did not.  She was given one injection of depo-Provera which she did well on but did not F/U for further injections until recently.  FSH in the postmenopausal range in 2008.  Proliferative endometrium on EMB done 11/09.  Has failed multiple medications for menorrhagia in [place].    P/  At pt's request will continue with depo-Provera.  Dose to be given today.  Pt also requesting the H1N1 vaccine which has been ordered for today.  If still has had a poor response to depo-Provera on F/U visit in two months will consider sonohysterogram.  If no endometrial findings on sonohysterogram could try Mirena, HTA, or [place].  Would recommend hysterectomy only as a last resort because at age 82, with the [facility] in 2008 in the postmenopausal range, the pt will most likely going through menopause soon.    If  good response to depo-Provera at next visit will get pelvic U/S and consider repeat EMB or D&C/hysteroscopy if endometrial stripe is still greater than 5 to 7 mm.

## 2008-07-10 NOTE — Progress Notes (Addendum)
Addended byDavid Stall on: 07/10/2008 5:24:06 PM     Modules accepted: Level of Service

## 2008-07-10 NOTE — Progress Notes (Signed)
Influenza Vaccine Procedure  July 10, 2008  For H1N1  1. Has the patient received the information for the influenza vaccine? Yes    2. Does the patient have any of the following contraindications?  Allergy to eggs? No  Allergic reaction to previous influenza vaccines? No  Any other problems to previous influenza vaccines? No  Paralyzed by Guillain-Barre syndrome?  No  Current moderate or severe illness? No  Allergy to contact lens solution? No    3. The vaccine has been administered in the usual fashion.     Immunization information reviewed. Current VIS reviewed and given to patient/ guardian. Verbal assent obtained from patient/ guardian.  See immunization/Injection module or chart review for date of publication and additional information. Verbal assent obtained from patient/guardian. Comfort measures for possible side effects reviewed.     Depo-provera 150 mg given in left deltoid muscle as ordered by Dr. Raylene Miyamoto

## 2008-08-07 ENCOUNTER — Ambulatory Visit (HOSPITAL_BASED_OUTPATIENT_CLINIC_OR_DEPARTMENT_OTHER): Payer: PRIVATE HEALTH INSURANCE | Admitting: Nurse Practitioner

## 2008-08-07 ENCOUNTER — Ambulatory Visit (HOSPITAL_BASED_OUTPATIENT_CLINIC_OR_DEPARTMENT_OTHER): Payer: Self-pay | Admitting: Nurse Practitioner

## 2008-08-07 ENCOUNTER — Encounter (HOSPITAL_BASED_OUTPATIENT_CLINIC_OR_DEPARTMENT_OTHER): Payer: Self-pay | Admitting: Nurse Practitioner

## 2008-08-07 VITALS — BP 142/92 | Temp 97.4°F

## 2008-08-07 DIAGNOSIS — N6009 Solitary cyst of unspecified breast: Secondary | ICD-10-CM

## 2008-08-07 LAB — MA SCREENING MAMMO BILATERAL WITH CAD

## 2008-08-07 NOTE — Progress Notes (Signed)
This office note has been dictated. Account number [id number]

## 2008-08-14 ENCOUNTER — Ambulatory Visit (HOSPITAL_BASED_OUTPATIENT_CLINIC_OR_DEPARTMENT_OTHER): Payer: PRIVATE HEALTH INSURANCE | Admitting: Internal Medicine

## 2008-08-14 LAB — TCH BREAST CTR CLINIC NOTE

## 2008-09-04 ENCOUNTER — Telehealth (HOSPITAL_BASED_OUTPATIENT_CLINIC_OR_DEPARTMENT_OTHER): Payer: Self-pay | Admitting: Obstetrics & Gynecology

## 2008-09-04 ENCOUNTER — Ambulatory Visit (HOSPITAL_BASED_OUTPATIENT_CLINIC_OR_DEPARTMENT_OTHER): Payer: PRIVATE HEALTH INSURANCE | Admitting: Nurse Practitioner

## 2008-09-04 ENCOUNTER — Ambulatory Visit (HOSPITAL_BASED_OUTPATIENT_CLINIC_OR_DEPARTMENT_OTHER): Payer: PRIVATE HEALTH INSURANCE | Admitting: Obstetrics & Gynecology

## 2008-09-04 ENCOUNTER — Encounter (HOSPITAL_BASED_OUTPATIENT_CLINIC_OR_DEPARTMENT_OTHER): Payer: Self-pay | Admitting: Nurse Practitioner

## 2008-09-04 VITALS — BP 128/84 | Temp 98.5°F

## 2008-09-04 VITALS — BP 100/60 | Wt 139.0 lb

## 2008-09-04 DIAGNOSIS — N6009 Solitary cyst of unspecified breast: Principal | ICD-10-CM

## 2008-09-04 DIAGNOSIS — N924 Excessive bleeding in the premenopausal period: Secondary | ICD-10-CM

## 2008-09-04 DIAGNOSIS — Z3009 Encounter for other general counseling and advice on contraception: Secondary | ICD-10-CM

## 2008-09-04 LAB — URINE PREGNANCY TEST (POINT OF CARE): HCG QUALITATIVE URINE: NEGATIVE

## 2008-09-04 MED ORDER — DEPO-PROVERA 150 MG/ML IM SUSP
INTRAMUSCULAR | Status: DC
Start: 2008-09-04 — End: 2008-10-16

## 2008-09-04 NOTE — Telephone Encounter (Signed)
Please have the slides from the cone biopsy sent to Dr. Kendell Bane stat.  Dr. Priscille Loveless works at the [facility] in [place], [place].  Thanks.

## 2008-09-04 NOTE — Progress Notes (Signed)
.  .  .    S/  Bled for 35 days since the last depo-Provera. Previously the depo-Provera worked well for the bleeding she has.  Had a cyst removed from her breast today.  She has had inumerable cysts removed from her breasts in the past.  She had two procedures in [place] for bleeding where they removed the thickest part of the endometrium.  She no longer is taking the primrose oil for hot flashes.    O/  [facility] 12/08 30.86    EMB done 11/09:  Proliferative endometrium    Pap and HPV done 12/09 neg    3/10 benign mammo    A/  Symptomatic multiple small uterine fibroids and perimenopausal bleeding previously well controlled on depo-Provera until recently.    P/  Discussed various treatment options for her symptoms.  Pt does not want Mirena.  She does not want HTA because she does not want to take a day off from work to do this.  She would like to continue on the depo-Provera given at shorter intervals.  She does not want to try OCPs because they caused nausea in the past.  Explained the risk of osteopenia from depo-Provera.  Explained that depo-Provera is not known to cause breast cancer but it can be associated with breast cysts.  After a full discussion of the R/B/A pt has decided to get an injection of depo-Provera today and return in one month for her next injection.

## 2008-09-04 NOTE — Progress Notes (Signed)
.  .  .        This office note has been dictated. Account number [id number]

## 2008-09-08 LAB — TCH BREAST CTR CLINIC NOTE

## 2008-10-02 ENCOUNTER — Ambulatory Visit (HOSPITAL_BASED_OUTPATIENT_CLINIC_OR_DEPARTMENT_OTHER): Payer: PRIVATE HEALTH INSURANCE

## 2008-10-02 DIAGNOSIS — IMO0001 Reserved for inherently not codable concepts without codable children: Secondary | ICD-10-CM

## 2008-10-02 DIAGNOSIS — N924 Excessive bleeding in the premenopausal period: Principal | ICD-10-CM

## 2008-10-02 LAB — URINE PREGNANCY TEST (POINT OF CARE): HCG QUALITATIVE URINE: NEGATIVE

## 2008-10-02 NOTE — Progress Notes (Signed)
.  .  .        Pt is here for DMPA 150mg  IM.  Last DMPA was 09/04/08.  Pt states she was advised to come back in one month for DMPA.  Reviewed order with K.Lefkowitz - order written DMPA to be given q 10-13 weeks.      Pt states she is still concerned with her vaginal bleeding.  States her vaginal bleeding has decreased with DMPA from >8 pads per day to 2-3 pads per day.  States she would like to make appt with Dr.Chaudhuri to dicuss her concern with vaginal bleeding.  States she can only come in on Fridays.    Pt given appt with Dr.Chaudhuri on 10/16/08 - pt agrees to wait until that time for her next DMPA.

## 2008-10-09 ENCOUNTER — Encounter (HOSPITAL_BASED_OUTPATIENT_CLINIC_OR_DEPARTMENT_OTHER): Payer: Self-pay | Admitting: Nurse Practitioner

## 2008-10-09 ENCOUNTER — Ambulatory Visit (HOSPITAL_BASED_OUTPATIENT_CLINIC_OR_DEPARTMENT_OTHER): Payer: PRIVATE HEALTH INSURANCE | Admitting: Nurse Practitioner

## 2008-10-09 VITALS — BP 120/80 | Temp 98.0°F

## 2008-10-09 DIAGNOSIS — N6009 Solitary cyst of unspecified breast: Principal | ICD-10-CM

## 2008-10-09 LAB — US BREAST-AXILLA RIGHT

## 2008-10-09 LAB — US CYST ASPIRATION BREAST RIGHT

## 2008-10-09 LAB — CYST ASPIRATION RT BRST EA ADD

## 2008-10-16 ENCOUNTER — Ambulatory Visit (HOSPITAL_BASED_OUTPATIENT_CLINIC_OR_DEPARTMENT_OTHER): Payer: PRIVATE HEALTH INSURANCE | Admitting: Obstetrics & Gynecology

## 2008-10-16 VITALS — BP 110/80 | Wt 139.0 lb

## 2008-10-16 DIAGNOSIS — N920 Excessive and frequent menstruation with regular cycle: Secondary | ICD-10-CM

## 2008-10-16 LAB — URINE PREGNANCY TEST (POINT OF CARE): HCG QUALITATIVE URINE: NEGATIVE

## 2008-10-16 MED ORDER — DEPO-PROVERA 150 MG/ML IM SUSP
INTRAMUSCULAR | Status: DC
Start: 2008-10-16 — End: 2008-11-13

## 2008-10-16 NOTE — Progress Notes (Signed)
.  .  .        DMPA 150mg  IM given per Dr.Chaudhuri.  Refer to epic

## 2008-10-16 NOTE — Progress Notes (Signed)
.  .  .        S/  Continues to have BTBing on depo-Provera.  Still not interested in other options discussed at the last visit.  She is not having hot flashes but C/O stress and insomnia.     O/  12/09 neg pap and neg HPV    3/10 benign mammo    A/  Symptomatic fibroid uterus.  Insomnia most likely related to stress.    P/  1.  Pt has opted to continue with depo-Provera.  Will get bone density.  Pt to F/U in one month.  Consider depo-Provera at that time.  Can begin to space interval of depo-Provera as symtoms improve on monthly injections.

## 2008-11-12 ENCOUNTER — Ambulatory Visit (HOSPITAL_BASED_OUTPATIENT_CLINIC_OR_DEPARTMENT_OTHER): Payer: Self-pay | Admitting: Rheumatology

## 2008-11-13 ENCOUNTER — Ambulatory Visit (HOSPITAL_BASED_OUTPATIENT_CLINIC_OR_DEPARTMENT_OTHER): Payer: PRIVATE HEALTH INSURANCE | Admitting: Obstetrics & Gynecology

## 2008-11-13 VITALS — BP 142/86 | Wt 148.0 lb

## 2008-11-13 DIAGNOSIS — N920 Excessive and frequent menstruation with regular cycle: Secondary | ICD-10-CM

## 2008-11-13 MED ORDER — DEPO-PROVERA 150 MG/ML IM SUSP
INTRAMUSCULAR | Status: DC
Start: 2008-11-13 — End: 2009-01-22

## 2008-11-13 NOTE — Progress Notes (Signed)
.  .  .        S/  Still bleeding but it is much better.    O/  Bone density form today pending.    12/09 pap and HPV neg    3/10 mammo neg    A/  Pt satisfied with effect of depo-Provera to control bleeding from a fibroid uterus.      P/  Depo-provera today and again in a month.  If bleeding stops by the next visit consider spacing depo-Provera out to Q 2 months.

## 2008-11-17 ENCOUNTER — Encounter (HOSPITAL_BASED_OUTPATIENT_CLINIC_OR_DEPARTMENT_OTHER): Payer: Self-pay | Admitting: Internal Medicine

## 2008-11-17 LAB — LS&HIP BONE DENSITOMETRY SCAN

## 2008-12-14 ENCOUNTER — Encounter (HOSPITAL_BASED_OUTPATIENT_CLINIC_OR_DEPARTMENT_OTHER): Payer: Self-pay | Admitting: Internal Medicine

## 2008-12-14 LAB — TCH BREAST CTR CLINIC NOTE

## 2008-12-16 ENCOUNTER — Ambulatory Visit (HOSPITAL_BASED_OUTPATIENT_CLINIC_OR_DEPARTMENT_OTHER): Payer: PRIVATE HEALTH INSURANCE | Admitting: Obstetrics & Gynecology

## 2008-12-16 VITALS — BP 150/100 | Wt 143.0 lb

## 2008-12-16 DIAGNOSIS — N920 Excessive and frequent menstruation with regular cycle: Secondary | ICD-10-CM

## 2008-12-16 MED ORDER — FIORICET 50-325-40 MG PO TABS
ORAL_TABLET | ORAL | Status: DC
Start: 2008-12-16 — End: 2008-12-19

## 2008-12-16 MED ORDER — FIORICET 50-325-40 MG PO TABS
ORAL_TABLET | ORAL | Status: DC
Start: 2008-12-16 — End: 2008-12-16

## 2008-12-16 NOTE — Progress Notes (Signed)
S/  Continues to bleed not heavily but every day.  Wants to try something else besides depo-Provera.  Declines exam today because she just got out of work.  Has a severe headache that initially responded to ibuprofen but returned.  Has a family H/O HTN and is worried about her BP because it was elevated at the last visit.    O/  BP 150/100.  BP last visit 142/86    Bone density 11/12/08 normal    Last EMB 12/09:  Benign    04/22/09  Pap and HPV neg  3/10 benign mammo      A/  1.   Suspect onset of chronic HTN.  Today's headache could be exacerbating the elevation in BP.  Doubt if the degree of HTN today is causing the headache.  Most likely a tension H/A.    2.  Poor response on depo-Provera for treatment of menomet from uterine fiboids.    P/  1.  Rx for fioricet given.  Pt to F/U with her PCP re management of BP.  Note sent to Dr. Tenny Craw.     2. Discussed R/B/A and limitations of various treatment options including Mirena, HTA, [place], and hysterectomy.  Pt prefers to try Mirena.  Also discussed doing an EMB because did not find the results of the most recent EMB until after the pt left.  Will explain to the pt that an EMB is not necessary at this time.  Pt declined having the Mirena placed today.  F/U 12/25/08 to see the health counselor to review the consent for Mirena and to have the Mirena placed.    2 minute visit of which more than half was spent in face to face discussion.

## 2008-12-18 ENCOUNTER — Encounter (HOSPITAL_BASED_OUTPATIENT_CLINIC_OR_DEPARTMENT_OTHER): Payer: Self-pay | Admitting: Internal Medicine

## 2008-12-18 ENCOUNTER — Ambulatory Visit (HOSPITAL_BASED_OUTPATIENT_CLINIC_OR_DEPARTMENT_OTHER): Payer: PRIVATE HEALTH INSURANCE | Admitting: Ambulatory Care

## 2008-12-18 DIAGNOSIS — R51 Headache: Principal | ICD-10-CM

## 2008-12-18 DIAGNOSIS — IMO0001 Reserved for inherently not codable concepts without codable children: Secondary | ICD-10-CM

## 2008-12-18 DIAGNOSIS — I10 Essential (primary) hypertension: Secondary | ICD-10-CM | POA: Insufficient documentation

## 2008-12-18 NOTE — Progress Notes (Signed)
Patient walked in with c/o and worry re blood pressure and headache.  Seen at [facility] and blood pressure there was 150/100. Apical pulse 76 regular. Has had a headache for 4 days. Nausea and dizziness off and on- headache described as a 8-9 yesterday today is better 5/10. Photophobia denied. Has had headaches in the past- uses ibuprophen. Drinks one cup coffee in AM.  No chest pain no SOB, palpitations denied but had last night. Pt has hx anemia. Good energy level. Vision normal. Still menstruates, in fact she states is a problem, heavy periods followed by Dr.Chaudhuri.     Only can come in on Fridays- baby sits. States only left Dr.Balaban because he is not here on Fridays.Given appt with Dr.Hertzman-Miller tomorrow. Advised to ER if worsening headache, dizziness, SOB or palpitation prior to that. In full agreement with plan.    20 min face-to-face

## 2008-12-19 ENCOUNTER — Encounter (HOSPITAL_BASED_OUTPATIENT_CLINIC_OR_DEPARTMENT_OTHER): Payer: Self-pay | Admitting: Internal Medicine

## 2008-12-19 ENCOUNTER — Ambulatory Visit (HOSPITAL_BASED_OUTPATIENT_CLINIC_OR_DEPARTMENT_OTHER): Payer: PRIVATE HEALTH INSURANCE | Admitting: Internal Medicine

## 2008-12-19 VITALS — BP 150/90 | HR 88 | Temp 98.5°F | Wt 138.0 lb

## 2008-12-19 DIAGNOSIS — I10 Essential (primary) hypertension: Secondary | ICD-10-CM

## 2008-12-19 MED ORDER — AMLODIPINE BESYLATE 5 MG PO TABS
ORAL_TABLET | ORAL | Status: DC
Start: 2008-12-19 — End: 2009-01-22

## 2008-12-19 NOTE — Progress Notes (Signed)
H/o presenting illness ,  Patient is 53 yr old female ,new to me presented for headache . Present encounter done via phone interpreter .  Patient has h/o chronic headache (since childhood ), in occipital and in neck area. But recently for last 5 days ,getting continuous headaches in frontal area  . Different from her usual headaches   no double /blurred vision .silght nausea but no vomiting . Grades 3-4/10 .  She went to GYN ,on 8/4 and her  BP was  150/100.  Denies chest pain/sob    Patient Active Problem List:     BREAST CYSTS [610.0]     MENORRHAGIA / ANEMIA [627.0]     CONDYLOMA [078.11]     HYPERCHOLESTEROLEMIA [272.0]     Hypertension [401.9AH]    Review of Patient's Allergies indicates:   Nkda (no known drug*      Current medication   1. DEPO-PROVERA 150 MG/ML IM SUSP  :INTIATE TODAY; REPEAT EVERY 10 TO 13 WEEKS FOR CONTRACEPTION  2. IRON 325 (65 FE) MG OR TABS  2 TABLET DAILY  3. IBUPROFEN 200 MG OR TABS  :2 TABLETS EVERY 8 HOURS AS NEEDED      ROS  Constitutional : no  fever     EYES :   no  blurred vision ,      ENT  :     + headache,  no  earache,      CVS :   no   chest pain,      RESP:  no   sob,      GI      :    no   nausea,  no   vomiting ,  no abd pain,          NEURILOGY:   no  numbness,  no    weakness  :     BP 160/94 (right arm) ,150/90(left arm)  Pulse 88  Temp(Src) 98.5 F (36.9 C) (Oral)  Wt 138 lb (62.596 kg)  SpO2 98%    EXAM  Cons   : VS elevated bp                Gen appear aox3,nad   Eyes    : Conjunctiva/Lids no pallor/anicteric                 Pupils perrla   ENT     :    Oropharynx normal           Neck     :supple  Resp     :b/l clear   Cardiovascular :s1s2 normal ,RRR  No pedal edema ,  Dorsalis pedis palpable   GI              : soft,no tenderness present     Assessment/plan  401.9AH Hypertension  (primary encounter diagnosis)  Comment: low salt diet .  Monitor bp at home   Plan: AMLODIPINE BESYLATE 5 MG OR TABS

## 2008-12-24 ENCOUNTER — Ambulatory Visit (HOSPITAL_BASED_OUTPATIENT_CLINIC_OR_DEPARTMENT_OTHER): Payer: PRIVATE HEALTH INSURANCE | Admitting: Obstetrics & Gynecology

## 2008-12-24 ENCOUNTER — Ambulatory Visit (HOSPITAL_BASED_OUTPATIENT_CLINIC_OR_DEPARTMENT_OTHER): Payer: PRIVATE HEALTH INSURANCE

## 2008-12-25 ENCOUNTER — Ambulatory Visit (HOSPITAL_BASED_OUTPATIENT_CLINIC_OR_DEPARTMENT_OTHER): Payer: PRIVATE HEALTH INSURANCE | Admitting: Obstetrics & Gynecology

## 2008-12-25 ENCOUNTER — Encounter (HOSPITAL_BASED_OUTPATIENT_CLINIC_OR_DEPARTMENT_OTHER): Payer: Self-pay

## 2008-12-25 ENCOUNTER — Ambulatory Visit (HOSPITAL_BASED_OUTPATIENT_CLINIC_OR_DEPARTMENT_OTHER): Payer: PRIVATE HEALTH INSURANCE

## 2008-12-25 VITALS — BP 120/82 | Wt 130.0 lb

## 2008-12-25 DIAGNOSIS — Z3009 Encounter for other general counseling and advice on contraception: Secondary | ICD-10-CM

## 2008-12-25 DIAGNOSIS — N920 Excessive and frequent menstruation with regular cycle: Secondary | ICD-10-CM

## 2008-12-25 DIAGNOSIS — D251 Intramural leiomyoma of uterus: Secondary | ICD-10-CM

## 2008-12-25 LAB — URINE PREGNANCY TEST (POINT OF CARE): HCG QUALITATIVE URINE: NEGATIVE

## 2008-12-25 NOTE — Progress Notes (Signed)
After a time out was done the following was done:    1.  genprobe obtained    2.  EMB done    3.  Mirena placed  (uterus sounded to 8 cm)    A/  Symptomatic fibroid uterus.  Failed depo-Provera.  Borderline elevated BPs.    P/  F/U in three weeks to review EMB result and check the IUD string.  If poor results with Mirena consider HTA,  [place], or laparoscopic supracervical hysterectomy.  Will encourage pt to give the Mirena a 6 month trial before giving up on it.

## 2008-12-25 NOTE — Progress Notes (Signed)
.    Subjective: FPC    History: 53 yo female [place]     No chief complaint on file.        Current outpatient prescriptions:  AMLODIPINE BESYLATE 5 MG OR TABS 1 tablet a day Disp: 30 tablet Rfl: 1   DEPO-PROVERA 150 MG/ML IM SUSP INTIATE TODAY; REPEAT EVERY 10 TO 13 WEEKS FOR CONTRACEPTION Disp: 1 Rfl: 3   IRON 325 (65 FE) MG OR TABS 2 TABLET DAILY Disp: 30 Rfl: 0   IBUPROFEN 200 MG OR TABS 2 TABLETS EVERY 8 HOURS AS NEEDED Disp: 100 Rfl: 0         Allergies: Nkda    Substance Use:    Alcohol Use: No         Drug Use: No         Tobacco Use: Never       LMP: ? Spotting     Menarche: 53 yo    Regular Cycles?: no   (how often:  monthly  # days: ? 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 She used Depo provera and didn't work very well.    Date of last SA:  3 weeks ago, not sure    Protection used: no    UCG required: no    Condom Use: never    Condom instruction done:  yes    HX of STDs:  none    -Disc:  Transmission (Blood, Semen, Vaginal Fluid and Breastmilk), Risky Behaviors, Non-Risks/Myths,  Seroconversion Period , Result Waiting Time, Possible Results, Feelings Surrounding Possible Results, Plans for Informing of Results, Support Structures and Hep C Risk.     Risk Assessment Completed:  yes  Piercing/Tattoo: no  Known Partner Risks: none  IV Drug Use:  no    Risk Reduction Planning Completed     GYN:  Last GYN exam (date/result):       Hx of Abnormal PAP:  none    Family  Scientist, research (medical) and Referrals:  Anatomy/Physiology  Contraceptive overview: Risks, Benefits, Warning Signs, Effectiveness, Side Effects, Instructions, Follow Up  Informed Consent signed for:  Family Planning and Mirena IUD  STD/HIV Risk Assessment Done  STD/HIV prevention/safer sex discussed  Condoms offered and  Distributed  Emergency Contraception Discussed  Confidentiality  Clinic contact number given, for both daytime and after hours/emergencies  Handouts given.    Pt wants to use a method to decrease her bleeding issue.     All methods of BC discussed: OCP (progestin only and combined), Depo Provera, Mirena and Paragard IUD, Implanon, Ortho-Evra and VaginalRing. I also expalined all methods side effects, risks, benefits and effectiveness.  Pt decided to try Mirena IUD.

## 2008-12-25 NOTE — Progress Notes (Signed)
PATIENT/PROCEDURE VERIFICATION DOCUMENTATION    Correct patient: Yes  Correct procedure: Yes  Correct side, site, mark visible if applicable: Yes  Correct position: Yes  Special equipment/implant(s) present, if applicable: Yes    Time-out completed, documented by provider doing procedure or designated team member:  Sigmund Hazel    12/25/2008    10:35 AMM      Due to the language barrier, the office visit was conducted in [place] with an interpreter. The interpreter's name is DIRCE. The interpreter was present during the entire visit.

## 2008-12-29 LAB — CHLAMYDIA GC NAAT
GENPROBE CHLAMYDIA: NEGATIVE
GENPROBE GC: NEGATIVE

## 2008-12-29 LAB — SURGICAL PATH SPECIMEN

## 2009-01-14 ENCOUNTER — Ambulatory Visit (HOSPITAL_BASED_OUTPATIENT_CLINIC_OR_DEPARTMENT_OTHER): Payer: PRIVATE HEALTH INSURANCE | Admitting: Obstetrics & Gynecology

## 2009-01-22 ENCOUNTER — Encounter (HOSPITAL_BASED_OUTPATIENT_CLINIC_OR_DEPARTMENT_OTHER): Payer: Self-pay | Admitting: Internal Medicine

## 2009-01-22 ENCOUNTER — Ambulatory Visit (HOSPITAL_BASED_OUTPATIENT_CLINIC_OR_DEPARTMENT_OTHER): Payer: PRIVATE HEALTH INSURANCE | Admitting: Obstetrics & Gynecology

## 2009-01-22 ENCOUNTER — Ambulatory Visit (HOSPITAL_BASED_OUTPATIENT_CLINIC_OR_DEPARTMENT_OTHER): Payer: PRIVATE HEALTH INSURANCE | Admitting: Internal Medicine

## 2009-01-22 VITALS — BP 128/78 | HR 89 | Temp 98.4°F | Wt 137.0 lb

## 2009-01-22 VITALS — BP 150/100 | Wt 137.0 lb

## 2009-01-22 DIAGNOSIS — E785 Hyperlipidemia, unspecified: Secondary | ICD-10-CM

## 2009-01-22 DIAGNOSIS — N924 Excessive bleeding in the premenopausal period: Secondary | ICD-10-CM

## 2009-01-22 DIAGNOSIS — G4700 Insomnia, unspecified: Secondary | ICD-10-CM

## 2009-01-22 DIAGNOSIS — H538 Other visual disturbances: Secondary | ICD-10-CM

## 2009-01-22 DIAGNOSIS — D251 Intramural leiomyoma of uterus: Secondary | ICD-10-CM

## 2009-01-22 DIAGNOSIS — Z114 Encounter for screening for human immunodeficiency virus [HIV]: Secondary | ICD-10-CM

## 2009-01-22 DIAGNOSIS — I10 Essential (primary) hypertension: Principal | ICD-10-CM

## 2009-01-22 LAB — CBC, PLATELET & DIFFERENTIAL
BASOPHIL %: 0.5 % (ref 0.0–2.0)
EOSINOPHIL %: 2.8 % (ref 0.0–7.0)
HEMATOCRIT: 38.4 % (ref 36.0–48.0)
HEMOGLOBIN: 13.5 g/dl (ref 12.0–16.0)
LYMPHOCYTE %: 33.9 % (ref 13.0–39.0)
MEAN CORP HGB CONC: 35.2 g/dl (ref 32.0–36.0)
MEAN CORPUSCULAR HGB: 33.8 pg — ABNORMAL HIGH (ref 27.0–33.0)
MEAN CORPUSCULAR VOL: 96.2 fl (ref 80.0–100.0)
MEAN PLATELET VOLUME: 11.4 fl — ABNORMAL HIGH (ref 6.4–10.8)
MONOCYTE %: 6.8 % (ref 1.0–12.0)
NEUTROPHIL %: 56 % (ref 46.0–79.0)
PLATELET COUNT: 174 10*3/uL (ref 150–400)
RBC DISTRIBUTION WIDTH: 13.2 % (ref 11.5–14.3)
RED BLOOD CELL COUNT: 3.99 M/uL — ABNORMAL LOW (ref 4.50–5.10)
WHITE BLOOD CELL COUNT: 5.8 10*3/uL (ref 4.0–10.8)

## 2009-01-22 MED ORDER — TRAZODONE HCL 50 MG PO TABS
ORAL_TABLET | ORAL | Status: DC
Start: 2009-01-22 — End: 2009-04-02

## 2009-01-22 MED ORDER — HYDROCHLOROTHIAZIDE 25 MG PO TABS
ORAL_TABLET | ORAL | Status: DC
Start: 2009-01-22 — End: 2009-03-19

## 2009-01-22 NOTE — Progress Notes (Signed)
S/  Bleeding has slowed    O/  IUD string visualized    12/25/08 EMB benign, genprobe neg    12/09 pap and HPV neg    A/  Symptomatic fibroid uterus.  Failed depo-Provera    P/  Observe with Mirena.  Pt to call to be seen if unsatisfied with results using Mirena.

## 2009-01-22 NOTE — Progress Notes (Signed)
.    Due to the language barrier, the office visit was conducted in [place] with an interpreter. The int. The interpreter was present during the entire visit.

## 2009-01-22 NOTE — Progress Notes (Signed)
[facility]    CC: Establish Primary Care, f/u HTN, insomnia, blurry vision, high cholesterol    HPI: Tina [name] is a 53 year old year old female who presents to clinic to estab[facility] primary care and f/u HTN , insomnia, blurry vision, high cholesterol.  [place] done with telephone interpreter.    Was told by her OB/GYN that her blood pressure was elevated. Both parents with elevated BP.  She was started on Norvasc 5mg  at her last visit. Expensive medication.  Tolerating it OK. No CP or SOB.    Continues with heavy menses which she has been following with OB/GYN. Not on any iron supplements at this time, has had anemia in the past.    Has had insomnia for a long time. Cannot sleep at night. Wakes up multiple times. Has trouble falling asleep and then has to be up early for work.  Has never taken anything for sleep before.    All past medical history, current problems, family/social history, medications and allergies reviewed. All updated in EPIC.        Past Medical History    H/o Condyloma     Comment: 2003 (vulvar)    S/P Appendectomy     S/P Laparoscopic Procedure     Comment: Exploratory         Patient Active Problem List:     Multiple Breast Cysts [610.0]     Menorrhagia [627.0]     Hypertension [401.9AH]     Hyperlipidemia LDL Goal < 160 [272.4DA]      Allergies: See EPIC    Medications: see EPIC    Family/Social History: Documented in EPIC    Review of Systems   Respiratory: Negative for shortness of breath.    Cardiovascular: Negative for chest pain.   Genitourinary:        Heavy menses.     Physical Exam   Constitutional: She is oriented to person, place, and time. She appears well-developed and well-nourished. No distress.   Cardiovascular: Normal rate and regular rhythm.    No murmur heard.  Pulmonary/Chest: Effort normal and breath sounds normal. She has no wheezes.   Musculoskeletal: She exhibits no edema.   Neurological: She is alert and oriented to person, place, and time.    Psychiatric: She has a normal mood and affect.   BP 128/78  Pulse 89  Temp(Src) 98.4 F (36.9 C) (Oral)  Wt 137 lb (62.143 kg)  SpO2 98%    ASSESMENT/PLAN:  401.9AH Hypertension  (primary encounter diagnosis)  Comment: Started on amlodipine. Expensive.  Plan: Switch to HCTZ 25mg  daily    780.52A Insomnia  Comment: Difficulty falling asleep, making day difficult.  Plan: Trazodone 25-50 mg at night as needed, trial on weekend to see how she reacts    368.8AU Blurry Vision  Comment: Needs visual exam  Plan: REFERRAL TO OPHTHALMOLOGY (INT)    627.0 Menorrhagia  Comment: Longstanding heavy periods. Previously on iron supplements, not currently.  Plan: COMPLETE CBC, if hgb low will start iron replacement    272.4DA Hyperlipidemia LDL Goal < 160  Comment: Has had high cholesterol in the past. No risk factors.  Plan: CHOLESTEROL, HIGH DENSITY LIPOPROTEIN    V73.89E Screening for HIV (Human Immunodeficiency Virus)  Comment: Never had an HIV test previously  Plan: HIV 1 AND 2 PLUS O ANTIBODY    Return to Clinic:  IWhen available for full physical exam and f/u HTN and insomnia, or sooner if problems arise.  I have spent 25 minutes in face to face time with this patient/patient proxy of which > 50% was in counseling or coordination of care regarding above issues/Dx.  Nicole Cella. Tenny Craw, MD

## 2009-01-25 ENCOUNTER — Encounter (HOSPITAL_BASED_OUTPATIENT_CLINIC_OR_DEPARTMENT_OTHER): Payer: Self-pay | Admitting: Internal Medicine

## 2009-01-25 DIAGNOSIS — E785 Hyperlipidemia, unspecified: Secondary | ICD-10-CM

## 2009-01-25 LAB — CHG LIPOPROTEIN DIR MEAS HIGH DENSITY CHOLESTEROL: HIGH DENSITY LIPOPROTEIN: 30 mg/dl — ABNORMAL LOW (ref 35–85)

## 2009-01-25 LAB — CHOLESTEROL: Cholesterol: 199 mg/dl (ref 0–200)

## 2009-01-25 LAB — HIV 1 AND 2 PLUS O ANTIBODY: HIV 1 AND 2 PLUS O SCREEN: NONREACTIVE

## 2009-02-19 ENCOUNTER — Encounter (HOSPITAL_BASED_OUTPATIENT_CLINIC_OR_DEPARTMENT_OTHER): Payer: Self-pay | Admitting: Ophthalmology

## 2009-02-19 ENCOUNTER — Ambulatory Visit (HOSPITAL_BASED_OUTPATIENT_CLINIC_OR_DEPARTMENT_OTHER): Payer: PRIVATE HEALTH INSURANCE | Admitting: Ophthalmology

## 2009-02-19 DIAGNOSIS — H5200 Hypermetropia, unspecified eye: Secondary | ICD-10-CM

## 2009-02-19 DIAGNOSIS — R51 Headache: Secondary | ICD-10-CM

## 2009-02-19 DIAGNOSIS — H524 Presbyopia: Secondary | ICD-10-CM

## 2009-02-19 NOTE — Nursing Note (Signed)
>>   Tina [name]     Fri Feb 19, 2009  3:20 PM  Blurry Va mostly at near. Pain around the eye after prolonged reading.

## 2009-02-19 NOTE — Progress Notes (Signed)
She is complaining of frequent headaches.  There is no ophthalmic cause or clue to the reasons for headaches after a complete ophthalmic examination, including dilated examination of the optic nerve or retina.  However, she may be experiencing some asthenopsia, which would be relieved with glasses.  Specifically, there is no optic nerve head swelling (papilledema).  She will follow up with her PCP if the headaches persist, become more frequent or more severe, or she develops any other neurological symptoms or signs.    Hyperopia with astigmatism with presbyopia.  She's given a prescription for glasses.

## 2009-03-19 ENCOUNTER — Ambulatory Visit (HOSPITAL_BASED_OUTPATIENT_CLINIC_OR_DEPARTMENT_OTHER): Payer: PRIVATE HEALTH INSURANCE | Admitting: Internal Medicine

## 2009-03-19 ENCOUNTER — Encounter (HOSPITAL_BASED_OUTPATIENT_CLINIC_OR_DEPARTMENT_OTHER): Payer: Self-pay | Admitting: Internal Medicine

## 2009-03-19 VITALS — BP 146/80 | HR 116 | Wt 137.0 lb

## 2009-03-19 DIAGNOSIS — N951 Menopausal and female climacteric states: Secondary | ICD-10-CM

## 2009-03-19 DIAGNOSIS — I10 Essential (primary) hypertension: Secondary | ICD-10-CM

## 2009-03-19 DIAGNOSIS — R000 Tachycardia, unspecified: Secondary | ICD-10-CM

## 2009-03-19 DIAGNOSIS — Z23 Encounter for immunization: Secondary | ICD-10-CM

## 2009-03-19 LAB — EKG

## 2009-03-19 MED ORDER — AMLODIPINE BESYLATE 5 MG PO TABS
ORAL_TABLET | ORAL | Status: DC
Start: 2009-03-19 — End: 2009-07-20

## 2009-03-19 NOTE — Progress Notes (Signed)
[facility]    CC: HTN    HPI: Tina [name] is a 53 year old year old female who presents to clinic to f/u HTN.     Patient states she has been taking her blood pressure at the machines at the drug store and her SBP has been 150.  Pulse has also been elevated. Does feel occasional palpitations. Having frequent hot flashes.  Sometimes with headache. Drinking less water than she was in the summer.      Past Medical History    H/o Condyloma     Comment: 2003 (vulvar)    S/p appendectomy     S/p laparoscopic procedure     Comment: Exploratory         Patient Active Problem List:     Multiple Breast Cysts [610.0]     Menorrhagia [627.0]     Hypertension [401.9AH]     Hyperlipidemia LDL Goal < 130 [272.4CV]     Hyperopia [367.0B]     Presbyopia [367.4]      Allergies: See EPIC    Medications: see EPIC    Family/Social History: Documented in EPIC    Review of Systems   Constitutional:        Hot flashes.   Cardiovascular: Positive for palpitations. Negative for chest pain.   Neurological: Positive for headaches.     Physical Exam   Constitutional: She is oriented to person, place, and time. She appears well-developed and well-nourished. No distress.   Cardiovascular: Normal rate and regular rhythm.    Pulmonary/Chest: Effort normal and breath sounds normal.   Musculoskeletal: She exhibits no edema.   Neurological: She is alert and oriented to person, place, and time.   Psychiatric: She has a normal mood and affect.   BP 146/80  Pulse 116  Wt 137 lb (62.143 kg)  SpO2 97%    EKG: EKG personally reviewed and shows sinus rhythm at 65 bpm, normal intervals (PR<235ms, QRS <120 ms & QTc <440 ms), normal axis, no atrial or ventricular hypertrophy, no Q waves, no ST-T wave abnormalities.      ASSESMENT/PLAN:  785.0H Tachycardia  (primary encounter diagnosis)  Comment: Tachycardic on initial vitals. At time of exam normal rate and rhythm. Patient states her pulse has been elevated when she has taken her BP at a  machine at the drug store.    Plan: EKG reviewed, THYROID STIM HORMONE    401.9AH Hypertension  Comment: SBP not as well controlled since change to HCTZ.  SBP 140-150's.   Plan: BMP with HCTZ, change to amlodipine 5mg  daily    627.2AV Hot flash, menopausal  Comment: Patient with hot flashes. Had Mirena IUD placed by gynecology.  Plan: Observe.    V04.81 Need for prophylactic vaccination and inoculation against influenza  Comment: Annual flu shot.  Plan: FLU VACC SPLIT VIRUS    Return to Clinic:  When available for annual physical, or sooner if problems arise.   Nicole Cella. Tenny Craw, MD

## 2009-03-19 NOTE — Progress Notes (Signed)
Patient ordered for flu vaccine declined interpreter verified name dob and allergies, denies any past difficulties with previous vaccine VIS given adm in left deltoid at pts req

## 2009-03-20 LAB — BASIC METABOLIC PANEL
ANION GAP: 10 mmol/L (ref 2–25)
BUN (UREA NITROGEN): 14 mg/dl (ref 6–20)
CALCIUM: 9.7 mg/dl (ref 8.6–10.3)
CARBON DIOXIDE: 28 mmol/L (ref 22–32)
CHLORIDE: 104 mmol/L (ref 101–111)
CREATININE: 0.6 mg/dl (ref 0.4–1.2)
ESTIMATED GLOMERULAR FILT RATE: >60 mL/min (ref >60)
Glucose Random: 104 mg/dl (ref 74–160)
POTASSIUM: 3.5 mmol/L (ref 3.5–5.1)
SODIUM: 142 mmol/L (ref 135–144)

## 2009-03-20 LAB — TSH (THYROID STIMULATING HORMONE): TSH (THYROID STIM HORMONE): 1.05 u[IU]/mL (ref 0.34–5.60)

## 2009-04-02 ENCOUNTER — Ambulatory Visit (HOSPITAL_BASED_OUTPATIENT_CLINIC_OR_DEPARTMENT_OTHER): Payer: PRIVATE HEALTH INSURANCE | Admitting: Family

## 2009-04-02 ENCOUNTER — Encounter (HOSPITAL_BASED_OUTPATIENT_CLINIC_OR_DEPARTMENT_OTHER): Payer: Self-pay | Admitting: Internal Medicine

## 2009-04-02 VITALS — BP 114/74 | HR 84 | Temp 98.7°F | Wt 142.0 lb

## 2009-04-02 DIAGNOSIS — R232 Flushing: Secondary | ICD-10-CM

## 2009-04-02 DIAGNOSIS — I10 Essential (primary) hypertension: Principal | ICD-10-CM

## 2009-04-02 MED ORDER — FLUOXETINE HCL 20 MG PO CAPS
ORAL_CAPSULE | ORAL | Status: DC
Start: 2009-04-02 — End: 2009-10-04

## 2009-04-02 NOTE — Progress Notes (Signed)
S/O: 53 year old woman here for blood pressure check. Medication review was attempted but pt. Does not know what meds she is taking. She states her blood pressure is normal now - "with this medication". The BT pill caused her to feel groggy in the morning so she could not use it because she had to work. She took the whole pill vs 1/2 pill. I have reinstructed her on using 1/2 tablet at 8:30 AM. She later focuses on hotflashes which wake her at night. She is not interested in RX hormones and states she has a +FH breast cancer. She is interested in an alternative medicine.     Review of Systems   Constitutional: Negative.    HENT: Negative.    Eyes: Negative.    Respiratory: Negative.    Cardiovascular: Negative.         Taking Norvasc daily as directed.   Gastrointestinal: Negative.    Musculoskeletal: Negative.    Skin: Negative.    Neurological: Negative.    Endo/Heme/Allergies: Negative.    Psychiatric/Behavioral: The patient has insomnia.         Finds Trazodone 50 mg is too strong. Even though she states she was not able to sleep. She keeps changing her story. She works early in the morning. She states menopause has caused her sleep disturbance. She reports a lot of hotflashes at night and wakes up sweating. She is taking "menopuase medicine which is soy based."     Physical Exam   Constitutional: She is oriented to person, place, and time. She appears well-developed and well-nourished.   HENT:   Head: Normocephalic and atraumatic.   Eyes: Conjunctivae are normal.   Neck: Normal range of motion. Neck supple.   Pulmonary/Chest: Effort normal and breath sounds normal.   Musculoskeletal: Normal range of motion.   Neurological: She is alert and oriented to person, place, and time. She exhibits normal muscle tone. Coordination normal.   Skin: Skin is warm and dry.   Psychiatric: She has a normal mood and affect. Her behavior is normal. Judgment and thought content normal.         ASSESSMENT and CARE:    401.9AH  Hypertension  (primary encounter diagnosis)  Comment: She has responded quite nicely to calcium channel blocker.  Plan: Continue Norvasc as ordered.     782.62U Hot flash not due to menopause  Comment: After a lot of discussion the pt. States her sleep problem is due to the hotflashes. Prozac can be helpful with this sx for some women. Pt. Is not a candidate for HRT due to +FH breast cancer. She is willing to try the medicine. I have cautioned her about possible SI and she knows to call here as needed or use ED. To take in AM daily.  Plan: PROZAC 20 mg daily

## 2009-04-02 NOTE — Patient Instructions (Signed)
Cautioned about possible adverse effects.

## 2009-04-13 ENCOUNTER — Telehealth (HOSPITAL_BASED_OUTPATIENT_CLINIC_OR_DEPARTMENT_OTHER): Payer: Self-pay | Admitting: Internal Medicine

## 2009-04-13 NOTE — Telephone Encounter (Signed)
T/c to pt to inform about cancellation of appt on 05/21/09. New appt given to 05/28/09 @ 2pm. Spoke with husband [name].

## 2009-04-16 ENCOUNTER — Ambulatory Visit (HOSPITAL_BASED_OUTPATIENT_CLINIC_OR_DEPARTMENT_OTHER): Payer: PRIVATE HEALTH INSURANCE | Admitting: Family

## 2009-05-20 ENCOUNTER — Emergency Department (HOSPITAL_BASED_OUTPATIENT_CLINIC_OR_DEPARTMENT_OTHER)
Admission: RE | Admit: 2009-05-20 | Disposition: A | Discharge status: Home / Self Care | Payer: Self-pay | Source: Emergency Department | Attending: Emergency Medicine | Admitting: Emergency Medicine

## 2009-05-20 ENCOUNTER — Encounter (HOSPITAL_BASED_OUTPATIENT_CLINIC_OR_DEPARTMENT_OTHER): Payer: Self-pay

## 2009-05-20 LAB — EMERGENCY ROOM NOTE

## 2009-05-20 MED ORDER — PERCOCET 5-325 MG PO TABS
ORAL_TABLET | ORAL | Status: AC
Start: 2009-05-20 — End: 2009-06-19

## 2009-05-20 MED ORDER — MOTRIN 800 MG PO TABS
ORAL_TABLET | ORAL | Status: AC
Start: 2009-05-20 — End: 2009-06-19

## 2009-05-20 NOTE — Discharge Instructions (Signed)
Rest.  Sling.  Pain med as needed.  Ortho will call you tomorrow to arrange follow up.  Return at any time if worse, or for new or different symptoms.

## 2009-05-20 NOTE — ED Notes (Signed)
Pt c/o 3  Day history of right shoulder pain which radiates down to right hand.  "sometimes my thumb and fingers don't work either".  Denies trauma.  Ibuprofen 800mg  Q 6 hours without relief. Right radial pulse positive with + cap refill.

## 2009-05-21 ENCOUNTER — Encounter (HOSPITAL_BASED_OUTPATIENT_CLINIC_OR_DEPARTMENT_OTHER): Payer: Self-pay | Admitting: Orthopaedic Surgery

## 2009-05-21 ENCOUNTER — Ambulatory Visit (HOSPITAL_BASED_OUTPATIENT_CLINIC_OR_DEPARTMENT_OTHER): Payer: PRIVATE HEALTH INSURANCE | Admitting: Physical Medicine & Rehabilitation

## 2009-05-21 ENCOUNTER — Ambulatory Visit (HOSPITAL_BASED_OUTPATIENT_CLINIC_OR_DEPARTMENT_OTHER): Payer: PRIVATE HEALTH INSURANCE | Admitting: Orthopaedic Surgery

## 2009-05-21 LAB — XR SHOULDER RIGHT MINIMUM 2 VIEWS

## 2009-05-21 NOTE — Progress Notes (Signed)
This office note has been dictated. Account number [id number]

## 2009-05-24 ENCOUNTER — Encounter (HOSPITAL_BASED_OUTPATIENT_CLINIC_OR_DEPARTMENT_OTHER): Payer: Self-pay | Admitting: Internal Medicine

## 2009-05-24 DIAGNOSIS — M779 Enthesopathy, unspecified: Secondary | ICD-10-CM

## 2009-05-24 LAB — ORTHOPEDIC OFFICE NOTE

## 2009-05-28 ENCOUNTER — Encounter (HOSPITAL_BASED_OUTPATIENT_CLINIC_OR_DEPARTMENT_OTHER): Payer: Self-pay | Admitting: Internal Medicine

## 2009-05-28 ENCOUNTER — Ambulatory Visit (HOSPITAL_BASED_OUTPATIENT_CLINIC_OR_DEPARTMENT_OTHER): Payer: PRIVATE HEALTH INSURANCE | Admitting: Internal Medicine

## 2009-05-28 ENCOUNTER — Ambulatory Visit (HOSPITAL_BASED_OUTPATIENT_CLINIC_OR_DEPARTMENT_OTHER): Payer: Self-pay | Admitting: Orthopaedic Surgery

## 2009-05-28 VITALS — BP 122/84 | Temp 98.6°F | Wt 142.0 lb

## 2009-05-28 DIAGNOSIS — J329 Chronic sinusitis, unspecified: Secondary | ICD-10-CM

## 2009-05-28 DIAGNOSIS — Z0000 Encounter for general adult medical examination without abnormal findings: Secondary | ICD-10-CM

## 2009-05-28 DIAGNOSIS — M65949 Unspecified synovitis and tenosynovitis, unspecified hand: Secondary | ICD-10-CM | POA: Insufficient documentation

## 2009-05-28 DIAGNOSIS — M659 Synovitis and tenosynovitis, unspecified: Secondary | ICD-10-CM | POA: Insufficient documentation

## 2009-05-28 LAB — XR SKULL 1 TO 3 VIEWS

## 2009-05-28 MED ORDER — FLONASE 50 MCG/ACT NA SUSP
NASAL | Status: DC
Start: 2009-05-28 — End: 2009-11-19

## 2009-05-28 NOTE — Progress Notes (Signed)
[facility]     CC: Well woman physical     HPI:  Arta Stump is a 54 year old year old female who presents to clinic for a routine preventive physical exam.     Patient with chronic sinusitis. Gets it every year in the winter. Has used flonase in the past, helps quite a bit- gets ear pain, maxillary sinus pain.     Right shoulder pain and right thumb pain- gets stuck and hurts at MCP joint.         Past Medical History    H/o Condyloma     Comment: 2003 (vulvar)    S/p appendectomy     S/p laparoscopic procedure     Comment: Exploratory         Patient Active Problem List:     Multiple Breast Cysts [610.0]     Menorrhagia [627.0]     Hypertension [401.9AH]     Hyperlipidemia LDL Goal < 130 [272.4CV]     Hot flashes not due to menopause [782.62V]     Tendonitis [726.90D]     Full dentures [V45.84E]       Review of Patient's Allergies indicates:   Nkda (no known drug*        Medications: See EPIC    Social/Family History: Documented in EPIC    ROS:  ? Fever with right shoulder pain last week. No unexplained weight loss.  No visual changes.   Sinus congestion. Ear pain.   No prolonged cough. No dyspnea or chest pain on exertion.  No abdominal pain or change in bowel habits.  No vaginal discharge or dysuria.  Breast lumps (longstanding).  Right shoulder pain, Right thumb pain. Knee pain.  No new rashes or skin changes.  No paresthesias or unusual headaches.  No sadness or anxiety that interferes with day-to-day activities.      OBJECTIVE:   Vital Signs: BP 122/84  Temp 98.6 F (37 C)  Wt 142 lb (64.411 kg)  Exam:  General:  Patient is a well developed, well nourished female in no apparent distress.   HEENT:  Pupils equal round and reactive to light.  Extraocular muscle range of motion full and without nystagmus.  No scleral icterus. Bilateral tympanic membranes clear.  Oral mucous membranes moist. Dentures.  Neck:  No cervical, supraclavicular or infraclavicular lymphadenopathy.  No  thyromegaly.   CV:  Auscultation reveals a regular rate and rhythm without murmurs, rubs or gallops.   Resp:  Lungs clear to auscultation without wheezes, rales or rhonchi.   Abdomen:  Bowel sounds present.  Abdomen soft, non-tender, nondistended.  No hepatosplenomegaly.   Breasts:  Lumpy. Tenderness to palpation of left breast- longstanding per patient.  No axillary lymphadenopathy.  Musculoskeletal: Pain with abduction of right shoulder- not able to raise for breast exam. No lower extremity edema.  2+ dorsalis pedis pulses bilaterally. Right thumb with clicking.  Neurology:  3+ biceps, patellar and achilles reflexes bilaterally.  Skin:  No suspicious lesions.   Psychiatric:  Alert & oriented with normal affect and insight.  Patient does not appear depressed or anxious.      ASSESSMENT AND PLAN:   V70.0 Routine general medical examination at a health care facility  (primary encounter diagnosis)  Comment: Right shoulder pain, following with orthopedics. Right thumb with flexor tenosynovitis.  Plan: Healthy diet, exercise.  Colonoscopy. Continue to follow with orthopedics for right shoulder pain and thumb pain. Consider injection of tendon of right hand.  Return to clinic in 6 months for f/u HTN, or sooner if problems arise.  Nicole Cella. Tenny Craw, MD

## 2009-06-01 LAB — MRI SHOULDER RIGHT WO CONTRAST

## 2009-06-02 ENCOUNTER — Telehealth (HOSPITAL_BASED_OUTPATIENT_CLINIC_OR_DEPARTMENT_OTHER): Payer: Self-pay

## 2009-06-02 NOTE — Telephone Encounter (Signed)
Called patient to discuss scheduling a colonoscopy. Unable to reach patient.

## 2009-06-11 ENCOUNTER — Ambulatory Visit (HOSPITAL_BASED_OUTPATIENT_CLINIC_OR_DEPARTMENT_OTHER): Payer: PRIVATE HEALTH INSURANCE | Admitting: Orthopaedic Surgery

## 2009-06-11 ENCOUNTER — Encounter (HOSPITAL_BASED_OUTPATIENT_CLINIC_OR_DEPARTMENT_OTHER): Payer: Self-pay | Admitting: Orthopaedic Surgery

## 2009-06-11 DIAGNOSIS — M7540 Impingement syndrome of unspecified shoulder: Secondary | ICD-10-CM

## 2009-06-11 DIAGNOSIS — M25819 Other specified joint disorders, unspecified shoulder: Secondary | ICD-10-CM

## 2009-06-11 NOTE — Progress Notes (Signed)
This office note has been dictated. Account number [id number]

## 2009-06-14 ENCOUNTER — Encounter (HOSPITAL_BASED_OUTPATIENT_CLINIC_OR_DEPARTMENT_OTHER): Payer: Self-pay | Admitting: Internal Medicine

## 2009-06-18 LAB — ORTHOPEDIC OFFICE NOTE

## 2009-06-29 ENCOUNTER — Telehealth (HOSPITAL_BASED_OUTPATIENT_CLINIC_OR_DEPARTMENT_OTHER): Payer: Self-pay

## 2009-06-29 NOTE — Telephone Encounter (Signed)
Called patient to discuss scheduling a colonoscopy. Unable to reach patient. Message left with a family member.

## 2009-07-02 ENCOUNTER — Encounter (HOSPITAL_BASED_OUTPATIENT_CLINIC_OR_DEPARTMENT_OTHER): Payer: PRIVATE HEALTH INSURANCE

## 2009-07-02 ENCOUNTER — Ambulatory Visit (HOSPITAL_BASED_OUTPATIENT_CLINIC_OR_DEPARTMENT_OTHER): Payer: PRIVATE HEALTH INSURANCE

## 2009-07-20 ENCOUNTER — Other Ambulatory Visit (HOSPITAL_BASED_OUTPATIENT_CLINIC_OR_DEPARTMENT_OTHER): Payer: Self-pay | Admitting: Internal Medicine

## 2009-07-20 NOTE — Telephone Encounter (Signed)
Tina [name] is a 54 year old old female.    Patient's PCP: Nicole Cella. Tenny Craw, MD    In case we get disconnected what is the best number to reach you at today:  Home Phone [phone] (home)    Person calling:  Patient (self)    How can I help you today:   Medication Refill(s):    [facility] ([facility]    Phone: [phone] Fax: [phone]     Is this your pharmacy? Yes  What medications do you want to refill?  She needs amlodipine besylate 5 mg         Patient's language of care: [place]    Would you like an interpreter when the nurse calls you back?  YES  [place]

## 2009-07-20 NOTE — Telephone Encounter (Signed)
Apparently ran out of med as of today.needs refill today to covering provider.

## 2009-07-20 NOTE — Telephone Encounter (Signed)
Tina [name] is a 54 year old female calling to request a refill of amlodipine.  HTN Med:    Most Recent BP Reading(s)  05/28/2009 : 122/84  05/20/2009 : 165/82  04/02/2009 : 114/74    Documented patient preferred pharmacies:  [facility] ([facility])Phone: [phone] Fax: [phone]

## 2009-07-21 MED ORDER — AMLODIPINE BESYLATE 5 MG PO TABS
5.0000 mg | ORAL_TABLET | Freq: Every day | ORAL | Status: DC
Start: 2009-07-20 — End: 2010-01-14

## 2009-07-21 NOTE — Telephone Encounter (Signed)
To [facility] to call and notify patient that med sent.

## 2009-07-22 ENCOUNTER — Ambulatory Visit (HOSPITAL_BASED_OUTPATIENT_CLINIC_OR_DEPARTMENT_OTHER): Payer: PRIVATE HEALTH INSURANCE | Admitting: Hand Surgery

## 2009-07-26 ENCOUNTER — Telehealth (HOSPITAL_BASED_OUTPATIENT_CLINIC_OR_DEPARTMENT_OTHER): Payer: Self-pay

## 2009-07-26 NOTE — Telephone Encounter (Signed)
Reached pt and discussed all aspects of colonoscopy procedure including ride, clear liquids and prep. All instruction done with interpretur Reached pt and discussed all aspects of colonoscopy procedure including ride, clear liquids and prep. All instruction done with interpretur

## 2009-08-13 ENCOUNTER — Ambulatory Visit (HOSPITAL_BASED_OUTPATIENT_CLINIC_OR_DEPARTMENT_OTHER): Payer: PRIVATE HEALTH INSURANCE | Admitting: Internal Medicine

## 2009-08-17 ENCOUNTER — Ambulatory Visit (HOSPITAL_BASED_OUTPATIENT_CLINIC_OR_DEPARTMENT_OTHER): Payer: PRIVATE HEALTH INSURANCE | Admitting: Internal Medicine

## 2009-08-17 ENCOUNTER — Encounter (HOSPITAL_BASED_OUTPATIENT_CLINIC_OR_DEPARTMENT_OTHER): Payer: Self-pay | Admitting: Internal Medicine

## 2009-08-17 VITALS — BP 124/80 | HR 73 | Wt 143.0 lb

## 2009-08-17 DIAGNOSIS — I10 Essential (primary) hypertension: Secondary | ICD-10-CM

## 2009-08-17 DIAGNOSIS — R232 Flushing: Secondary | ICD-10-CM

## 2009-08-17 DIAGNOSIS — M7630 Iliotibial band syndrome, unspecified leg: Secondary | ICD-10-CM

## 2009-08-17 NOTE — Patient Instructions (Signed)
Foam Roller

## 2009-08-17 NOTE — Progress Notes (Signed)
[facility]    CC: leg pain, HTN, Hot flashes    HPI: Meghen Akopyan is a 54 year old year old female who presents to clinic to discuss leg pain, hot flashes, HTN. Visit done in portuguese/spanish.    Patient has noticed pain that goes from her left hip to her knee. Notices that she can't lie down on her left side and has to turn over on her right side. Not sure exactly how long this has been bothering her.  Doesn't run. 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    Patient taking prozac to help with mood swings and hot flashes with menopause. Feels so much better taking the prozac. Is very happy with it. Would like to continue.    Patient taking amlodipine for blood pressure. Has checked her BP outside of here and one time had SBP of 150. No CP.      Past Medical History    H/o Condyloma     Comment: 2003 (vulvar)    S/p appendectomy     S/p laparoscopic procedure     Comment: Exploratory         Patient Active Problem List:     Multiple Breast Cysts [610.0]     Menorrhagia [627.0]     Hypertension [401.9AH]     Hyperlipidemia LDL Goal < 130 [272.4CV]     Hot flashes not due to menopause [782.62V]     Tendonitis [726.90D]     Full dentures [V45.84E]     Sinusitis [473.9E]     Flexor tenosynovitis of finger [727.05R]      Allergies: See EPIC    Medications: see EPIC    Family/Social History: Documented in EPIC    Review of Systems   Gastrointestinal:        Hot flashes improved.   Musculoskeletal: Positive for joint pain.     Physical Exam   Constitutional: She is oriented to person, place, and time. No distress.   Cardiovascular: Normal rate and regular rhythm.    Musculoskeletal:        Pain to palpation over ileocrest. Full ROM. Strength intact.   Neurological: She is alert and oriented to person, place, and time.   Psychiatric: She has a normal mood and affect.   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   Plan: Recommended foam roller twice daily over ileotibial band. Consider PT if not improved.    782.62V Hot flashes not due to menopause  Comment: Taking Prozac- helping with mood and hot flashes.  Plan: Continue prozac.    401.9AH Hypertension  Comment: Taking amlodipine. Concerned about one outside reading of SBP 150.  Plan: Reassured that OK to have one higher reading. Last several readings here are well controlled. Will continue amlodipine.    Return to Clinic:  As previously scheduled.  Nicole Cella. Tenny Craw, MD

## 2009-08-18 ENCOUNTER — Other Ambulatory Visit (HOSPITAL_BASED_OUTPATIENT_CLINIC_OR_DEPARTMENT_OTHER): Payer: Self-pay | Admitting: Internal Medicine

## 2009-08-18 NOTE — Telephone Encounter (Signed)
Called pt to offer Care Coordination Program. Pt agreed to enroll in program.

## 2009-08-19 ENCOUNTER — Telehealth (HOSPITAL_BASED_OUTPATIENT_CLINIC_OR_DEPARTMENT_OTHER): Payer: Self-pay

## 2009-08-19 NOTE — Telephone Encounter (Signed)
Received Epic message that patient had questions about bowel preparation for her colonoscopy scheduled for next week. Unable to reach patient. Message left with contact # to call back.

## 2009-08-23 ENCOUNTER — Other Ambulatory Visit (HOSPITAL_BASED_OUTPATIENT_CLINIC_OR_DEPARTMENT_OTHER): Payer: Self-pay

## 2009-08-23 ENCOUNTER — Other Ambulatory Visit (HOSPITAL_BASED_OUTPATIENT_CLINIC_OR_DEPARTMENT_OTHER): Payer: Self-pay | Admitting: Internal Medicine

## 2009-08-23 NOTE — Telephone Encounter (Signed)
Called pt to talk about new instructions passed by GI nurse to help her to prepare for colonoscopy. Unable to reach pt. Left msg for her to call me or call Lynnete O'Neil at GI center.

## 2009-08-23 NOTE — Telephone Encounter (Signed)
Attempted to call patient at home and at work with a portuguese interpreter regarding bowel preparation for colonoscopy scheduled for 08/27/09. Unable to reach patient. Information given to [facility] liaison [facility]  Regarding bowel prep. Alternative is that patient could purchase Dulcolax (Bisacodyl) tablets at any pharmacy without a prescription and take 2 tablets mid-afternoon. She should then wait until she has a bowel movement and then start drinking the nulytely solution. She should drink 1/2 of the gallon of the Nulytely solution. She can drink it slowly so it does not make her sick to her stomach. If she does not move her bowels 4-6 hours after taking the dulcolax tablets she should start drinking the Nulytely solution anyway.  Tina [name] will continue to attempt to reach patient to provide her with this information.

## 2009-08-25 MED ORDER — PEG 3350-KCL-NA BICARB-NACL 420 G PO SOLR
ORAL | Status: AC
Start: 2009-08-23 — End: 2009-09-10

## 2009-08-27 ENCOUNTER — Ambulatory Visit (HOSPITAL_BASED_OUTPATIENT_CLINIC_OR_DEPARTMENT_OTHER): Payer: Self-pay | Admitting: Gastroenterology

## 2009-09-01 ENCOUNTER — Encounter (HOSPITAL_BASED_OUTPATIENT_CLINIC_OR_DEPARTMENT_OTHER): Payer: Self-pay | Admitting: Internal Medicine

## 2009-09-01 NOTE — Progress Notes (Signed)
DPH ATP SCORE ENTERED

## 2009-10-04 ENCOUNTER — Other Ambulatory Visit (HOSPITAL_BASED_OUTPATIENT_CLINIC_OR_DEPARTMENT_OTHER): Payer: Self-pay | Admitting: Internal Medicine

## 2009-10-04 DIAGNOSIS — R232 Flushing: Principal | ICD-10-CM

## 2009-10-04 MED ORDER — FLUOXETINE HCL 20 MG PO CAPS
20.0000 mg | ORAL_CAPSULE | Freq: Every morning | ORAL | Status: DC
Start: 2009-10-04 — End: 2010-03-31

## 2009-10-04 NOTE — Telephone Encounter (Signed)
[facility]    Person calling on behalf of patient: Patient (self)    May list multiple medications in this section  Medicine Name: FLUOXETINE  Dosage:   Frequency (how many pills, how many times a day):   Number of pills left:     Documented patient preferred pharmacies:  [facility] ([facility])Phone: [phone] Fax: [phone]      Patient's language of care: [place]    Patient needs a [place] interpreter.

## 2009-10-04 NOTE — Telephone Encounter (Signed)
Tina [name] is a 54 year old female has requested a refill of Fluoxetine.       Other Med Adult:  Most Recent BP Reading(s)  08/17/09 : 124/80        Cholesterol (mg/dl)   Date     Date  Value    01/25/2009  199    ----------    LDL (mg/dl)   Date     Date  Value    05/03/2007  174*   ----------    HDL (mg/dl)   Date     Date  Value    01/25/2009  30*   ----------    TRIGLYCERIDES (mg/dl)   Date     Date  Value    05/03/2007  48    ----------        THYROID SCREEN TSH (uIU/ml)   Date     Date  Value    11/23/2004  1.29    ----------        THYROID STIM HORMONE (uIU/mL)   Date     Date  Value    03/19/2009  1.05    ----------      No results found for this basename: hgba1c:1        No results found for this basename: INR:3       Documented patient preferred pharmacies:  [facility] ([facility])Phone: [phone] Fax: [phone]

## 2009-10-22 ENCOUNTER — Ambulatory Visit (HOSPITAL_BASED_OUTPATIENT_CLINIC_OR_DEPARTMENT_OTHER): Payer: PRIVATE HEALTH INSURANCE | Admitting: Internal Medicine

## 2009-10-22 VITALS — BP 110/60 | HR 88 | Temp 98.2°F | Wt 142.0 lb

## 2009-10-22 DIAGNOSIS — H6090 Unspecified otitis externa, unspecified ear: Secondary | ICD-10-CM

## 2009-10-22 DIAGNOSIS — I10 Essential (primary) hypertension: Secondary | ICD-10-CM

## 2009-10-22 DIAGNOSIS — R002 Palpitations: Secondary | ICD-10-CM

## 2009-10-22 DIAGNOSIS — R42 Dizziness and giddiness: Secondary | ICD-10-CM

## 2009-10-22 LAB — EKG

## 2009-10-22 MED ORDER — NEOMYCIN-POLYMYXIN-HC 1 % OT SOLN
4.00 [drp] | Freq: Four times a day (QID) | OTIC | Status: AC
Start: 2009-10-22 — End: 2009-10-27

## 2009-10-22 NOTE — Progress Notes (Signed)
SUBJECTIVE:    [name] is a 54 y.o who presents to clinic as a walk in with c/c of bilateral ear pain,  dizziness, palpitations, nausea x 3 days    Will have palpitations in the morning, also on exertion. Has occurred in the past. Is concerned in regards to the intensity of the palpitations, states " I feel like my heart is in my throat"  Dizziness- reports improvement of the dizziness after taking her Norvasc    Denies fevers, chills,  chest pain, sob, n/v, abdominal pain.     Denies continuation of palpitations and dizziness since finding out her EKG is normal today in clinic        OBJECTIVE:  General: A+0x3,NAD  BP 110/60  Pulse 88  Temp(Src) 98.2 F (36.8 C) (Oral)  Wt 142 lb (64.411 kg)  SpO2 93%  HEENT: NT/AT PERRLA, no conjunctivial injection noted, frontal and maxillary sinus non tender. Nares patent, no erythema noted, no nasal drainage,discharge, lesions noted. Left external canal with moderate erythema noted,  TM intact, no perforation or effusion noted bilat, right external canal is normal, Throat clear,MMM, no exudate noted.  NECK: supple, no nodules, negative lymphadenopathy  CHEST: Clear to auscultation, no wheezes or rales.  CV: RRR, S1, S2 WNL, no murmurs      ASSESSMENT/PLAN:  785.1 Palpitations  (primary encounter diagnosis)  Comment:  Could be related to stress, anxiety  Hx of HTN and tachycardia  Last TSH normal  To continue with Norvasc as directed  EKG normal  Will refer to Cardiopulmonary for Holter monitor.   Plan: EKG ** DONE BY NURSE OR MD **, REFERRAL TO         CARDIO-PULMONARY LAB (INT)            401.9AH Hypertension  Comment: Bp stable  To continue with Norvasc as directed  Plan:     380.32F Otitis externa  Comment: of left ear  Plan: NEOMYCIN-POLYMYXIN-HC, OTIC, (CORTISPORIN) 1 %         SOLN Otic Solution            780.4A Dizziness  Comment:   Could be due to infection of left ear  Will continue to monitor  Advised pt to report continuing or worsening  symptoms. Pt agreed to plan.  Plan:       I have spent 25 minutes in face to face time with this patient/patient proxy of which > 50% was in counseling or coordination of care regarding above issues/Dx.

## 2009-11-09 ENCOUNTER — Encounter (HOSPITAL_BASED_OUTPATIENT_CLINIC_OR_DEPARTMENT_OTHER): Payer: Self-pay | Admitting: Internal Medicine

## 2009-11-19 ENCOUNTER — Encounter (HOSPITAL_BASED_OUTPATIENT_CLINIC_OR_DEPARTMENT_OTHER): Payer: Self-pay | Admitting: Internal Medicine

## 2009-11-19 ENCOUNTER — Ambulatory Visit (HOSPITAL_BASED_OUTPATIENT_CLINIC_OR_DEPARTMENT_OTHER): Payer: PRIVATE HEALTH INSURANCE | Admitting: Internal Medicine

## 2009-11-19 VITALS — BP 118/80 | HR 76 | Wt 139.0 lb

## 2009-11-19 DIAGNOSIS — M65949 Unspecified synovitis and tenosynovitis, unspecified hand: Secondary | ICD-10-CM

## 2009-11-19 DIAGNOSIS — R1030 Lower abdominal pain, unspecified: Principal | ICD-10-CM

## 2009-11-19 DIAGNOSIS — M659 Synovitis and tenosynovitis, unspecified: Secondary | ICD-10-CM

## 2009-11-19 MED ORDER — NAPROXEN 500 MG PO TABS
500.0000 mg | ORAL_TABLET | Freq: Two times a day (BID) | ORAL | Status: DC
Start: 2009-11-19 — End: 2010-01-14

## 2009-11-19 NOTE — Progress Notes (Signed)
[facility]    CC: Groin Pain. Finger pain.     HPI: Clifford Coudriet is a 54 year old year old female who presents to clinic to discuss Groin Pain. Finger pain.     Groin Pain- patient has been feeling pain in her groin. Feels like her hip is "tight". Not used to a lot of exercise. No specific injury. No rashes.    Finger pain- Still with clicking when she flexes/extends her right thumb. Painful. Bothersome. Never made it to her appt with orthopedics.      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     Physical Exam   Constitutional: She is oriented to person, place, and time. No distress.   Musculoskeletal:        No pain with palpation of bilateral groin. No LAD. Full ROM. Gait normal. No LAD   Neurological: She is alert and oriented to person, place, and time.   Psychiatric: She has a normal mood and affect.   BP 118/80  Pulse 76  Wt 139 lb (63.05 kg)  SpO2 97%    ASSESMENT/PLAN:  789.00BC Groin pain  (primary encounter diagnosis)  Comment: May be due to tight hip flexors.  Plan: REFERRAL TO PHYSICAL THERAPY (INT), if not improved with PT will do more work-up/referral.    727.05R Flexor tenosynovitis of finger  Comment: Right thumb. Never saw orthopedics previously- missed appt.  Plan: REFERRAL TO ORTHOPEDICS (INT) for possible injection.    V76.12B Screening mammogram  Comment: Due  Plan:  ORDER FOR MAMMOGRAM (SCREENING)    Return to Clinic:  In 3 months for f/u groin pain, or sooner if problems arise.   Vivia Ewing, MD

## 2009-11-26 ENCOUNTER — Other Ambulatory Visit (HOSPITAL_BASED_OUTPATIENT_CLINIC_OR_DEPARTMENT_OTHER): Payer: Self-pay | Admitting: Internal Medicine

## 2009-12-07 ENCOUNTER — Encounter (HOSPITAL_BASED_OUTPATIENT_CLINIC_OR_DEPARTMENT_OTHER): Payer: PRIVATE HEALTH INSURANCE | Admitting: Hand Surgery

## 2009-12-10 ENCOUNTER — Ambulatory Visit (HOSPITAL_BASED_OUTPATIENT_CLINIC_OR_DEPARTMENT_OTHER): Payer: PRIVATE HEALTH INSURANCE | Admitting: Nurse Practitioner

## 2009-12-10 ENCOUNTER — Encounter (HOSPITAL_BASED_OUTPATIENT_CLINIC_OR_DEPARTMENT_OTHER): Payer: Self-pay | Admitting: Internal Medicine

## 2009-12-10 VITALS — BP 122/78 | Temp 97.8°F

## 2009-12-10 DIAGNOSIS — N6009 Solitary cyst of unspecified breast: Principal | ICD-10-CM

## 2009-12-10 NOTE — Progress Notes (Signed)
This office note has been dictated. Account number [id number]

## 2009-12-21 ENCOUNTER — Encounter (HOSPITAL_BASED_OUTPATIENT_CLINIC_OR_DEPARTMENT_OTHER): Payer: Self-pay | Admitting: Internal Medicine

## 2009-12-24 ENCOUNTER — Other Ambulatory Visit (HOSPITAL_BASED_OUTPATIENT_CLINIC_OR_DEPARTMENT_OTHER): Payer: Self-pay | Admitting: Internal Medicine

## 2009-12-24 LAB — MA SCREENING MAMMO BILATERAL WITH CAD

## 2009-12-24 LAB — MA DIAGNOSTIC MAMMO UNILATERAL LEFT WITH CAD

## 2010-01-07 ENCOUNTER — Ambulatory Visit (HOSPITAL_BASED_OUTPATIENT_CLINIC_OR_DEPARTMENT_OTHER): Payer: PRIVATE HEALTH INSURANCE | Admitting: Nurse Practitioner

## 2010-01-13 LAB — TCH BREAST CTR CLINIC NOTE

## 2010-01-14 ENCOUNTER — Other Ambulatory Visit (HOSPITAL_BASED_OUTPATIENT_CLINIC_OR_DEPARTMENT_OTHER): Payer: Self-pay | Admitting: Ambulatory Care

## 2010-01-14 MED ORDER — NAPROXEN 500 MG PO TABS
500.0000 mg | ORAL_TABLET | Freq: Two times a day (BID) | ORAL | Status: AC
Start: 2010-01-14 — End: 2010-02-13

## 2010-01-14 MED ORDER — AMLODIPINE BESYLATE 5 MG PO TABS
5.0000 mg | ORAL_TABLET | Freq: Every day | ORAL | Status: DC
Start: 2010-01-14 — End: 2010-07-15

## 2010-01-14 NOTE — Telephone Encounter (Signed)
Message copied by Windell Moulding on Fri Jan 14, 2010 11:36 AM  ------       Message from: Lorenza Chick       Created: Fri Jan 14, 2010 11:27 AM       Regarding: to speak with provider       Contact: [phone]         Tina [name] is a 54 year old old female.              Patient's PCP: Vivia Ewing, MD              In case we get disconnected what is the best number to reach you at today:       Home Phone [phone] (home)              Person calling:       Patient (self)              How can I help you today:        Other pt. walked in today. She said she needs to speak with provider because she needs a refill of Naproxen. She said that the doctor told her to call her directly to speak with her or her nurse if she need refills. She said the medication works very well for her but since she has been out of it now the pain on her back is coming back now. Also she said she has 0 medication for her blood pressure - amlodipine. She wants to know if both can be sent today to her phar,acy. She would like a call back to let her know.               Patient's language of care: [place]              Would you like an interpreter when the nurse calls you back?       YES  [place]

## 2010-01-14 NOTE — Telephone Encounter (Signed)
Will forward request to pcp.

## 2010-01-21 ENCOUNTER — Encounter (HOSPITAL_BASED_OUTPATIENT_CLINIC_OR_DEPARTMENT_OTHER): Payer: Self-pay

## 2010-01-28 ENCOUNTER — Ambulatory Visit (HOSPITAL_BASED_OUTPATIENT_CLINIC_OR_DEPARTMENT_OTHER): Payer: PRIVATE HEALTH INSURANCE | Admitting: Internal Medicine

## 2010-01-28 VITALS — BP 114/70 | HR 86 | Wt 142.0 lb

## 2010-01-28 DIAGNOSIS — M779 Enthesopathy, unspecified: Secondary | ICD-10-CM

## 2010-01-28 DIAGNOSIS — M542 Cervicalgia: Secondary | ICD-10-CM

## 2010-01-28 DIAGNOSIS — J329 Chronic sinusitis, unspecified: Secondary | ICD-10-CM

## 2010-01-28 DIAGNOSIS — Z23 Encounter for immunization: Secondary | ICD-10-CM

## 2010-01-28 MED ORDER — FLUTICASONE PROPIONATE 50 MCG/ACT NA SUSP
2.0000 | Freq: Every day | NASAL | Status: AC
Start: 2010-01-28 — End: 2010-07-27

## 2010-01-28 MED ORDER — CELECOXIB 100 MG PO CAPS
100.00 mg | ORAL_CAPSULE | Freq: Two times a day (BID) | ORAL | Status: AC | PRN
Start: 2010-01-28 — End: 2010-03-30

## 2010-01-28 NOTE — Progress Notes (Signed)
Tina [name] is a 54 year old female who presents with the following problems:    Neck pain:  Reports a h/o chronic back of the neck pain that is intermittent, present for the last 6 years. 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 Now she mentions that the pain recurs about every month and when present lasts for several days at a time. It gives her a "tired" feeling, it is 2/10 in intensity and radiates down to her left arm on occasions. This improves with naprosyn but naprosyn has been causing significant GI discomfort.    Right finger stiffness:  Pt has a h/o first right finger blockage and decreased range of motion. This problem started last year suddently with some associated stiffness, then gradually had decreased range of motion and now "locks" when she tries to flex it. She was supposed to see a hand specialist but was unable to make it do to her job.    ROS:H/o leg pain treated successfully with NSAIDs, h/o tensosinovitis. No fever, chills, no joint swelling, no tingling or decreased sensation.    Recent MRI of Right shoulder showed:   1. Supraspinatus tendinopathy and small partial-thickness tear.  2. Mild degenerative changes    Social History Narrative    Immigrated from [place] in 2003    Review of Patient's Allergies indicates:   Nkda (no known drug*        Current outpatient prescriptions ordered prior to encounter:  amlodipine (NORVASC) 5 MG tablet Take 1 tablet by mouth daily. Disp: 30 tablet Rfl: 5   naproxen (NAPROSYN) 500 MG tablet Take 1 tablet by mouth 2 (two) times daily with meals. Disp: 60 tablet Rfl: 0   fluoxetine (PROZAC) 20 MG capsule Take 1 capsule by mouth every morning. Disp: 30 capsule Rfl: 5         OBJECTIVE:  BP 114/70  Pulse 86  Wt 142 lb (64.411 kg)  SpO2 97%  General: seated comfortable in NAD, alert, pleasant and cooperative  Neuro: alert & oriented x 3, no dysarthria or aphasia, gait wnl  Neck is supple, normal range of motion but some  pain with extension.  Shoulder exam - both sides normal; full range of motion, no pain on motion, no tenderness or deformity noted.  Right great finger has significant decreased range of motion and stiffness, the patient cannot grab a pen nor make a fist.  Joint exam is negative for swelling, erythema or warmth.      A/P:  723.1B Neck pain  (primary encounter diagnosis)  Comment:  Likely from degenerative use, pt advise to do stretching exercises, apply heat pad as needed, she cannot tolerate ibuprofen nor naprosyn, and only has these symptoms by episodes. Will recommend a short course of celebrex and evaluation by ortho.  We discussed the use of the NSAID prescribed in detail.  I explained the specific side effects associated with celebrex including increased cardiovascular risk. Pt will not take this medication for a prolonged period of time.    Plan: celecoxib (CELEBREX) 100 MG capsule, REFERRAL         TO ORTHOPEDICS ( INT)    726.90D Tendonitis  Comment: Noted signs for peripheral neuropathy and pain, pt will likely need an intervention from a hand specialist, will give referral today.  Plan: REFERRAL TO ORTHOPEDICS ( INT)    V82.9A Screening  Comment: We had a long discussion about colon cancer screening, pt has had this discussion with her previous PCP  and is still concerned about doing a colonoscopy. Will guaiac her stools while she decides to do a colonoscopy.  I have spent 15 minutes in face to face time with this patient/patient proxy of which > 50% was in counseling or coordination of care regarding above issues/Dx.      Plan: FECAL OCCULT (POINT OF CARE) HOME TEST 3 CARDS    473.9E Sinusitis  Comment: Stable, needs refill of her medication  Plan: fluticasone (FLONASE) 50 MCG/ACT nasal spray    V04.81 Need for prophylactic vaccination and inoculation against influenza  Plan: IMMUNIZATION ADMIN SINGLE, RN, FLU VACC SPLIT         VIRUS 3 & > (PUBLIC)

## 2010-01-28 NOTE — Progress Notes (Signed)
Pt requesting Flu Vaccine . Confirmed patient's name and date of birth.  Pt denies allergies to this vaccine.  Pt denies allergies to egg or egg products.  Pt denies allergy to contact lens solution.  Pt denies pregnancy.    Pt denies adverse effects from previous administration of this medication. Pt denies history of Guillain-Barre' syndrome.  Pt denies moderate/severe illness at this time.  Risks and benefits of Flu Vaccine reviewed with pt.   VIS for Flu Vaccine dated 12-22-2008 offered and reviewed with pt.    Flu Vaccine 0.5.ml IM administered. Tolerated well by patient.  Patient denies adverse effects from injection at this time.   Patient encouraged to utilize arm and not favor it.  Patient informed may  take pain reliever of choice and to apply ice for discomfort if necessary.  Patient will call with any questions or concerns.  Please refer to Imm./Inj. section for administration site, lot # and exp. date.  Patient was encouraged to wait for twenty minutes in the lobby.

## 2010-02-10 NOTE — Progress Notes (Addendum)
Addended by: Carlyn Reichert on: 02/10/2010      Modules accepted: Level of Service

## 2010-02-18 ENCOUNTER — Ambulatory Visit (HOSPITAL_BASED_OUTPATIENT_CLINIC_OR_DEPARTMENT_OTHER): Payer: PRIVATE HEALTH INSURANCE | Admitting: Internal Medicine

## 2010-02-18 ENCOUNTER — Ambulatory Visit (HOSPITAL_BASED_OUTPATIENT_CLINIC_OR_DEPARTMENT_OTHER): Payer: PRIVATE HEALTH INSURANCE | Admitting: Physical Medicine & Rehabilitation

## 2010-02-18 DIAGNOSIS — M542 Cervicalgia: Secondary | ICD-10-CM

## 2010-02-18 DIAGNOSIS — M4600 Spinal enthesopathy, site unspecified: Principal | ICD-10-CM

## 2010-02-18 MED ORDER — METHYLPREDNISOLONE ACETATE 20 MG/ML IJ SUSP
20.00 mg | INTRAMUSCULAR | Status: AC
Start: 2010-02-18 — End: 2010-02-19

## 2010-02-18 MED ORDER — BUPIVACAINE HCL 0.25 % IJ SOLN
4.00 mL | INTRAMUSCULAR | Status: AC
Start: 2010-02-18 — End: 2010-02-19

## 2010-02-18 NOTE — Progress Notes (Signed)
The primary progress note for this visit has been dictated through E-Scription. It can be viewed as an attachment to this encounter or through Chart Review under the Other Tab as an Orthopedic Office Note.      Review of Systems: Constitutional, Eyes, ENT/Mouth, Cardiovascular, Respiratory, GI, GU, Neuro, Psych, Heme/Lymph, Skin, Musculoskeletal was reviewed and is NEGATIVE except for what is dictated in the note.    MT ACCT #:  [id number]

## 2010-02-19 LAB — ORTHOPEDIC OFFICE NOTE

## 2010-02-22 ENCOUNTER — Other Ambulatory Visit (HOSPITAL_BASED_OUTPATIENT_CLINIC_OR_DEPARTMENT_OTHER): Payer: PRIVATE HEALTH INSURANCE | Admitting: Lab

## 2010-02-22 ENCOUNTER — Encounter (HOSPITAL_BASED_OUTPATIENT_CLINIC_OR_DEPARTMENT_OTHER): Payer: Self-pay | Admitting: Internal Medicine

## 2010-02-22 DIAGNOSIS — Z0000 Encounter for general adult medical examination without abnormal findings: Secondary | ICD-10-CM

## 2010-02-22 LAB — FECAL OCCULT (POINT OF CARE) HOME TEST 3 CARDS
LOT #: 711
OCCULT BLOOD: NEGATIVE
OCCULT BLOOD: NEGATIVE
OCCULT BLOOD: NEGATIVE

## 2010-02-22 NOTE — Progress Notes (Signed)
Lab done.

## 2010-03-31 ENCOUNTER — Other Ambulatory Visit (HOSPITAL_BASED_OUTPATIENT_CLINIC_OR_DEPARTMENT_OTHER): Payer: Self-pay | Admitting: Internal Medicine

## 2010-03-31 DIAGNOSIS — R232 Flushing: Principal | ICD-10-CM

## 2010-03-31 MED ORDER — FLUOXETINE HCL 20 MG PO CAPS
20.0000 mg | ORAL_CAPSULE | Freq: Every morning | ORAL | Status: DC
Start: 2010-03-31 — End: 2010-09-23

## 2010-03-31 NOTE — Telephone Encounter (Signed)
[facility]    Person calling on behalf of patient: Patient (self)    May list multiple medications in this section  Medicine Name: Prozac - she has 0 left and no refills.  Dosage:   Frequency (how many pills, how many times a day):   Number of pills left:     Documented patient preferred pharmacies:  [facility] ([facility])Phone: [phone] Fax: [phone]  Patient's language of care: [place]    Patient does not need an interpreter.

## 2010-03-31 NOTE — Telephone Encounter (Signed)
Needs ASAP has none left .   To provider to review and fill if appropriate .

## 2010-04-15 ENCOUNTER — Ambulatory Visit (HOSPITAL_BASED_OUTPATIENT_CLINIC_OR_DEPARTMENT_OTHER): Payer: PRIVATE HEALTH INSURANCE | Admitting: Internal Medicine

## 2010-04-15 VITALS — BP 128/74 | HR 77 | Temp 98.7°F | Wt 145.0 lb

## 2010-04-15 DIAGNOSIS — L989 Disorder of the skin and subcutaneous tissue, unspecified: Secondary | ICD-10-CM

## 2010-04-15 DIAGNOSIS — G4700 Insomnia, unspecified: Secondary | ICD-10-CM

## 2010-04-15 DIAGNOSIS — M779 Enthesopathy, unspecified: Secondary | ICD-10-CM

## 2010-04-15 MED ORDER — DIPHENHYDRAMINE HCL 25 MG PO CAPS
25.00 mg | ORAL_CAPSULE | Freq: Every evening | ORAL | Status: AC | PRN
Start: 2010-04-15 — End: 2010-07-15

## 2010-04-15 NOTE — Progress Notes (Signed)
Tina [name] is a 54 year old female who presents with the following problems:    Skin lesion:  Pt mentions a bleeding lesion on the top of her nose present for about a year. She is concerned that this could be cancer because it is not healing. She does have a strong family h/o skin cancer. She admits to have had significant sun exposure when she was younger.    Tendonitis:  See problem list for details. Pt was sent to see the hand specialist but was not able to keep the appointment because of work. Today she wishes to have this rescheduled. Her right thumb stiffness has remained unchanged and is not expanding. There is only mild pain associated with this.    Insomnia:  Pt reports a h/o insomnia ever since she had her menopause. She doesn't have trouble falling asleep, but usually wakes up very early and is unable to fall asleep again. She doesn't feel refreshed in the morning. Pt was started on Prozac 5 months ago which was effective initially but seems to have no effect now.     ROS: She denies feeling sad, anxious or depressed. She denies any hot flushes.    Social History Narrative    Immigrated from [place] in 2003    Review of Patient's Allergies indicates:   Nkda (no known drug*        Current outpatient prescriptions ordered prior to encounter:  fluoxetine (PROZAC) 20 MG capsule Take 1 capsule by mouth every morning. Disp: 30 capsule Rfl: 5   celecoxib (CELEBREX) 100 MG capsule Take 1 capsule by mouth 2 (two) times daily as needed for Pain. Disp: 60 capsule Rfl: 1   fluticasone (FLONASE) 50 MCG/ACT nasal spray 2 sprays by Each Nostril route daily. Disp: 1 Bottle Rfl: 5   amlodipine (NORVASC) 5 MG tablet Take 1 tablet by mouth daily. Disp: 30 tablet Rfl: 5   naproxen (NAPROSYN) 500 MG tablet Take 1 tablet by mouth 2 (two) times daily with meals. Disp: 60 tablet Rfl: 0         OBJECTIVE:  BP 128/74  Pulse 77  Temp(Src) 98.7 F (37.1 C) (Oral)  Wt 145 lb (65.772 kg)  SpO2 99%  General: seated  comfortable in NAD, alert, pleasant and cooperative  Neuro: alert & oriented x 3, no dysarthria or aphasia, gait wnl  Moist mucus membranes.  Lesion on the nose bridge is hyperpigmented, 0.3 mm with a small erythematous opening, no vascularity or pearly appearance was noted.  Chest is clear, no wheezing or rales. Normal symmetric air entry throughout both lung fields. No chest wall deformities or tenderness.  Heart rate is regular w/o any murmurs.  Right thumb is not tender to palpation, no erythema or swelling noted. There is significant decrease in the range of motion, but unchanged from previous exam.      A/P:  709.9BM Skin lesion of face (primary encounter diagnosis)  Comment: This is suspicious for basal cell carcinoma. Because of the location we will try to give her an external referral to dermatology. If no insurance coverage is found, we will do referral to derm procedure clinic. F/u in one month.  Plan: REFERRAL TO DERMATOLOGY (EXT)    726.90D Tendonitis  Comment: This has remained unchanged. Pt interested in rescheduling a f/u appt with the hand specialist will give another referral today.  Plan: REFERRAL TO ORTHOPEDICS ( INT)    780.52A Insomnia  Comment: Sleep hygiene was thought, although this seems to occur  in the middle of the night. Pt agreed to use benadryl for a few days, then stop to see if this is a temporary problem. If her problems persist we may consider increasing the dose of the prozac. F/u in one month.  Plan: diphenhydrAMINE (BENADRYL) 25 MG capsule

## 2010-04-19 ENCOUNTER — Telehealth (HOSPITAL_BASED_OUTPATIENT_CLINIC_OR_DEPARTMENT_OTHER): Payer: Self-pay | Admitting: Internal Medicine

## 2010-04-19 NOTE — Telephone Encounter (Signed)
Called patient to inform that it is possible that she will get coverage from [facility] dermatology but that she has to register first. Pt mentions that she already has an [facility] number. She will therefore call the derm clinic directly at [phone].

## 2010-05-17 ENCOUNTER — Ambulatory Visit (HOSPITAL_BASED_OUTPATIENT_CLINIC_OR_DEPARTMENT_OTHER): Payer: PRIVATE HEALTH INSURANCE

## 2010-05-17 DIAGNOSIS — M6530 Trigger finger, unspecified finger: Principal | ICD-10-CM

## 2010-05-17 LAB — XR HAND RIGHT MINIMUM 3 VIEWS

## 2010-05-17 MED ORDER — BUPIVACAINE HCL 0.5 % IJ SOLN
0.25 mL | Freq: Once | INTRAMUSCULAR | Status: AC
Start: 2010-05-17 — End: 2010-05-17

## 2010-05-17 MED ORDER — METHYLPREDNISOLONE ACETATE 20 MG/ML IJ SUSP
20.00 mg | Freq: Once | INTRAMUSCULAR | Status: AC
Start: 2010-05-17 — End: 2010-05-17

## 2010-05-17 NOTE — Progress Notes (Signed)
The primary progress note for this visit has been dictated through E-Scription. It can be viewed as an attachment to this encounter or through Chart Review under the Other Tab as an Orthopedic Office Note.      Review of Systems: Constitutional, Eyes, ENT/Mouth, Cardiovascular, Respiratory, GI, GU, Neuro, Psych, Heme/Lymph, Skin, Musculoskeletal was reviewed and is NEGATIVE except for what is dictated in the note.    MT ACCT #:  [id number] PATIENT/PROCEDURE VERIFICATION DOCUMENTATION    Correct patient: Yes  Correct procedure: Yes  Correct site, mark visible if applicable: Yes    Pre-procedure Vital Signs:  BP: N/A  P: N/A  R: N/A    Risks and Benefits reviewed: Yes  Side: Right thumb  Correct position: Yes  Special equipment/implant(s) present, if applicable: Yes    Post-procedure Vitals Signs:  BP: N/A  P: N/A  R: N/A    Time-out completed, documented by provider doing procedure or designated team member:  Karyl Sharrar D. Janee Morn, PA-C    05/17/2010    11:43 AM

## 2010-05-19 ENCOUNTER — Telehealth (HOSPITAL_BASED_OUTPATIENT_CLINIC_OR_DEPARTMENT_OTHER): Payer: Self-pay | Admitting: Ambulatory Care

## 2010-05-19 NOTE — Telephone Encounter (Signed)
Message copied by Audrie Gallus on Thu May 19, 2010 12:33 PM  ------       Message from: Lorenza Chick       Created: Thu May 19, 2010 12:21 PM       Regarding: to speak with Sloan Leiter: [phone]         Tina [name] is a 55 year old old female.              Patient's PCP: Carlyn Reichert, MD              In case we get disconnected what is the best number to reach you at today:       Home Phone [phone] (home)              Person calling:       Patient (self)              How can I help you today:        Other she said she was told by Eber Jones that she needs to have a biopsy done at [facility] but she didn't hear anything since then and she doesn't know what she should do. She would like a call back.               Patient's language of care: [place]              Would you like an interpreter when the nurse calls you back?       Yes portuguese

## 2010-05-19 NOTE — Telephone Encounter (Signed)
She has called [facility] who told her the soonest appoint is in March .   Told her to call them and ask for sooner appoint even a cancellation . She had lost the number . Found number previous encounter . Gave her number .     Also wants follow up appoint for other issues , not urgent . Wants it the first Friday available .   Appoint made for January 27 th .     Will forward to carol for review .

## 2010-06-10 ENCOUNTER — Ambulatory Visit (HOSPITAL_BASED_OUTPATIENT_CLINIC_OR_DEPARTMENT_OTHER): Payer: PRIVATE HEALTH INSURANCE | Admitting: Internal Medicine

## 2010-06-17 ENCOUNTER — Ambulatory Visit (HOSPITAL_BASED_OUTPATIENT_CLINIC_OR_DEPARTMENT_OTHER): Payer: PRIVATE HEALTH INSURANCE | Admitting: Internal Medicine

## 2010-06-17 VITALS — BP 120/70 | HR 90 | Wt 148.0 lb

## 2010-06-17 DIAGNOSIS — H9201 Otalgia, right ear: Secondary | ICD-10-CM

## 2010-06-17 DIAGNOSIS — E785 Hyperlipidemia, unspecified: Secondary | ICD-10-CM

## 2010-06-17 DIAGNOSIS — H543 Unqualified visual loss, both eyes: Secondary | ICD-10-CM

## 2010-06-17 DIAGNOSIS — G44229 Chronic tension-type headache, not intractable: Principal | ICD-10-CM

## 2010-06-17 DIAGNOSIS — M542 Cervicalgia: Secondary | ICD-10-CM

## 2010-06-17 HISTORY — DX: Otalgia, right ear: H92.01

## 2010-06-17 LAB — CHG LIPOPROTEIN DIRECT MEASUREMENT LDL CHOLESTEROL: LOW DENSITY LIPOPROTEIN DIRECT: 182 mg/dl — ABNORMAL HIGH (ref 0–100)

## 2010-06-17 LAB — CHG LIPOPROTEIN DIR MEAS HIGH DENSITY CHOLESTEROL: HIGH DENSITY LIPOPROTEIN: 52 mg/dl (ref 35–85)

## 2010-06-17 LAB — CHOLESTEROL: Cholesterol: 247 mg/dl — ABNORMAL HIGH (ref 0–200)

## 2010-06-17 MED ORDER — IBUPROFEN 800 MG PO TABS
800.00 mg | ORAL_TABLET | Freq: Four times a day (QID) | ORAL | Status: AC | PRN
Start: 2010-06-17 — End: 2010-09-15

## 2010-06-17 NOTE — Progress Notes (Signed)
Tina [name] is a 55 year old female who presents with the following problems:    Headaches:  \Tina [name] has a long h/o headaches, they are bifrontal, radiate to the back of her neck causing significant stiffness and pain. She is s/p steroid injections in the neck which helped significantly but the pain has recurred, she whishes to be referred to have these reapplied. She is taking ibuprofen which helps a lot. The headache does wake her up at night, she has to take ibuprofen at 6 am in order to be able to function the rest of the day. She has had a headache every day this week.    Right ear pain:  Pt mentions significant right inner ear pain. This has been bothering her for many years but has become worse recently. She mentions a sensation of fullness. She has been treated both with external otic Abx as well as oral amoxicillin w/o any success. She feels as though she is having decreased hearing.    Hyperlipidemia:  Pt mentions that she is concerned because she has been gaining weight. She is postmenopausal but currently has a Mirena IUD because of recent urterine bleeding. She is not exercising but is trying to have a low fat diet.  LDL (mg/dl)   Date     Date  Value    05/03/2007  174*    05/30/2006  170*    08/01/2005  150*   HDL (mg/dl)   Date     Date  Value    01/25/2009  30*    05/03/2007  50     05/30/2006  36      ROS: Denies polyuria, polydipsia, polyphagia. Positive weight gain as above. She has occasional nauseas with these headaches. Positive h/o sinusitis and rhinorrhea. Pt also reports decreased vision in both eyes, she hasn't seen ophtho in a long time.    Social History Narrative    Immigrated from [place] in 2003    Review of Patient's Allergies indicates:   Nkda (no known drug*            Current outpatient prescriptions ordered prior to encounter:  diphenhydrAMINE (BENADRYL) 25 MG capsule Take 1 capsule by mouth nightly as needed for Sleep. Disp: 30 capsule Rfl: 2   fluoxetine (PROZAC) 20  MG capsule Take 1 capsule by mouth every morning. Disp: 30 capsule Rfl: 5   fluticasone (FLONASE) 50 MCG/ACT nasal spray 2 sprays by Each Nostril route daily. Disp: 1 Bottle Rfl: 5   amlodipine (NORVASC) 5 MG tablet Take 1 tablet by mouth daily. Disp: 30 tablet Rfl: 5         OBJECTIVE:  BP 120/70  Pulse 90  Wt 148 lb (67.132 kg)  SpO2 98%  General: seated comfortable in NAD, alert, pleasant and cooperative  EOMI, PERLA, no nystagums  There is mild sinus tenderness to maxillary percussion.  Moist mucus membranes  Oropharynx is clear.  Examination of both ears reveals air fluid levels and opacification of the right TM. Pt has decreased hearing of finger rub on both sides L>R. There is mild pain with pulling of the righ pinna, no pain pulling of the left pinna.  Neck is supple, w/o lymphadenopathy. The referred pain is on the back of her neck but does not affect range of motion. No erythema or swelling noted.  Chest is clear, no wheezing or rales. Normal symmetric air entry throughout both lung fields. No chest wall deformities or tenderness.  Heart rate is regular  w/o any murmurs.      A/P:  339.12H Tension headache, chronic  (primary encounter diagnosis)  Comment: Ddx tension vs sinus related headaches. Pt has significant improvement with ibuprofen and these only come by episodes before improving completely. Recommended therefore to continue ibuprofen PRN and to call the clinic if these persist.  Plan: ibuprofen (ADVIL,MOTRIN) 800 MG tablet    272.4CV Hyperlipidemia LDL goal < 130  Comment: Low fat diet emphasized. Pt will f/u with nutrition.  Plan: ROUTINE VENIPUNCTURE, CHOLESTEROL, HIGH DENSITY        LIPOPROTEIN, LOW DENSITY LIPOPROTEIN,DIRECT    723.1B Neck pain  Comment: Pt has a h/o neck bursitis which had improved after steroid injections, I also noticed that her headaches are causing some stiffness in her neck, she was advised about doing PT which she is interested in starting, she also wishes to be seen  again by ortho to have another steroid injection.  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 Will refer today.  Plan: REFERRAL TO OPHTHALMOLOGY ( INT)    388.70Y Right ear pain  Comment: Noticed significant fluid levels in the right TM as well as opacification. She has failed previous management. Will refer to ENT today.  Plan: REFERRAL TO ENT ( INT)    I have spent 25 minutes in face to face time with this patient/patient proxy of which > 50% was in counseling or coordination of care regarding above issues/Dx.

## 2010-06-20 ENCOUNTER — Telehealth (HOSPITAL_BASED_OUTPATIENT_CLINIC_OR_DEPARTMENT_OTHER): Payer: Self-pay | Admitting: Internal Medicine

## 2010-06-20 DIAGNOSIS — E785 Hyperlipidemia, unspecified: Principal | ICD-10-CM

## 2010-06-20 MED ORDER — PRAVASTATIN SODIUM 20 MG PO TABS
20.0000 mg | ORAL_TABLET | Freq: Every day | ORAL | Status: DC
Start: 2010-06-20 — End: 2010-07-07

## 2010-06-20 NOTE — Telephone Encounter (Signed)
Called patient to inform that her cholesterol is very high. I will start pravastatin for her and will send a referral to the nutritionist. Pt informed that she will be called by the front desk about her f/u with the nutritionist. She agrees with plan.

## 2010-06-21 NOTE — Telephone Encounter (Addendum)
Addended by: Carlyn Reichert on: 06/21/2010      Modules accepted: Orders

## 2010-06-21 NOTE — Telephone Encounter (Addendum)
I put the referral to nutrition.    Thanks  [employer]

## 2010-06-28 ENCOUNTER — Encounter (HOSPITAL_BASED_OUTPATIENT_CLINIC_OR_DEPARTMENT_OTHER): Payer: PRIVATE HEALTH INSURANCE | Admitting: Registered"

## 2010-07-01 ENCOUNTER — Ambulatory Visit (HOSPITAL_BASED_OUTPATIENT_CLINIC_OR_DEPARTMENT_OTHER): Payer: PRIVATE HEALTH INSURANCE | Admitting: Physical Medicine & Rehabilitation

## 2010-07-01 DIAGNOSIS — M542 Cervicalgia: Secondary | ICD-10-CM

## 2010-07-01 DIAGNOSIS — M5382 Other specified dorsopathies, cervical region: Secondary | ICD-10-CM

## 2010-07-01 DIAGNOSIS — M4602 Spinal enthesopathy, cervical region: Secondary | ICD-10-CM

## 2010-07-01 MED ORDER — METHYLPREDNISOLONE ACETATE 20 MG/ML IJ SUSP
20.00 mg | INTRAMUSCULAR | Status: AC
Start: 2010-07-01 — End: 2010-07-02

## 2010-07-01 MED ORDER — BUPIVACAINE HCL 0.25 % IJ SOLN
5.00 mL | INTRAMUSCULAR | Status: AC
Start: 2010-07-01 — End: 2010-07-02

## 2010-07-01 NOTE — Progress Notes (Signed)
The primary progress note for this visit has been dictated through E-Scription. It can be viewed as an attachment to this encounter or through Chart Review under the Other Tab as an Orthopedic Office Note.      Review of Systems: Constitutional, Eyes, ENT/Mouth, Cardiovascular, Respiratory, GI, GU, Neuro, Psych, Heme/Lymph, Skin, Musculoskeletal was reviewed and is NEGATIVE except for what is dictated in the note.    MT ACCT #:  [id number]

## 2010-07-03 LAB — ORTHOPEDIC OFFICE NOTE

## 2010-07-05 ENCOUNTER — Encounter (HOSPITAL_BASED_OUTPATIENT_CLINIC_OR_DEPARTMENT_OTHER): Payer: Self-pay | Admitting: Internal Medicine

## 2010-07-07 ENCOUNTER — Telehealth (HOSPITAL_BASED_OUTPATIENT_CLINIC_OR_DEPARTMENT_OTHER): Payer: Self-pay

## 2010-07-07 DIAGNOSIS — E785 Hyperlipidemia, unspecified: Principal | ICD-10-CM

## 2010-07-07 MED ORDER — SIMVASTATIN 20 MG PO TABS
20.0000 mg | ORAL_TABLET | Freq: Every evening | ORAL | Status: DC
Start: 2010-07-07 — End: 2010-10-14

## 2010-07-07 NOTE — Telephone Encounter (Signed)
Tina [name] is a 55 year old female calling to report that she was prescribed  Pravachol 06-20-10.    She reports she had general malaise and nausea.  She has discontinued this  Medication and feeling improved off it.    Requesting to know if PCP wishes alternate med.

## 2010-07-07 NOTE — Telephone Encounter (Addendum)
Call to patient via [facility] portuguese Interpreter : number given rings once then goes dead.  [phone] : spoke with husband: advised of message from provider. Reviewed name, dose, frequency with husband.  Advised to be sure pt calls clinic with any issues /problems tolerating med. Agrees with plan.Kearney Hard, RN

## 2010-07-07 NOTE — Telephone Encounter (Signed)
Message copied by Ledell Peoples on Thu Jul 07, 2010 10:06 AM  ------       Message from: Riccardo Dubin       Created: Thu Jul 07, 2010  9:59 AM       Regarding: Triage       Contact: [phone]         Tina [name] is a 55 year old old female.              Patient's PCP: Carlyn Reichert, MD              In case we get disconnected what is the best number to reach you at today:       Home Phone [phone] (home)              Person calling:       Patient (self)              How can I help you today:        Patient said that she was taking the medication for Cholesterol the doctor prescribed and was not feeling well, was having nauseas, stopped taking it. Wants to know if the doctor can change the medication. Patient does not remember the name of the med.              Patient's language of care: [place]              Would you like an interpreter when the nurse calls you back?       YES  [place]

## 2010-07-07 NOTE — Telephone Encounter (Signed)
I will give a trial of another statin. If symptoms recur will change to gemfibrozil. Prescription sent.

## 2010-07-07 NOTE — Telephone Encounter (Addendum)
Addended by: Jaiana Sheffer M. on: 07/07/2010      Modules accepted: Disposition Section

## 2010-07-13 MED ORDER — METHYLPREDNISOLONE ACETATE 40 MG/ML IJ SUSP
40.00 mg | INTRAMUSCULAR | Status: AC
Start: 2010-07-13 — End: 2010-07-14

## 2010-07-13 MED ORDER — BUPIVACAINE HCL 0.5 % IJ SOLN
5.00 mL | INTRAMUSCULAR | Status: AC
Start: 2010-07-13 — End: 2010-07-14

## 2010-07-13 NOTE — Progress Notes (Addendum)
The primary progress note for this visit has been dictated through E-Scription. It can be viewed as an attachment to this encounter or through Chart Review under the Other Tab as an Orthopedic Office Note.      Review of Systems: Constitutional, Eyes, ENT/Mouth, Cardiovascular, Respiratory, GI, GU, Neuro, Psych, Heme/Lymph, Skin, Musculoskeletal was reviewed and is NEGATIVE except for what is dictated in the note.    MT ACCT #:  [id number]

## 2010-07-13 NOTE — Progress Notes (Addendum)
Addended by: Rasheida Broden on: 07/13/2010      Modules accepted: Orders, SmartSet

## 2010-07-15 ENCOUNTER — Other Ambulatory Visit (HOSPITAL_BASED_OUTPATIENT_CLINIC_OR_DEPARTMENT_OTHER): Payer: Self-pay | Admitting: Internal Medicine

## 2010-07-15 DIAGNOSIS — I10 Essential (primary) hypertension: Principal | ICD-10-CM

## 2010-07-15 LAB — ORTHOPEDIC OFFICE NOTE

## 2010-07-15 NOTE — Telephone Encounter (Signed)
[facility]    Person calling on behalf of patient: Patient (self)    May list multiple medications in this section  Medicine Name: Amlodipine - she has 0 left and her Rx has expired. She needs it today because her BP is high she said.   Dosage:   Frequency (how many pills, how many times a day):   Number of pills left:     Documented patient preferred pharmacies:  [facility] ([place])Phone: [phone] Fax: [phone]  Patient's language of care: [place]    Patient does not need an interpreter.

## 2010-07-15 NOTE — Telephone Encounter (Signed)
Tina [name] is a 55 year old female has requested a refill of Amlodipine.  HTN Med:    Most Recent BP Reading(s)  06/17/10 : 120/70  04/15/10 : 128/74  01/28/10 : 114/70    Documented patient preferred pharmacies:  [facility] ([place])Phone: [phone] Fax: [phone]

## 2010-07-18 MED ORDER — BUPIVACAINE HCL 0.5 % IJ SOLN
5.00 mL | Freq: Once | INTRAMUSCULAR | Status: AC
Start: 2010-07-18 — End: 2010-07-18

## 2010-07-18 MED ORDER — METHYLPREDNISOLONE ACETATE 40 MG/ML IJ SUSP
40.00 mg | Freq: Once | INTRAMUSCULAR | Status: AC
Start: 2010-07-18 — End: 2010-07-18

## 2010-07-18 MED ORDER — AMLODIPINE BESYLATE 5 MG PO TABS
5.0000 mg | ORAL_TABLET | Freq: Every day | ORAL | Status: DC
Start: 2010-07-15 — End: 2011-07-15

## 2010-07-18 NOTE — Progress Notes (Addendum)
Addended by: Garth Diffley on: 07/18/2010      Modules accepted: Orders, SmartSet

## 2010-07-18 NOTE — Telephone Encounter (Signed)
Pt. Calling again for refill of Amlodipine. She said she was given 3 pills at the pharmacy last Friday and she ran out and now she has 0. She wants her husband to pick it up today she said.

## 2010-07-18 NOTE — Telephone Encounter (Signed)
Done  Tina [name]

## 2010-07-18 NOTE — Progress Notes (Addendum)
The primary progress note for this visit has been dictated through E-Scription. It can be viewed as an attachment to this encounter or through Chart Review under the Other Tab as an Orthopedic Office Note.      Review of Systems: Constitutional, Eyes, ENT/Mouth, Cardiovascular, Respiratory, GI, GU, Neuro, Psych, Heme/Lymph, Skin, Musculoskeletal was reviewed and is NEGATIVE except for what is dictated in the note.    MT ACCT #:  [id number]

## 2010-07-18 NOTE — Telephone Encounter (Signed)
All out

## 2010-07-29 ENCOUNTER — Ambulatory Visit (HOSPITAL_BASED_OUTPATIENT_CLINIC_OR_DEPARTMENT_OTHER): Payer: PRIVATE HEALTH INSURANCE | Admitting: Otolaryngology

## 2010-07-29 ENCOUNTER — Encounter (HOSPITAL_BASED_OUTPATIENT_CLINIC_OR_DEPARTMENT_OTHER): Payer: Self-pay | Admitting: Otolaryngology

## 2010-07-29 VITALS — BP 119/71 | HR 92 | Temp 97.1°F

## 2010-07-29 DIAGNOSIS — R51 Headache: Principal | ICD-10-CM

## 2010-07-29 DIAGNOSIS — G8929 Other chronic pain: Secondary | ICD-10-CM

## 2010-07-29 NOTE — Progress Notes (Signed)
HPI: 55 year old female Tina [name] is complaining of headaches in the temple area across the skull and into the posterior neck.  She takes Ibuprofen daily.  She is also having right otalgia without hearing loss.  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 She is a non-smoker.  She has jaw problems and dental issues.      Past medical, social and family history reviewed in EPIC.    Review of Systems -   Fever No  Cough No  Easy Bruising No  Blurred Vision No  Palpitations No  Heartburn No  Stiff Neck No  Headache No  Skin Lesions of the Face No    EXAM  General: WD WN female with a normal voice.  Face: No lesions.  Facial Strength: Normal and symmetric.  Eyes: PERRLA and EOMI.  Ear R: No lesions. Cerumen: none / minimal. Canal clear. TM sclerotic posterior with monomembrane and probable tiny ant/sup perf no erythema or otorrhea   Ear L:  No lesions. Cerumen: none / minimal. Canal clear. TM opaque.  Nose:  Septum midline.  Turbinates normal size.  No polyps or masses.  Nasopharynx:  CNV    Oral Cavity/Oropharynx:  Mucous membranes moist.  Tonsils 0+.  Teeth in fair condition.  Tongue no lesions. +TMJ pain   Hypopharynx: Base of tongue no masses. Pyriform sinuses clear.  Larynx: Epiglottis no edema or lesions. Vocal cords: CNV.  Neck:  Trachea midline.  No palpable masses.  Thyroid: Normal to palpation.  Lymph:  No palpable nodes.  Subman glands:  Normal size, non-tender.  Parotids: Normal size, no masses or nodes.    A/P  55 year old female no sign of acute infection right ear and very unlikely related to chronic years of headaches.  I would like her to return for testing of only hearing right ear.  She is taking 800 mg Ibuprofen some days every 8 hours and also had neck injections with orthopedics.  I highly recommend a visit with neurology for discussion of alternative headache therapy.

## 2010-07-31 ENCOUNTER — Encounter (HOSPITAL_BASED_OUTPATIENT_CLINIC_OR_DEPARTMENT_OTHER): Payer: Self-pay | Admitting: Internal Medicine

## 2010-08-01 ENCOUNTER — Telehealth (HOSPITAL_BASED_OUTPATIENT_CLINIC_OR_DEPARTMENT_OTHER): Payer: Self-pay | Admitting: Otolaryngology

## 2010-08-05 ENCOUNTER — Ambulatory Visit (HOSPITAL_BASED_OUTPATIENT_CLINIC_OR_DEPARTMENT_OTHER): Payer: PRIVATE HEALTH INSURANCE | Admitting: Ophthalmology

## 2010-08-05 ENCOUNTER — Encounter (HOSPITAL_BASED_OUTPATIENT_CLINIC_OR_DEPARTMENT_OTHER): Payer: Self-pay | Admitting: Ophthalmology

## 2010-08-05 DIAGNOSIS — H1010 Acute atopic conjunctivitis, unspecified eye: Secondary | ICD-10-CM

## 2010-08-05 DIAGNOSIS — H524 Presbyopia: Secondary | ICD-10-CM

## 2010-08-05 NOTE — Patient Instructions (Signed)
Tina [name]    Try over the counter Zaditor or Alaway for itchy eyes.

## 2010-08-05 NOTE — Progress Notes (Signed)
Tina [name] was seen in the [facility] for a complaint of decreased near vision.  Pt was examined and refracted and would benefit from glasses.  The patient was given an eye glass prescription which they can get filled at the optical shop of their choice.  Options including over- the- counter readers, single vision readers, bifocals, and progressive add lenses were discussed with patient.    She has mild allergic conjunctivitis symptoms and can try otc Zaditor or Alaway.

## 2010-08-05 NOTE — Nursing Note (Signed)
>>   Tina [name],OT     Fri Aug 05, 2010 11:27 AM  Feels dizzy when uses her OTC readers. Temple HAs for years. Watery, itchy eyes. FH of Glaucoma and Retinal Degeneration (father). F/u HA, Hyperopia, Astigmatism, Presbyopia.

## 2010-08-08 ENCOUNTER — Encounter (HOSPITAL_BASED_OUTPATIENT_CLINIC_OR_DEPARTMENT_OTHER): Payer: Self-pay | Admitting: Internal Medicine

## 2010-08-08 ENCOUNTER — Ambulatory Visit (HOSPITAL_BASED_OUTPATIENT_CLINIC_OR_DEPARTMENT_OTHER): Payer: PRIVATE HEALTH INSURANCE | Admitting: Internal Medicine

## 2010-08-08 ENCOUNTER — Encounter (HOSPITAL_BASED_OUTPATIENT_CLINIC_OR_DEPARTMENT_OTHER): Payer: Self-pay

## 2010-08-08 VITALS — BP 114/70 | HR 105 | Temp 97.9°F | Wt 147.0 lb

## 2010-08-08 DIAGNOSIS — R51 Headache: Principal | ICD-10-CM

## 2010-08-08 DIAGNOSIS — Z0000 Encounter for general adult medical examination without abnormal findings: Secondary | ICD-10-CM | POA: Insufficient documentation

## 2010-08-08 NOTE — Progress Notes (Signed)
SUBJECTIVE:    Tina [name] is a 55 y.o who presents to clinic with c/c of chronic headaches  Reports having current a constant headache for the past 3 weeks. Starts from the back of her neck and radiates to her forehead.+ intermittent nausea and vomiting. Denies photophobia, dizziness, lightheadedness, head trauma, change in speech, gait, numbness and tingling.  Reports having similar headaches in the past. Had a CT scan done 15 years ago in [place]. States it was normal.  Requests to have a sooner appt with Neurology, is scheduled in May.   Reports being seen by ENT, was told she did not have an infection + perforation of her right TM  Has seen Opthalmology, was told her vision did not increase in headaches.   Seen by Orthopedics for neck pain, did not help the headaches  Has taken Motrin 800 mg  prn with some effect. Will take Motrin at night, sleeps well, then walks up with the headaches.     Denies fevers, chills, neck stiffness, chest pain, sob, palpitations, abdominal pain, change in bowel and urinary patterns.     OBJECTIVE:  General: A+0x3,NAD  BP 114/70  Pulse 105  Temp(Src) 97.9 F (36.6 C) (Oral)  Wt 147 lb (66.679 kg)  SpO2 98%  HEENT: NT/AT PERRLA, smile is symmetrical, tongue midline, no facial droop  Neuro: CN II-XII  intact, steady gait, normal reflexes      ASSESSMENT/PLAN  784.0 Headache  (primary encounter diagnosis)  Comment: chronic headaches.   Consulted with my supervising PCP, Dr. Hubbard Robinson.   Will order a non contrast Head CT.   To front end to see if any sooner appts,   Continue with Motrin prn  Advised pt to report continuing or worsening symptoms. Pt agreed to plan.  Plan: ORDER FOR CT SCAN OR CT ANGIOGRAPHY    I have spent 25 minutes in face to face time with this patient/patient proxy of which > 50% was in counseling or coordination of care regarding above issues/Dx.

## 2010-08-08 NOTE — Progress Notes (Signed)
Dear doctor, we scheduled your patient for a screening colonoscopy, after many calls and a letter, but pt was a no show for procedure. We will keep paper work on file, and attempt again to contact. Please instruct as well next visit in your office.Thank you  Domanique Luckett RN

## 2010-08-09 ENCOUNTER — Ambulatory Visit (HOSPITAL_BASED_OUTPATIENT_CLINIC_OR_DEPARTMENT_OTHER): Payer: PRIVATE HEALTH INSURANCE

## 2010-08-09 ENCOUNTER — Encounter (HOSPITAL_BASED_OUTPATIENT_CLINIC_OR_DEPARTMENT_OTHER): Payer: PRIVATE HEALTH INSURANCE | Admitting: Registered"

## 2010-08-11 ENCOUNTER — Other Ambulatory Visit: Payer: Self-pay | Admitting: Internal Medicine

## 2010-08-11 LAB — CT HEAD WO CONTRAST

## 2010-08-15 ENCOUNTER — Telehealth (HOSPITAL_BASED_OUTPATIENT_CLINIC_OR_DEPARTMENT_OTHER): Payer: Self-pay | Admitting: Internal Medicine

## 2010-08-15 NOTE — Telephone Encounter (Signed)
Call made to pt. Discussed normal head CT scan.  Continues to have headaches. Was bad yesterday, + nausea, denies vomiting.  Notified pt her PCP is currently trying to get in contact with neurology for a sooner appt, is scheduled in May. Takes Motrin prn with some effect. Pt will come to clinic 08/19/2010 with see PCP for f/u headaches    FYI to PCP

## 2010-08-19 ENCOUNTER — Ambulatory Visit (HOSPITAL_BASED_OUTPATIENT_CLINIC_OR_DEPARTMENT_OTHER): Payer: PRIVATE HEALTH INSURANCE | Admitting: Internal Medicine

## 2010-08-19 VITALS — BP 118/70 | HR 85 | Wt 146.0 lb

## 2010-08-19 DIAGNOSIS — M542 Cervicalgia: Secondary | ICD-10-CM

## 2010-08-19 DIAGNOSIS — G44209 Tension-type headache, unspecified, not intractable: Secondary | ICD-10-CM

## 2010-08-19 MED ORDER — CYCLOBENZAPRINE HCL 10 MG PO TABS
10.00 mg | ORAL_TABLET | Freq: Three times a day (TID) | ORAL | Status: AC | PRN
Start: 2010-08-19 — End: 2010-09-02

## 2010-08-19 NOTE — Progress Notes (Signed)
Tina [name] is a 55 year old female who presents with the following problems:    Headache and neck pain:  Pain has become worse over the last 2 months. She has visited the clinic multiple times for this. The pain is mostly on the back of there neck radiating to the entire head. She has had relief with ibuprofen in the past but currently she mentions that the relief is only mild. She mentions that the pain is present all the time and that she wakes up in the morning with the pain some times 10/10, throbbing, decreasing neck motion.    ROS: Denies dizziness, changes in vision, no other neuro complaints no other muscle aches.    Social History Narrative    Immigrated from [place] in 2003    Review of Patient's Allergies indicates:   Pravachol               Nausea Only, Other (See Comments)    Comment:Malaise        Current outpatient prescriptions ordered prior to encounter:  amlodipine (NORVASC) 5 MG tablet Take 1 tablet by mouth daily. Disp: 30 tablet Rfl: 11   simvastatin (ZOCOR) 20 MG tablet Take 1 tablet by mouth nightly. Disp: 30 tablet Rfl: 2   ibuprofen (ADVIL,MOTRIN) 800 MG tablet Take 1 tablet by mouth every 6 (six) hours as needed for Pain. Disp: 30 tablet Rfl: 3   fluoxetine (PROZAC) 20 MG capsule Take 1 capsule by mouth every morning. Disp: 30 capsule Rfl: 5         OBJECTIVE:  BP 118/70  Pulse 85  Wt 146 lb (66.225 kg)  SpO2 96%  General: seated comfortable in NAD, alert, pleasant and cooperative  PERLA, pt denies any pain over the head itself. No pain to palpation of the temporal area nor the sinuses.  Neuro: alert & oriented x 3, no dysarthria or aphasia, gait wnl  Pain is reproducible to palpation of the back of the neck at the region of the scalene muscles and trapezius. Pt has decreased neck flexion to the right which she says is at baseline.      A/P:  307.81 Tension headache  (primary encounter diagnosis)  Comment: The pain seems to be musculoskeletal transferred from the back of  her neck. She has taken tylenol with mild relief, we will do a short trial of a muscle relaxant. Pt has f/u with neuro in May.  Plan: cyclobenzaprine (FLEXERIL) 10 MG tablet    723.1BN Neck pain, acute  Comment: Pain seems mostly musculoskeletal in nature as it is reproducible to palpation of several neck muscles. Significant stiffness noted, she may therefore benefit from short PT. She will f/u with me in May.  Plan: REFERRAL TO PHYSICAL THERAPY ( INT)

## 2010-09-16 ENCOUNTER — Ambulatory Visit (HOSPITAL_BASED_OUTPATIENT_CLINIC_OR_DEPARTMENT_OTHER): Payer: PRIVATE HEALTH INSURANCE | Admitting: Internal Medicine

## 2010-09-21 ENCOUNTER — Ambulatory Visit (HOSPITAL_BASED_OUTPATIENT_CLINIC_OR_DEPARTMENT_OTHER): Payer: PRIVATE HEALTH INSURANCE

## 2010-09-22 ENCOUNTER — Telehealth (HOSPITAL_BASED_OUTPATIENT_CLINIC_OR_DEPARTMENT_OTHER): Payer: Self-pay

## 2010-09-22 NOTE — Telephone Encounter (Signed)
Call to patient via [employer]: she reports she did not take BP med last night.  Soon after she experienced head pressure and racing heart.  Did not feel irregular-racing only.    No chest pain, SOB, dyspnea, visual changes, speech or mobility issues, sweating, nausea, vomiting.  Took med.  Felt better after one hour.  Currently resolved.  Patient concerned and requests to know if this can happen.  Declined appt offered today.  Advised if symptoms should occur again to call clinic or go to ER.  Reviewed emergent signs/symptoms with patient for which she should call 911.  Advised will send to PCP to advise if any further action required. Kearney Hard, RN

## 2010-09-22 NOTE — Telephone Encounter (Signed)
Call to patient - advised of message from PCP. Agrees with plan. Urgent appt for eval scheduled tomorrow.

## 2010-09-22 NOTE — Telephone Encounter (Signed)
In general not taking her blood pressure medication would make her blood pressure go up but would not cause any symptoms. Her headache may have been from high blood pressure, I don't know why her heart was racing. She should keep her appointment with me to recheck her blood pressure. If she experiences chest pain, palpitations or SOB. She should call or go to the ED.    Tina [name]

## 2010-09-22 NOTE — Telephone Encounter (Signed)
Message copied by Ledell Peoples on Thu Sep 22, 2010  9:55 AM  ------       Message from: Riccardo Dubin       Created: Thu Sep 22, 2010  9:48 AM       Regarding: Triage       Contact: [phone]         Tina [name] is a 55 year old old female.              Patient's PCP: Carlyn Reichert, MD              In case we get disconnected what is the best number to reach you at today:       Cel: [phone]              Person calling:       Patient (self)              How can I help you today:        Patient said that yesterday about 5:00 pm she was feeling pressure on her head and her stomach was upset. This morning, had palpitations and headache, does not know if this was because she forgot to take her blood pressure medication yesterday. Said that took the medication this morning. Now she is feeling ok and is working. Is requesting the nurse to call her              Patient's language of care: [place]              Would you like an interpreter when the nurse calls you back?       YES  [place]

## 2010-09-23 ENCOUNTER — Encounter (HOSPITAL_BASED_OUTPATIENT_CLINIC_OR_DEPARTMENT_OTHER): Payer: Self-pay | Admitting: Family

## 2010-09-23 ENCOUNTER — Encounter (HOSPITAL_BASED_OUTPATIENT_CLINIC_OR_DEPARTMENT_OTHER): Payer: Self-pay | Admitting: Internal Medicine

## 2010-09-23 ENCOUNTER — Ambulatory Visit (HOSPITAL_BASED_OUTPATIENT_CLINIC_OR_DEPARTMENT_OTHER): Payer: PRIVATE HEALTH INSURANCE | Admitting: Family

## 2010-09-23 VITALS — BP 140/90 | HR 72 | Temp 97.7°F | Wt 146.0 lb

## 2010-09-23 DIAGNOSIS — I10 Essential (primary) hypertension: Secondary | ICD-10-CM

## 2010-09-23 DIAGNOSIS — R002 Palpitations: Principal | ICD-10-CM

## 2010-09-23 LAB — CHG ELECTROLYTE PANEL
ANION GAP: 9 mmol/L (ref 2–25)
CARBON DIOXIDE: 29 mmol/L (ref 22–32)
CHLORIDE: 102 mmol/L (ref 101–111)
POTASSIUM: 4.4 mmol/L (ref 3.5–5.1)
SODIUM: 140 mmol/L (ref 135–144)

## 2010-09-23 LAB — TSH (THYROID STIMULATING HORMONE): TSH (THYROID STIM HORMONE): 1.52 u[IU]/mL (ref 0.34–5.60)

## 2010-09-23 NOTE — Progress Notes (Signed)
S/O: 55 year old woman here for c/o elevated B/P, palpitations. Meds reviewed and pt. Reports stopping her Prozac about 2 months ago because she can "sleep" now. She states sx started last Wed about 5 PM when palpitations started - she reports no dizziness and it lasted about 1-2 seconds. This happened again yesterday it was not as strong and similar to previous day. Also this AM again much lighter and brief. She drinks coffee - once daily. She denies use of OTC pills/supplements. Her presentation today is anxious.     Review of Systems   Constitutional: Negative.    Cardiovascular: Positive for palpitations.   All other systems reviewed and are negative.      Physical Exam   Constitutional: She is oriented to person, place, and time. She appears well-developed and well-nourished.   HENT:   Head: Normocephalic and atraumatic.   Right Ear: External ear normal.   Left Ear: External ear normal.   Nose: Nose normal.   Mouth/Throat: Oropharynx is clear and moist.   Eyes: Conjunctivae are normal.   Neck: Normal range of motion. Neck supple. No thyromegaly present.   Cardiovascular: Normal rate, regular rhythm and normal heart sounds.    Pulmonary/Chest: Effort normal.   Musculoskeletal: Normal range of motion.   Lymphadenopathy:     She has no cervical adenopathy.   Neurological: She is alert and oriented to person, place, and time. She exhibits normal muscle tone. Coordination normal.   Skin: Skin is warm and dry.   Psychiatric: She has a normal mood and affect. Her behavior is normal. Judgment and thought content normal.       ASSESSMENT and CARE:    785.1 Palpitations  (primary encounter diagnosis)  Comment: Instructed to avoid coffee/alcohol/caffeine drinks. Instructed not to use any OTC supplements and to hydrate herself with fluids. EKG is normal.  Plan: THYROID STIM HORMONE, ROUTINE VENIPUNCTURE,         EKG, ELECTROLYTE PANEL            401.9AH Hypertension  Comment: The pt. Reports medication compliance. She was  instructed in low salt diet and follow up with PCP in abut 3 weeks. She does have anxiety and she has stopped use of Prozac. I have asked her to discuss with her PCP.  Plan: Recheck on follow up.      Vitals 09/23/2010 08/19/2010   SYSTOLIC 140 118   DIASTOLIC 90 70   PULSE 72 85   TEMPERATURE 97.7    RESPIRATIONS     WEIGHT 146 146   WEIGHT (kg) 66.225 kg 66.225 kg   HEIGHT     O2Sat 98 96   BODY MASS INDEX 26.70 26.70   Pain Score 0 6   Last LMP         The patient has been instructed on this plan of care and agrees with it. I have also discussed worrisome S&SX and if needed to call here to talk with a nurse ,or, use the emergency room.

## 2010-09-27 LAB — EKG

## 2010-10-14 ENCOUNTER — Ambulatory Visit (HOSPITAL_BASED_OUTPATIENT_CLINIC_OR_DEPARTMENT_OTHER): Payer: PRIVATE HEALTH INSURANCE | Admitting: Internal Medicine

## 2010-10-14 VITALS — BP 142/80 | HR 95 | Temp 99.6°F | Wt 146.0 lb

## 2010-10-14 DIAGNOSIS — E785 Hyperlipidemia, unspecified: Secondary | ICD-10-CM

## 2010-10-14 DIAGNOSIS — I10 Essential (primary) hypertension: Secondary | ICD-10-CM

## 2010-10-14 DIAGNOSIS — R079 Chest pain, unspecified: Secondary | ICD-10-CM

## 2010-10-14 LAB — CHG LIPOPROTEIN DIRECT MEASUREMENT LDL CHOLESTEROL: LOW DENSITY LIPOPROTEIN DIRECT: 82 mg/dl (ref 0–100)

## 2010-10-14 LAB — CHG LIPOPROTEIN DIR MEAS HIGH DENSITY CHOLESTEROL: HIGH DENSITY LIPOPROTEIN: 49 mg/dl (ref 35–85)

## 2010-10-14 LAB — CHOLESTEROL: Cholesterol: 145 mg/dl (ref 0–200)

## 2010-10-14 MED ORDER — SIMVASTATIN 20 MG PO TABS
20.0000 mg | ORAL_TABLET | Freq: Every evening | ORAL | Status: DC
Start: 2010-10-14 — End: 2011-02-13

## 2010-10-14 NOTE — Progress Notes (Signed)
Subjective:   Tina [name] is a female with hypertension who comes for f/u    HTN:  Please see problem list for details of HPI. Pt has been checking her BP at home and has noticed that it has been elevated. She has been compliant with the medication but has been exercising less and having a little bit more stress.    Hypertension ROS: taking medications as instructed,no medication side effects noted,no TIA's, no dyspnea on exertion,no swelling of ankles. Pt mentions that she had one episode of chest pain last week, on the left side as a small pressure, no radiation, no nausea or vomiting, this was at rest and improved on its own.  She also has had a few episodes of palpitations w/o chest pain, lasting a few seconds, not bothersome, not associated with SOB. She was seen on 09/23/10 for this her EKG was normal. TSH was also normal.    Most Recent BP Reading(s)  10/14/10 : 142/80  09/23/10 : 140/90  08/19/10 : 118/70  08/08/10 : 114/70  07/29/10 : 119/71    F/u Hyperlipidemia:  Ms Kallista Pae was found to have very elevated LDL. She has been taking simvastatin and is compliant wit the medication. She denies any side effect associated with the medication, admits to having weight gain despite trying to do a low fat diet. She is sedentary.  BLOOD UREA NITROGEN (mg/dl)   Date     Date  Value    03/19/2009  14     05/30/2006  10     11/23/2004  8    CREATININE (mg/dl)   Date     Date  Value    03/19/2009  0.6     05/30/2006  0.8     11/23/2004  0.7    POTASSIUM (mmol/L)   Date     Date  Value    09/23/2010  4.4     03/19/2009  3.5     05/30/2006  4.3    SODIUM (mmol/L)   Date     Date  Value    09/23/2010  140     03/19/2009  142     05/30/2006  138      Social History Narrative    Immigrated from [place] in 2003      Current outpatient prescriptions ordered prior to encounter:  amlodipine (NORVASC) 5 MG tablet Take 1 tablet by mouth daily. Disp: 30 tablet Rfl: 11   simvastatin (ZOCOR) 20 MG tablet Take 1 tablet by mouth  nightly. Disp: 30 tablet Rfl: 2         Objective:  BP 142/80  Pulse 95  Temp(Src) 99.6 F (37.6 C) (Oral)  Wt 146 lb (66.225 kg)  SpO2 95%  Repeat BP check after 1hr is 128/70  Appearance healthy, alert, smiling and healthy,alert,cooperative.  Neck is supple w/o any JVD.  Chest is clear, no wheezing or rales. Normal symmetric air entry throughout both lung fields. No chest wall deformities or tenderness.  Heart: heart sounds are regular, w/o any murmurs, gallops or rubs.  Extremities: extremities, peripheral pulses and reflexes normal, no edema, redness or tenderness in the calves or thighs.    Assessment:    Hypertension well controlled,no significant medication side effects noted.   Plan: current treatment plan is effective, no change in therapy.    Chest pain:  Atypical, only once in a pt with HTN and hyperlipidemia. Will schedule for an exercise stress test.    Hyperlipidemia:  Pt  has been having trouble with dietary modifications, she is compliant with simvastatin, will recheck her cholesterol today and have her f/u with nutrition.

## 2010-10-25 ENCOUNTER — Encounter (HOSPITAL_BASED_OUTPATIENT_CLINIC_OR_DEPARTMENT_OTHER): Payer: Self-pay | Admitting: Internal Medicine

## 2010-10-25 ENCOUNTER — Ambulatory Visit (HOSPITAL_BASED_OUTPATIENT_CLINIC_OR_DEPARTMENT_OTHER): Payer: PRIVATE HEALTH INSURANCE | Admitting: Internal Medicine

## 2010-10-25 ENCOUNTER — Telehealth (HOSPITAL_BASED_OUTPATIENT_CLINIC_OR_DEPARTMENT_OTHER): Payer: Self-pay | Admitting: Internal Medicine

## 2010-10-25 VITALS — BP 144/80 | HR 78 | Temp 97.4°F | Wt 147.0 lb

## 2010-10-25 DIAGNOSIS — R002 Palpitations: Principal | ICD-10-CM

## 2010-10-25 NOTE — Telephone Encounter (Signed)
Pt is at work . Feeling very fatigued at present and feels her heart races at times .   Denies chest pain , sob .   This has happened before . In no acute distress at present , but wants to be seen today .   Appoint made with Dr Russella Dar this afternoon .   If symptoms exacerbate please go to ED , pt agrees .

## 2010-10-25 NOTE — Telephone Encounter (Signed)
[facility]    Tina [name] [id number], 55 year old, female,   Telephone Information:   Home Phone [phone]   Work Phone Not on file.   Mobile [phone]       CALL BACK NUMBER: [phone]        Patient's language of care: [place]    Patient needs a [place] interpreter.    Patient's PCP: Carlyn Reichert, MD    Person calling on behalf of patient: Patient (self)    Calls today with a sick call. Very fatigue with tachycardia, was seen in the past for same reason.    Patient's Preferred Pharmacy:   [facility] ([facility])Phone: [phone] Fax: [phone]

## 2010-10-25 NOTE — Progress Notes (Signed)
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  Denies drinking caffeine, use of supplements/diet pills    PHYSICAL EXAM:  BP 144/80  Pulse 78  Temp 97.4 F (36.3 C)  Wt 147 lb (66.679 kg)  SpO2 98%  Pain Score: 0 (0/10)  Gen: wdwn, pleasant, nad  HEENT: neck supple, no thyromegaly or thyroid nodules  Cor: RRR S1S2 no m/g/r  Pulm: CTA bilat  Ext; no resting tremor    ECG 09/23/10  Vent. Rate : 069 BPM Atrial Rate : 069 BPM  P-R Int : 130 ms QRS Dur : 086 ms  QT Int : 412 ms P-R-T Axes : 064 063 051 degrees  QTc Int : 441 ms    Normal sinus rhythm  Normal ECG      THYROID STIM HORMONE (uIU/mL)   Date     Date  Value    09/23/2010  1.52    ----------      ASSESSMENT/PLAN:    785.1 Palpitations  (primary encounter diagnosis)  Pt with several recent visits with c/o palpitations; EKG and TSH nl  Pt with clear story of rapid onset and offset of palps  Denies any anxiety sx  Will refer for holter monitoring given persistence of c/o and neg testing thus far  Pt agrees with plan  Plan: REFERRAL TO CARDIO-PULMONARY LAB ( INT)

## 2010-11-03 ENCOUNTER — Ambulatory Visit (HOSPITAL_BASED_OUTPATIENT_CLINIC_OR_DEPARTMENT_OTHER): Payer: Self-pay | Admitting: Internal Medicine

## 2010-11-03 DIAGNOSIS — R079 Chest pain, unspecified: Secondary | ICD-10-CM

## 2010-11-04 ENCOUNTER — Ambulatory Visit (HOSPITAL_BASED_OUTPATIENT_CLINIC_OR_DEPARTMENT_OTHER): Payer: Self-pay | Admitting: Internal Medicine

## 2010-11-04 DIAGNOSIS — R079 Chest pain, unspecified: Principal | ICD-10-CM

## 2010-11-06 LAB — EKG

## 2010-11-07 LAB — CARDIAC STRESS TEST: EXERCISE/TREADMILL

## 2010-11-10 ENCOUNTER — Encounter (HOSPITAL_BASED_OUTPATIENT_CLINIC_OR_DEPARTMENT_OTHER): Payer: Self-pay | Admitting: Internal Medicine

## 2010-11-10 DIAGNOSIS — I491 Atrial premature depolarization: Secondary | ICD-10-CM

## 2010-11-11 ENCOUNTER — Ambulatory Visit (HOSPITAL_BASED_OUTPATIENT_CLINIC_OR_DEPARTMENT_OTHER): Payer: PRIVATE HEALTH INSURANCE

## 2010-11-11 LAB — HOLTER MONITORING

## 2010-12-16 ENCOUNTER — Ambulatory Visit (HOSPITAL_BASED_OUTPATIENT_CLINIC_OR_DEPARTMENT_OTHER): Payer: Self-pay | Admitting: Internal Medicine

## 2010-12-16 LAB — MA SCREENING MAMMO BILATERAL WITH CAD

## 2010-12-21 ENCOUNTER — Encounter (HOSPITAL_BASED_OUTPATIENT_CLINIC_OR_DEPARTMENT_OTHER): Payer: Self-pay

## 2011-01-04 ENCOUNTER — Encounter (HOSPITAL_BASED_OUTPATIENT_CLINIC_OR_DEPARTMENT_OTHER): Payer: Self-pay

## 2011-01-04 NOTE — Progress Notes (Signed)
Patient is on dph complex list-breast cysts/hyperlipidemia/htn  Last mammo normal 8/12  ldl 82/chol 145/hdl 49  bp 144/80  appt was planned for end of august with dr Criss Rosales and cancelled-patient is overdue for PE  Will also be due for FOBT in 10/12  To pn to assist in rescheduling with dr demosthene   Removed dph lists. Defer future follow up to planned care team. fyi to provider.

## 2011-01-13 ENCOUNTER — Ambulatory Visit (HOSPITAL_BASED_OUTPATIENT_CLINIC_OR_DEPARTMENT_OTHER): Payer: PRIVATE HEALTH INSURANCE | Admitting: Internal Medicine

## 2011-02-13 ENCOUNTER — Other Ambulatory Visit (HOSPITAL_BASED_OUTPATIENT_CLINIC_OR_DEPARTMENT_OTHER): Payer: Self-pay | Admitting: Internal Medicine

## 2011-02-13 DIAGNOSIS — E785 Hyperlipidemia, unspecified: Principal | ICD-10-CM

## 2011-02-13 MED ORDER — SIMVASTATIN 20 MG PO TABS
20.0000 mg | ORAL_TABLET | Freq: Every evening | ORAL | Status: DC
Start: 2011-02-13 — End: 2012-02-12

## 2011-02-13 NOTE — Telephone Encounter (Signed)
[facility]    Person calling on behalf of patient: Patient (self)    May list multiple medications in this section  Medicine Name: simvastatin   Dosage:   Frequency (how many pills, how many times a day):   Number of pills left:     Documented patient preferred pharmacies:  [facility] ([facility])Phone: [phone] Fax: [phone]    Patient's language of care: [place]    Patient needs a [place] interpreter.

## 2011-02-13 NOTE — Telephone Encounter (Signed)
Person calling on behalf of patient: Patient (self)    Tina [name] is a 55 year old female       - medication(s) request: Simvastatin 20 mg      - last office visit: 10/2010  - last physical exam: 05/18/2009      Statin Med:  Lipids   Cholesterol (mg/dl)   Date     Date  Value    10/14/2010  145    ----------    LDL (mg/dl)   Date     Date  Value    10/14/2010  82    ----------    HDL (mg/dl)   Date     Date  Value    10/14/2010  49    ----------    TRIGLYCERIDE (mg/dl)   Date     Date  Value    05/03/2007  48    ----------LFTs     ALANINE AMINOTRANSFERASE (IU/L)   Date     Date  Value    05/30/2006  19    ----------      ASPARTATE AMINOTRANSFERAS (IU/L)   Date     Date  Value    05/30/2006  18    ----------      ALBUMIN (g/dl)   Date     Date  Value    05/30/2006  3.8    ----------      TOTAL PROTEIN (g/dl)   Date     Date  Value    05/30/2006  7.0    ----------      No results found for this basename: DBILI      BILIRUBIN TOTAL (mg/dl)   Date     Date  Value    05/30/2006  0.5    ----------      ALKALINE PHOSPHATASE (IU/L)   Date     Date  Value    05/30/2006  68    ----------        Documented patient preferred pharmacies:  [facility] ([facility])Phone: [phone] Fax: [phone]

## 2011-02-28 ENCOUNTER — Telehealth (HOSPITAL_BASED_OUTPATIENT_CLINIC_OR_DEPARTMENT_OTHER): Payer: Self-pay

## 2011-02-28 NOTE — Telephone Encounter (Signed)
I called to navigate pt that is due for cpe, pap and colonoscopy. Pt has an apt booked with Dr Criss Rosales on 03/29/11. Pt will discuss with PCP the colonoscopy. I suggested Doreene Eland as our Colonsocpy Pt Navigator to give education about it.

## 2011-03-15 ENCOUNTER — Other Ambulatory Visit (HOSPITAL_BASED_OUTPATIENT_CLINIC_OR_DEPARTMENT_OTHER): Payer: Self-pay | Admitting: Internal Medicine

## 2011-03-15 NOTE — Telephone Encounter (Signed)
PER Pharmacy, Tina [name] is a 55 year old female has requested a refill of Ibuprofen and Fluoxetine.    Last Office Visit: 10/25/10   Last physical : 05/28/09    Other Med Adult:  Most Recent BP Reading(s)  10/25/10 : 144/80        Cholesterol (mg/dl)   Date     Date  Value    10/14/2010  145    ----------    LDL (mg/dl)   Date     Date  Value    10/14/2010  82    ----------    HDL (mg/dl)   Date     Date  Value    10/14/2010  49    ----------    TRIGLYCERIDE (mg/dl)   Date     Date  Value    05/03/2007  48    ----------        THYROID SCREEN TSH (uIU/ml)   Date     Date  Value    11/23/2004  1.29    ----------        THYROID STIM HORMONE (uIU/mL)   Date     Date  Value    09/23/2010  1.52    ----------      No results found for this basename: hgba1c        No results found for this basename: INR       Documented patient preferred pharmacies:  [facility] ([facility])Phone: [phone] Fax: [phone]

## 2011-03-15 NOTE — Telephone Encounter (Signed)
Pt has not been on prozac in a very long time. I have not evaluated her for this. An appointment is needed to see if this is helpful.  Also please advise to take ibuprofen with food if she has pain. She also did not specify where is her pain.    Thanks  [employer]

## 2011-03-22 ENCOUNTER — Ambulatory Visit (HOSPITAL_BASED_OUTPATIENT_CLINIC_OR_DEPARTMENT_OTHER): Payer: PRIVATE HEALTH INSURANCE

## 2011-03-22 DIAGNOSIS — Z23 Encounter for immunization: Principal | ICD-10-CM

## 2011-03-22 NOTE — Progress Notes (Signed)
Influenza Vaccine Procedure  March 22, 2011    1. Has the patient received the information for the influenza vaccine? Yes    2. Does the patient have any of the following contraindications?  Allergy to eggs? No  Allergic reaction to previous influenza vaccines? No  Any other problems to previous influenza vaccines? No  Paralyzed by Guillain-Barre syndrome?  No  Current moderate or severe illness? No  Allergy to contact lens solution? No    3. The vaccine has been administered in the usual fashion.     Immunization information reviewed. Current VIS reviewed and given to patient/ guardian. Verbal assent obtained from patient/ guardian.  See immunization/Injection module or chart review for date of publication and additional information. Verbal assent obtained from patient/guardian. Comfort measures for possible side effects reviewed.

## 2011-03-29 ENCOUNTER — Ambulatory Visit (HOSPITAL_BASED_OUTPATIENT_CLINIC_OR_DEPARTMENT_OTHER): Payer: PRIVATE HEALTH INSURANCE | Admitting: Internal Medicine

## 2011-03-29 ENCOUNTER — Encounter (HOSPITAL_BASED_OUTPATIENT_CLINIC_OR_DEPARTMENT_OTHER): Payer: Self-pay | Admitting: Internal Medicine

## 2011-03-29 VITALS — BP 112/70 | HR 83 | Temp 97.9°F | Wt 148.0 lb

## 2011-03-29 DIAGNOSIS — M436 Torticollis: Secondary | ICD-10-CM

## 2011-03-29 MED ORDER — RANITIDINE HCL 150 MG PO CAPS
150.00 mg | ORAL_CAPSULE | Freq: Two times a day (BID) | ORAL | Status: AC
Start: 2011-03-29 — End: 2011-04-28

## 2011-03-29 MED ORDER — CYCLOBENZAPRINE HCL 10 MG PO TABS
10.00 mg | ORAL_TABLET | Freq: Three times a day (TID) | ORAL | Status: AC | PRN
Start: 2011-03-29 — End: 2011-04-28

## 2011-03-29 MED ORDER — NAPROXEN 500 MG PO TABS
500.00 mg | ORAL_TABLET | Freq: Two times a day (BID) | ORAL | Status: AC
Start: 2011-03-29 — End: 2011-04-28

## 2011-03-29 NOTE — Progress Notes (Signed)
Tina [name] is a 55 year old female who presents with the following problems:  Lot of pain in neck and back. Started last week noted, Thursday. No intiating trauma. Notes that pain immobility increased gradually. Notes that she cannot turn her neck now.   Notes that she took ibuprofen 800mg  does help the pain. It does bother the stomach. Taking pretty much TID. Skipped only 2 days secondary to stomach pain.   Notes hx of difficulty with muscle pain and neck pain. 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 Hx spinal injection years ago for something similar.     Pain in left leg 1 week ago, now resolved.     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      ROS:  Constitutional: no fevers  Eyes: no visual disturbance  HEENT: denies HA, swallowing difficulties  Cor: no chest pains or palps  Pulm: no SOB, cough  MSK: as above  Neuro: no focal changes  Skin: no rashes, lesions  All other systems were reviewed and negative      Past medical history/Past surgical History  Noted in Epic    Social History Narrative  Noted in Epic    Family History Narrative  Noted in Epic      Review of Patient's Allergies indicates:   Pravachol               Nausea Only, Other (See Comments)    Comment:Malaise        Current outpatient prescriptions ordered prior to encounter:  ibuprofen (ADVIL,MOTRIN) 800 MG tablet TAKE ONE TABLET BY MOUTH EVERY SIX HOURS AS NEEDED FOR PAIN. Disp: 30 tablet Rfl: 0   simvastatin (ZOCOR) 20 MG tablet Take 1 tablet by mouth nightly. Disp: 30 tablet Rfl: 11   amlodipine (NORVASC) 5 MG tablet Take 1 tablet by mouth daily. Disp: 30 tablet Rfl: 11         OBJECTIVE:  BP 112/70  Pulse 83  Temp(Src) 97.9 F (36.6 C) (Oral)  Wt 148 lb (67.132 kg)  SpO2 97%  General: seated uncomfortable, alert, pleasant and cooperative  Eyes: Sclera anicteric.  HEENT: Mucous membranes moist. Neck supple. No cervical lymphadenopathy. OP  nonerythematous without lesions.   Cardiac: Heart regular rate rhythm. Normal S1 S2. Without murmur gallop or rub. No clubbing cyanosis or edema of extremities. DP and PT pulses +2.   Pulm: Patient speaking in full sentences. Lungs clear to auscultation.   Ext: Lower extremities without lesions. No erythema or deformity noted. Muscle tension noted in left trapezius muscle and into left upper back.         A/P:  723.5W Torticollis  (primary encounter diagnosis)  Comment: Patient without optimal response to NSAIDs, will try somewhat higher potency with H2 blocker for stomach protection. Apply heat to area 20 minutes BID with stretches. Given recurrent episode ref to PT. Flexeril for acute relief. Warned of drowsiness as side effect. Will not take when driving or caring for small children.  Plan: naproxen (NAPROSYN) 500 MG tablet,         cyclobenzaprine (FLEXERIL) 10 MG tablet,         REFERRAL TO PHYSICAL THERAPY ( INT)            Return to Clinic:  In 1-2 months for follow up with PMD, or sooner if problems arise.

## 2011-03-30 ENCOUNTER — Encounter (HOSPITAL_BASED_OUTPATIENT_CLINIC_OR_DEPARTMENT_OTHER): Payer: Self-pay | Admitting: Internal Medicine

## 2011-04-19 ENCOUNTER — Encounter (HOSPITAL_BASED_OUTPATIENT_CLINIC_OR_DEPARTMENT_OTHER): Payer: Self-pay

## 2011-04-24 ENCOUNTER — Ambulatory Visit (HOSPITAL_BASED_OUTPATIENT_CLINIC_OR_DEPARTMENT_OTHER): Payer: PRIVATE HEALTH INSURANCE | Admitting: Internal Medicine

## 2011-06-20 ENCOUNTER — Telehealth (HOSPITAL_BASED_OUTPATIENT_CLINIC_OR_DEPARTMENT_OTHER): Payer: Self-pay | Admitting: Internal Medicine

## 2011-06-20 NOTE — Patient Instructions (Addendum)
Outreaching to patient fecal occult cards mailed to patient.   [place] interpreter [phone] Trula Ore  left message on cell# for patient to call clinic and reschedule appt [facility] on 04/26/11 with Dr Criss Rosales.  Also fecal occult cards are coming in the mail.

## 2011-06-20 NOTE — Telephone Encounter (Addendum)
[place] interpreter [phone] Trula Ore  left message on cell# for patient to call clinic and reschedule appt [facility] on 04/26/11 with Dr Criss Rosales.  Also fecal occult cards are coming in the mail.

## 2011-06-20 NOTE — Telephone Encounter (Signed)
Outreaching to patient fecal occult cards mailed to patient.

## 2011-07-15 ENCOUNTER — Other Ambulatory Visit (HOSPITAL_BASED_OUTPATIENT_CLINIC_OR_DEPARTMENT_OTHER): Payer: Self-pay | Admitting: Internal Medicine

## 2011-07-17 NOTE — Telephone Encounter (Signed)
Patient call looking for amlodipine 5 mg she doesn't have any more pills

## 2011-07-17 NOTE — Telephone Encounter (Signed)
Per Pharmacy :    [name] is a 56 year old female has requested a refill of Amlodipine 5 mg.    Last Physical Exam 05/28/09    Last Office Visit 03/29/11    Other Med Adult:  Most Recent BP Reading(s)  03/29/11 : 112/70        Cholesterol (mg/dl)   Date     Date  Value    10/14/2010  145    ----------    LDL (mg/dl)   Date     Date  Value    10/14/2010  82    ----------    HDL (mg/dl)   Date     Date  Value    10/14/2010  49    ----------    TRIGLYCERIDE (mg/dl)   Date     Date  Value    05/03/2007  48    ----------        THYROID SCREEN TSH (uIU/ml)   Date     Date  Value    11/23/2004  1.29    ----------        THYROID STIM HORMONE (uIU/mL)   Date     Date  Value    09/23/2010  1.52    ----------      No results found for this basename: hgba1c        No results found for this basename: INR       Documented patient preferred pharmacies:  [facility] ([facility])Phone: [phone] Fax: [phone]

## 2011-09-28 ENCOUNTER — Encounter (HOSPITAL_BASED_OUTPATIENT_CLINIC_OR_DEPARTMENT_OTHER): Payer: Self-pay | Admitting: Internal Medicine

## 2011-09-28 ENCOUNTER — Ambulatory Visit (HOSPITAL_BASED_OUTPATIENT_CLINIC_OR_DEPARTMENT_OTHER): Payer: PRIVATE HEALTH INSURANCE | Admitting: Internal Medicine

## 2011-09-28 VITALS — BP 114/72 | HR 88 | Temp 98.3°F | Wt 145.6 lb

## 2011-09-28 DIAGNOSIS — Z0000 Encounter for general adult medical examination without abnormal findings: Principal | ICD-10-CM

## 2011-09-28 NOTE — Progress Notes (Signed)
SUBJECTIVE  Tina [name]  is a 56 year old female who is coming today for a routine physical exam.    Current complaints: none    Social History Narrative    Immigrated from [place] in 2003          Family History    OTHER Mother     Comment: HTN, Heart problems    DES Exp Father     Comment: Died during heart catheterization at age 24    Glaucoma Father     Macular Degeneration Father        Review Of Systems  Constitutional: denies fever, weight loss, fatigue, night sweats  Eyes: denies any visual changes  Ears, Nose, Mouth, Throat: denies hearing loss, nasal congestion, sore throath  Respiratory: negative, cough, sputum, hemoptysis, asthma, wheezing and dyspnea on exertion  Cardiovascular: negative, palpitations, irregular heart beat and chest pain  Gastrointestinal: negative, poor appetite, dysphagia, nausea, melena, hematochezia, constipation and diarrhea  Genitourinary: negative, nocturia, dysuria, frequency, hesitancy and hematuria  Musculoskeletal: denies joint pain, muscle pain, weakness. Positive occasional b/l knee pain.  Integumentary: denies any rash or skin lesions  Neurological: denies any headache, numbness/tingling, weakness  Psychiatric: denies any depressed mood, SI or HI, visual or auditory hallucinations  Endocrine: denies any heat or cold intolerance, denies polyuria, polydipsia, polyphagia  Hematologic/lymphatic: denies any increased lymph nodes, any easy bruising, bleeding disorder or abnormal bleeding.    OBJECTIVE:  BP 114/72  Pulse 88  Temp(Src) 98.3 F (36.8 C) (Oral)  Wt 145 lb 9.6 oz (66.044 kg)  BMI 26.62 kg/m2  SpO2 95%  General appearance: healthy, alert, well developed, well nourished  Hydration: well hydrated  Ears: R TM - normal, L TM - normal  Nose: normal  Oropharynx: normal  Neck: supple, no adenopathy and thyroid normal size, non-tender,  without nodularity  Lungs: clear to auscultation  Heart: regular rate and rhythm and no murmurs, clicks, or gallops  Breasts are  symmetric.  No dominant, discrete, fixed  or suspicious masses are noted. B/l symmetric fibrocystic densities are noted bilaterally. No skin or nipple changes or axillary nodes.  Abdomen: flat, normal bowel sounds.   Tenderness: none  Masses: none  Organomegaly: none  Extremities: With normal capillary refill, normal strength, and normal deep tendon reflexes  Pelvic exam: normal vagina and vulva, EGBUS within normal limits, normal cervix without lesions, polyps or tenderness, uterus normal size, shape, consistency, no mass or tenderness, adnexa normal in size without mass or tenderness.    ASSESSMENT:  (V70.0) Routine general medical examination at a health care facility  (primary encounter diagnosis)  Comment: Overall normal exam, pap limited due to pt feeling slightly uncomfortable during the procedure. She was notified that she will be called if unsatisfactory.  Plan: CYTP CERV/VAG AUTO THIN LAYER PREP MNL SCREEN,         HUMAN PAPILLOMAVIRUS

## 2011-10-02 LAB — HUMAN PAPILLOMAVIRUS (HPV): HUMAN PAPILLOMAVIRUS: NEGATIVE

## 2011-10-02 LAB — CYTOPATH, C/V, THIN LAYER

## 2011-10-06 ENCOUNTER — Ambulatory Visit (HOSPITAL_BASED_OUTPATIENT_CLINIC_OR_DEPARTMENT_OTHER): Payer: PRIVATE HEALTH INSURANCE | Admitting: Lab

## 2011-10-06 DIAGNOSIS — Z0000 Encounter for general adult medical examination without abnormal findings: Principal | ICD-10-CM

## 2011-10-07 LAB — FECAL OCCULT (POINT OF CARE) HOME TEST 3 CARDS
LOT #: 2821
OCCULT BLOOD: NEGATIVE
OCCULT BLOOD: NEGATIVE
OCCULT BLOOD: NEGATIVE

## 2011-10-07 NOTE — Progress Notes (Signed)
.    TEST DONE  RCT

## 2012-01-03 ENCOUNTER — Encounter (HOSPITAL_BASED_OUTPATIENT_CLINIC_OR_DEPARTMENT_OTHER): Payer: Self-pay | Admitting: Internal Medicine

## 2012-01-03 ENCOUNTER — Ambulatory Visit (HOSPITAL_BASED_OUTPATIENT_CLINIC_OR_DEPARTMENT_OTHER): Payer: PRIVATE HEALTH INSURANCE | Admitting: Internal Medicine

## 2012-01-03 ENCOUNTER — Ambulatory Visit (HOSPITAL_BASED_OUTPATIENT_CLINIC_OR_DEPARTMENT_OTHER): Payer: Self-pay | Admitting: Internal Medicine

## 2012-01-03 VITALS — BP 120/68 | HR 98 | Temp 98.8°F | Wt 142.0 lb

## 2012-01-03 DIAGNOSIS — Z1231 Encounter for screening mammogram for malignant neoplasm of breast: Secondary | ICD-10-CM

## 2012-01-03 DIAGNOSIS — M25571 Pain in right ankle and joints of right foot: Secondary | ICD-10-CM

## 2012-01-03 DIAGNOSIS — M25572 Pain in left ankle and joints of left foot: Principal | ICD-10-CM

## 2012-01-03 NOTE — Progress Notes (Signed)
Tina [name] is a 56 year old female who presents with the following problems:    B/l ankle pain and swelling:  Pt comes today concerned that her inner ankles become tender and swollen at the end of the day. 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 She notices that today she doesn't have ankle pain.  She wears very flat sandals all day. She mentions worse swelling when she spent an entire day walking in [place].    ROS: Pt has associated medial knee pain b/l. There is no swelling of the knees but the pain can be quite significant.    Social History Narrative    Immigrated from [place] in 2003          Review of Patient's Allergies indicates:   Pravachol               Nausea Only, Other (See Comments)    Comment:Malaise        Current Outpatient Prescriptions on File Prior to Visit:  amlodipine (NORVASC) 5 MG tablet TAKE ONE (1) TABLET DAILY Disp: 30 tablet Rfl: 11   ibuprofen (ADVIL,MOTRIN) 800 MG tablet TAKE ONE TABLET BY MOUTH EVERY SIX HOURS AS NEEDED FOR PAIN. Disp: 30 tablet Rfl: 0   simvastatin (ZOCOR) 20 MG tablet Take 1 tablet by mouth nightly. Disp: 30 tablet Rfl: 11     No current facility-administered medications on file prior to visit.    OBJECTIVE:  BP 120/68  Pulse 98  Temp(Src) 98.8 F (37.1 C) (Oral)  Wt 142 lb (64.411 kg)  BMI 25.97 kg/m2  SpO2 99%  General: seated comfortable in NAD, alert, pleasant and cooperative  EOMI  Moist mucus membranes  Knee exam - both normal; full range of motion, no pain on motion, no effusion, tenderness, masses, ligamentous instability or deformity noted.  Ankle exam - both sides normal; full range of motion, no pain on motion, no effusion, tenderness, ligamentous instability or deformity noted. The area that usually becomes swollen is around the medial malleolus.    A/P:  (719.47) Bilateral ankle pain  (primary encounter diagnosis)  Comment: This seems to be perpetuated in a pt wearing very flat shoes causing strain over the  medial malleolus after long days of walking. This can also translate in pain over the medial knee which she has from time to time as well. Pt advised to wear more comfortable shoes with shock absorbency, a 1 inch hill if possible, or inserts with arc support. She can take tylenol or motrin PRN if the pain recurs and apply ice over the affected area. A better posture will help control this type of pain.  Pt agrees with the plan and will call the clinic if she has any worsening symptoms.    (V76.12) Other screening mammogram  Plan: ORDER FOR MAMMOGRAM (SCREENING)

## 2012-01-09 ENCOUNTER — Ambulatory Visit (HOSPITAL_BASED_OUTPATIENT_CLINIC_OR_DEPARTMENT_OTHER): Payer: Self-pay | Admitting: Internal Medicine

## 2012-01-09 LAB — MA SCREENING MAMMO BILATERAL WITH CAD

## 2012-02-12 ENCOUNTER — Other Ambulatory Visit (HOSPITAL_BASED_OUTPATIENT_CLINIC_OR_DEPARTMENT_OTHER): Payer: Self-pay | Admitting: Internal Medicine

## 2012-02-12 DIAGNOSIS — E785 Hyperlipidemia, unspecified: Secondary | ICD-10-CM

## 2012-02-12 NOTE — Progress Notes (Signed)
Person calling on behalf of patient: Patient (self)    Tina [name] is a 56 year old female       - medication(s) request: Simvastatin 20 mg  - last office visit: 01/03/12  - last physical exam: 09/28/11      Statin Med:  Lipids   Cholesterol (mg/dl)   Date  Value    05/20/1094  145    ----------    LDL DIRECT (mg/dl)   Date  Value    0/08/5407  82    ----------    HDL (mg/dl)   Date  Value    12/13/1912  49    ----------    TRIGLYCERIDES (mg/dl)   Date  Value    78/29/5621  48    ----------  LFTs     ALANINE AMINOTRANSFERASE (ALT) (IU/L)   Date  Value    05/30/2006  19    ----------      ASPARTATE AMINOTRANSFERAS (IU/L)   Date  Value    05/30/2006  18    ----------      ALBUMIN (g/dl)   Date  Value    07/20/6576  3.8    ----------      TOTAL PROTEIN (g/dl)   Date  Value    4/69/6295  7.0    ----------      No results found for this basename: DBILI      BILIRUBIN TOTAL (mg/dl)   Date  Value    2/84/1324  0.5    ----------      ALKALINE PHOSPHATASE (IU/L)   Date  Value    05/30/2006  68    ----------          Documented patient preferred pharmacies:    [facility] ([facility])  Phone: [phone] Fax: [phone]

## 2012-02-12 NOTE — Progress Notes (Signed)
[facility]    Person calling on behalf of patient: Patient (self)    May list multiple medications in this section    Medicine Name: SIMVASTATIN    Dosage:     Frequency (how many pills, how many times a day):     Number of pills left:    Documented patient preferred pharmacies:   [facility] ([facility])  Phone: [phone] Fax: [phone]      Patient's language of care: Portuguese    Patient needs a Portuguese interpreter.

## 2012-03-12 ENCOUNTER — Encounter (HOSPITAL_BASED_OUTPATIENT_CLINIC_OR_DEPARTMENT_OTHER): Payer: Self-pay | Admitting: Ambulatory Care

## 2012-03-12 ENCOUNTER — Ambulatory Visit (HOSPITAL_BASED_OUTPATIENT_CLINIC_OR_DEPARTMENT_OTHER): Payer: PRIVATE HEALTH INSURANCE

## 2012-03-12 DIAGNOSIS — Z23 Encounter for immunization: Principal | ICD-10-CM

## 2012-03-12 NOTE — Progress Notes (Signed)
Pt requesting Flu Vaccine .  Ordered by Provider  VIS given      Confirmed patient's name and date of birth.  Pt denies allergies to this vaccine.   Pt denies allergies to egg or egg products.  Pt denies allergy to contact lens solution.          Pt denies adverse effects from previous administration of this medication. Pt denies history of Guillain-Barre' syndrome.  Pt denies moderate/severe illness at this time.  Risks and benefits of Flu Vaccine reviewed with pt.   VIS for Flu Vaccine  offered and reviewed with pt.    Flu Vaccine 0.5.ml IM administered. Tolerated well by patient.  Patient denies adverse effects from injection at this time.   Patient encouraged to utilize arm and not favor it.  Patient informed may  take pain reliever of choice and to apply ice for discomfort if necessary.  Patient will call with any questions or concerns.  Please refer to Imm./Inj. section for administration site, lot # and exp. date.  Patient was encouraged to wait for twenty minutes in the lobby.    Reviewed with patient importance of monitoring injection site.  If redness, rash, fever, pain , or swelling at site to call clinic.   Aware to call clinic if any adverse reaction noted.

## 2012-06-07 ENCOUNTER — Ambulatory Visit (HOSPITAL_BASED_OUTPATIENT_CLINIC_OR_DEPARTMENT_OTHER): Payer: PRIVATE HEALTH INSURANCE | Admitting: Internal Medicine

## 2012-06-07 ENCOUNTER — Encounter (HOSPITAL_BASED_OUTPATIENT_CLINIC_OR_DEPARTMENT_OTHER): Payer: Self-pay | Admitting: Internal Medicine

## 2012-06-07 VITALS — BP 118/66 | HR 78 | Temp 98.4°F | Wt 141.0 lb

## 2012-06-07 DIAGNOSIS — I10 Essential (primary) hypertension: Secondary | ICD-10-CM

## 2012-06-07 DIAGNOSIS — R232 Flushing: Principal | ICD-10-CM

## 2012-06-07 DIAGNOSIS — R42 Dizziness and giddiness: Secondary | ICD-10-CM

## 2012-06-07 MED ORDER — MECLIZINE HCL 12.5 MG PO TABS
12.50 mg | ORAL_TABLET | Freq: Three times a day (TID) | ORAL | Status: AC
Start: 2012-06-07 — End: 2012-06-10

## 2012-06-07 MED ORDER — FLUOXETINE HCL 20 MG PO TABS
20.0000 mg | ORAL_TABLET | Freq: Every day | ORAL | Status: DC
Start: 2012-06-07 — End: 2012-06-10

## 2012-06-07 MED ORDER — AMLODIPINE BESYLATE 5 MG PO TABS
5.0000 mg | ORAL_TABLET | Freq: Every day | ORAL | Status: DC
Start: 2012-06-07 — End: 2013-06-09

## 2012-06-07 NOTE — Progress Notes (Signed)
SUBJECTIVE:    Tina [name] is a 57 y.o who presents to clinic with postmenopausal symptoms    Requests to go back on Prozac. Reports return of hot flashes, insomnia, irritability. Took Prozac previously for these symptoms with good effect    Also reports having onset of dizziness, decrease in hearing, nausea 4 weeks ago. Reports having similar symptoms while in [place] and was rx for Labyrinthitis.  Has been taking Ginkgo Biloba OTC for her dizziness x 2 weeks with some effect    Denies fevers, chills, headaches, body aches, chest pain, palpitations, SOB, n/v, abdominal pain, pelvic pain, numbness, tingling, change in bowel and urinary patterns     OBJECTIVE:  General: A+0x3,NAD  BP 118/66  Pulse 78  Temp(Src) 98.4 F (36.9 C)  Wt 141 lb (63.957 kg)  BMI 25.78 kg/m2  SpO2 97%  HEENT: NT/AT PERRLA, no conjunctival injection noted, frontal and maxillary sinus non tender. Nares patent, no erythema noted, no nasal drainage,discharge, lesions noted. Right TM with old scaring noted, both TMs intact, no perforation or effusion noted, Throat clear,MMM, no exudate noted.  NECK: supple, no nodules, negative lymphadenopathy  CHEST: Clear to auscultation, no wheezes or rales.  CV: RRR, S1, S2 WNL, no murmurs  Neuro: CN II-XII intact, steady gait, normal reflexes    ASSESSMENT/PLAN  (782.62) Hot flashes not due to menopause  (primary encounter diagnosis)  Comment:  Will restart Prozac. 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    Plan: meclizine (ANTIVERT) 12.5 MG TABS            (401.9) Hypertension  Comment: med refill given  Plan: amLODIPine (NORVASC) 5 MG tablet            I have spent 25 minutes in face to face time with this  patient/patient proxy of which > 50% was in counseling or coordination of care regarding above issues/Dx.

## 2012-06-10 MED ORDER — FLUOXETINE HCL 20 MG PO TABS
ORAL_TABLET | ORAL | Status: DC
Start: 2012-06-10 — End: 2012-07-10

## 2012-07-10 ENCOUNTER — Ambulatory Visit (HOSPITAL_BASED_OUTPATIENT_CLINIC_OR_DEPARTMENT_OTHER): Payer: PRIVATE HEALTH INSURANCE | Admitting: Ophthalmology

## 2012-07-10 ENCOUNTER — Encounter (HOSPITAL_BASED_OUTPATIENT_CLINIC_OR_DEPARTMENT_OTHER): Payer: Self-pay | Admitting: Internal Medicine

## 2012-07-10 ENCOUNTER — Ambulatory Visit (HOSPITAL_BASED_OUTPATIENT_CLINIC_OR_DEPARTMENT_OTHER): Payer: PRIVATE HEALTH INSURANCE | Admitting: Internal Medicine

## 2012-07-10 VITALS — BP 130/80 | HR 76 | Temp 98.0°F | Wt 141.0 lb

## 2012-07-10 DIAGNOSIS — M79652 Pain in left thigh: Secondary | ICD-10-CM

## 2012-07-10 DIAGNOSIS — R232 Flushing: Secondary | ICD-10-CM

## 2012-07-10 DIAGNOSIS — H524 Presbyopia: Principal | ICD-10-CM

## 2012-07-10 DIAGNOSIS — R42 Dizziness and giddiness: Principal | ICD-10-CM

## 2012-07-10 LAB — CBC WITH PLATELET
HEMATOCRIT: 42.4 % (ref 34.1–44.9)
HEMOGLOBIN: 13.5 g/dL (ref 11.2–15.7)
MEAN CORP HGB CONC: 31.8 g/dL (ref 31.0–37.0)
MEAN CORPUSCULAR HGB: 30.6 pg (ref 26.0–34.0)
MEAN CORPUSCULAR VOL: 96.1 fL (ref 80.0–100.0)
MEAN PLATELET VOLUME: 13.1 fL — ABNORMAL HIGH (ref 8.7–12.5)
PLATELET COUNT: 197 10*3/uL (ref 150–400)
RBC DISTRIBUTION WIDTH STD DEV: 43.3 fL (ref 35.1–46.3)
RBC DISTRIBUTION WIDTH: 12.6 % (ref 11.5–14.3)
RED BLOOD CELL COUNT: 4.41 M/uL (ref 3.90–5.20)
WHITE BLOOD CELL COUNT: 6.2 10*3/uL (ref 4.0–11.0)

## 2012-07-10 LAB — COMPREHENSIVE METABOLIC PANEL
ALANINE AMINOTRANSFERASE: 30 IU/L (ref 7–35)
ALBUMIN: 4.1 g/dl (ref 3.4–4.8)
ALKALINE PHOSPHATASE: 69 IU/L (ref 25–106)
ANION GAP: 9 mmol/L (ref 3–11)
ASPARTATE AMINOTRANSFERASE: 26 IU/L (ref 8–34)
BILIRUBIN TOTAL: 0.6 mg/dl (ref 0.2–1.1)
BUN (UREA NITROGEN): 11 mg/dl (ref 6–20)
CALCIUM: 9.6 mg/dl (ref 8.6–10.3)
CARBON DIOXIDE: 27 mmol/L (ref 22–32)
CHLORIDE: 104 mmol/L (ref 101–111)
CREATININE: 0.7 mg/dl (ref 0.4–1.2)
ESTIMATED GLOMERULAR FILT RATE: >60 mL/min (ref >60)
Glucose Random: 92 mg/dl (ref 74–160)
POTASSIUM: 4.4 mmol/L (ref 3.5–5.1)
SODIUM: 140 mmol/L (ref 135–144)
TOTAL PROTEIN: 6.7 g/dl (ref 5.9–7.5)

## 2012-07-10 LAB — PLATELET SCAN
PLATELET ESTIMATE: NORMAL
PLATELET MORPHOLOGY (ABNORMAL): NORMAL

## 2012-07-10 LAB — THYROID SCREEN TSH REFLEX FT4: THYROID SCREEN TSH REFLEX FT4: 1.1 u[IU]/mL (ref 0.34–5.60)

## 2012-07-10 LAB — CREATINE KINASE TOTAL: CREATINE KINASE TOTAL: 92 IU/L (ref 21–215)

## 2012-07-10 MED ORDER — FLUOXETINE HCL 20 MG PO TABS
20.0000 mg | ORAL_TABLET | Freq: Every day | ORAL | Status: DC
Start: 2012-07-10 — End: 2012-09-11

## 2012-07-10 MED ORDER — MECLIZINE HCL 25 MG PO TABS
25.00 mg | ORAL_TABLET | Freq: Three times a day (TID) | ORAL | Status: AC | PRN
Start: 2012-07-10 — End: 2012-08-09

## 2012-07-10 NOTE — Nursing Note (Signed)
>>   Tina [name]     Wed Jul 10, 2012  1:31 PM  Pt reports a decline in distance and near vision.  No ocular symptoms reported today.    Ocular HX  Hyperopia  Astigmatism  Presbyopia  H/O Mild allergic conjunctivitis   H/O HAs

## 2012-07-10 NOTE — Progress Notes (Signed)
Tina [name] was seen in the [facility]  for a complaint of decreased near vision.  Pt was examined and refracted and would benefit from glasses.  The patient was given an eye glass prescription which they can get filled at the optical shop of their choice.  Options including over- the- counter readers, single vision readers, bifocals, and progressive add lenses were discussed with patient.

## 2012-07-10 NOTE — Progress Notes (Signed)
SUBJECTIVE:    Tina [name] is a 57 y.o who presents to clinic with c/c of f/u of dizziness    Seen in clinic 06/07/12 c/c of dizziness, decrease in hearing.  Reported having similar symptoms while in [place] and was rx for Labyrinthitis.   Had been taking  Ginkgo Biloba OTC for her dizziness x 2 weeks with some effect. Was  Rx Meclizine 12.5 mg prn at 06/07/12 visit  Reports having continuation of dizziness, occurs daily upon standing up quickly, feels the room spinning, lasting few seconds. Denies headaches, lightheadedness, chest pain, sob, palpitations, n/v, change in vision, gait, numbness, tingling.   Has taken Meclizine 12.5 mg TID with little effect.   Denies smoking, ETOH, drug use  Restarted Fluoxetine for postmenopausal symptoms, hot flashes, has been feeling much better since restarting Fluoxetine.   Left thigh pain x 4 weeks. Has taken Motrin with little effect. Located at the lateral aspect of her thigh, denies injury of her leg, hip, redness, swelling.     OBJECTIVE:  General: A+0x3,NAD  BP 130/80  Pulse 76  Temp(Src) 98 F (36.7 C) (Oral)  Wt 141 lb (63.957 kg)  BMI 25.78 kg/m2  SpO2 98%   Extended vitals:    Orthostatic Vitals   BP Pulse Position Site Cuff Size Time Date   116/60 74 Standing Left Arm Regular 12:08 PM 07/10/2012   118/54 77 Sitting Left Arm Regular 12:04 PM 07/10/2012   118/58 73 Supine Left Arm Regular 12:01 PM 07/10/2012     HEENT: NT/AT PERRLA, no conjunctivial injection noted, frontal and maxillary sinus non tender. Nares patent, no erythema noted, no nasal drainage,discharge, lesions noted.   TM intact, no perforation or effusion noted, Throat clear,MMM, no exudate noted.  NECK: supple, no nodules, negative lymphadenopathy  CHEST: Clear to auscultation, no wheezes or rales.  CV: RRR, S1, S2 WNL, no murmurs  Extremities: extremities, peripheral pulses and reflexes normal, Homan's sign is negative, no sign of DVT.  no edema, redness or tenderness in the calves or  thighs.  Neuro: CN II-XII grossly intact, steady gait, normal reflexes      ASSESSMENT/PLAN  (780.4) Dizziness and giddiness  (primary encounter diagnosis)  Comment: ? Etiology to pt dizziness.   Is not orthostatic   Most likely due to labyrinthitis  Will increase Meclizine dose to 25 mg TID prn  Referred to ENT   Plan: meclizine (ANTIVERT) 25 MG TABS, COLLECTION         VENOUS BLOOD VENIPUNCTURE, THYROID SCREEN TSH         REFLEX FT4, CBC + PLT, COMPREHENSIVE METABOLIC         PANEL, REFERRAL TO ENT ( INT), PLATELET SCAN           (782.62) Hot flashes not due to menopause  Comment: stable with Fluoxetine  Refill given  Plan: FLUoxetine (PROZAC) 20 MG tablet         (729.5) Pain of left thigh  Comment: does appear to be muscular thigh pain  Is on a statin, will check CK today  Warm compresses, Tylenol prn  Advised pt to report continuing or worsening symptoms. Pt agreed to plan.  Plan: CREATINE KINASE (CK)         I have spent 25 minutes in face to face time with this patient/patient proxy of which > 50% was in counseling or coordination of care regarding above issues/Dx.

## 2012-07-24 ENCOUNTER — Ambulatory Visit (HOSPITAL_BASED_OUTPATIENT_CLINIC_OR_DEPARTMENT_OTHER): Payer: PRIVATE HEALTH INSURANCE | Admitting: Internal Medicine

## 2012-08-07 ENCOUNTER — Ambulatory Visit (HOSPITAL_BASED_OUTPATIENT_CLINIC_OR_DEPARTMENT_OTHER): Payer: PRIVATE HEALTH INSURANCE | Admitting: Otolaryngology

## 2012-08-14 ENCOUNTER — Ambulatory Visit (HOSPITAL_BASED_OUTPATIENT_CLINIC_OR_DEPARTMENT_OTHER): Payer: PRIVATE HEALTH INSURANCE | Admitting: Internal Medicine

## 2012-09-11 ENCOUNTER — Encounter (HOSPITAL_BASED_OUTPATIENT_CLINIC_OR_DEPARTMENT_OTHER): Payer: Self-pay | Admitting: Internal Medicine

## 2012-09-11 ENCOUNTER — Ambulatory Visit (HOSPITAL_BASED_OUTPATIENT_CLINIC_OR_DEPARTMENT_OTHER): Payer: PRIVATE HEALTH INSURANCE | Admitting: Internal Medicine

## 2012-09-11 VITALS — BP 116/74 | HR 68 | Temp 97.8°F | Ht 62.75 in | Wt 147.0 lb

## 2012-09-11 DIAGNOSIS — R42 Dizziness and giddiness: Secondary | ICD-10-CM

## 2012-09-11 DIAGNOSIS — R232 Flushing: Principal | ICD-10-CM

## 2012-09-11 MED ORDER — FLUOXETINE HCL 20 MG PO TABS
20.0000 mg | ORAL_TABLET | Freq: Every day | ORAL | Status: DC
Start: 2012-09-11 — End: 2014-01-13

## 2012-09-11 NOTE — Progress Notes (Signed)
SUBJECTIVE:    Tina [name] is a 57 y.o who presents to clinic f/u of dizziness   Seen 07/10/12 with dizziness. Was taking Ginkgo Biloba from [place]  Was also Rx Meclizine  prn  Reports being asymptomatic of dizziness since prior visit  Reports feeling well, denies any medical complaints  Request to have a refill for Fluoxetine- has help her insomnia, anxiety, hot flashes    OBJECTIVE:  General: A+0x3,NAD  BP 116/74  Pulse 68  Temp(Src) 97.8 F (36.6 C) (Oral)  Ht 5' 2.75" (1.594 m)  Wt 147 lb (66.679 kg)  BMI 26.24 kg/m2  SpO2 98%    ASSESSMENT/PLAN  (782.62) Hot flashes not due to menopause  (primary encounter diagnosis)  Comment: stable with Fluoxetine  Med refill given  Plan: FLUoxetine (PROZAC) 20 MG tablet            (780.4) Dizziness  Comment: asymptomatic of dizziness  Will continue to monitor  Advised pt to report return of dizziness   Plan:    To f/u in 1 month for PE, fasting lipids, lfts

## 2012-10-02 ENCOUNTER — Ambulatory Visit (HOSPITAL_BASED_OUTPATIENT_CLINIC_OR_DEPARTMENT_OTHER): Payer: PRIVATE HEALTH INSURANCE | Admitting: Internal Medicine

## 2012-10-30 ENCOUNTER — Ambulatory Visit (HOSPITAL_BASED_OUTPATIENT_CLINIC_OR_DEPARTMENT_OTHER): Payer: PRIVATE HEALTH INSURANCE | Admitting: Otolaryngology

## 2012-11-08 ENCOUNTER — Ambulatory Visit (HOSPITAL_BASED_OUTPATIENT_CLINIC_OR_DEPARTMENT_OTHER): Payer: PRIVATE HEALTH INSURANCE | Admitting: Internal Medicine

## 2012-11-20 ENCOUNTER — Ambulatory Visit (HOSPITAL_BASED_OUTPATIENT_CLINIC_OR_DEPARTMENT_OTHER): Payer: PRIVATE HEALTH INSURANCE | Admitting: Internal Medicine

## 2012-11-20 ENCOUNTER — Telehealth (HOSPITAL_BASED_OUTPATIENT_CLINIC_OR_DEPARTMENT_OTHER): Payer: Self-pay | Admitting: Internal Medicine

## 2012-11-20 NOTE — Progress Notes (Signed)
.  called pt lm on vm letting pt know appointment today with carolyn was cancelled asked pt to call back to reschedule.

## 2012-12-31 ENCOUNTER — Other Ambulatory Visit (HOSPITAL_BASED_OUTPATIENT_CLINIC_OR_DEPARTMENT_OTHER): Payer: Self-pay | Admitting: Internal Medicine

## 2012-12-31 DIAGNOSIS — Z1231 Encounter for screening mammogram for malignant neoplasm of breast: Principal | ICD-10-CM

## 2013-01-10 ENCOUNTER — Encounter (HOSPITAL_BASED_OUTPATIENT_CLINIC_OR_DEPARTMENT_OTHER): Payer: Self-pay | Admitting: Internal Medicine

## 2013-01-10 ENCOUNTER — Ambulatory Visit (HOSPITAL_BASED_OUTPATIENT_CLINIC_OR_DEPARTMENT_OTHER): Payer: PRIVATE HEALTH INSURANCE | Admitting: Internal Medicine

## 2013-01-10 VITALS — BP 122/70 | HR 76 | Temp 98.5°F | Ht 62.99 in | Wt 149.2 lb

## 2013-01-10 DIAGNOSIS — Z1212 Encounter for screening for malignant neoplasm of rectum: Secondary | ICD-10-CM

## 2013-01-10 DIAGNOSIS — I10 Essential (primary) hypertension: Secondary | ICD-10-CM

## 2013-01-10 DIAGNOSIS — Z1211 Encounter for screening for malignant neoplasm of colon: Secondary | ICD-10-CM

## 2013-01-10 DIAGNOSIS — Z0000 Encounter for general adult medical examination without abnormal findings: Principal | ICD-10-CM

## 2013-01-10 DIAGNOSIS — M25552 Pain in left hip: Secondary | ICD-10-CM

## 2013-01-10 DIAGNOSIS — E785 Hyperlipidemia, unspecified: Secondary | ICD-10-CM

## 2013-01-10 DIAGNOSIS — N924 Excessive bleeding in the premenopausal period: Secondary | ICD-10-CM

## 2013-01-10 LAB — LIPID PANEL
Cholesterol: 170 mg/dL (ref 0–239)
HIGH DENSITY LIPOPROTEIN: 56 mg/dL (ref 40–60)
LOW DENSITY LIPOPROTEIN DIRECT: 105 mg/dL (ref 0–189)
TRIGLYCERIDES: 70 mg/dL (ref 0–150)

## 2013-01-10 LAB — HEPATIC FUNCTION PANEL
ALANINE AMINOTRANSFERASE: 32 U/L (ref 12–45)
ALBUMIN: 4 g/dL (ref 3.4–5.0)
ALKALINE PHOSPHATASE: 85 U/L (ref 45–117)
ASPARTATE AMINOTRANSFERASE: 24 U/L (ref 8–34)
BILIRUBIN DIRECT: 0.1 mg/dl (ref 0.0–0.2)
BILIRUBIN TOTAL: 0.4 mg/dL (ref 0.2–1.0)
INDIRECT BILIRUBIN: 0.3 mg/dL (ref 0.2–0.9)
TOTAL PROTEIN: 7.2 g/dL (ref 6.4–8.2)

## 2013-01-10 NOTE — Progress Notes (Signed)
Chief Complaint:  Tina [name] is a 57 year old female who presents for a physical exam.     1. Left hip and thigh pain- continues to have left hip pain with radiation of pain down the lateral aspect of her left thigh x 8 months. Reports having pain of her left hip and thigh with ambulation and rest    2. Hx of menorrhagia- had a [place] IUD placed 2010 which has helped her menorrhagia      Patient Active Problem List:     Multiple Breast Cysts     Menorrhagia     Hypertension     Hyperlipidemia LDL goal < 130     Hot flashes not due to menopause     Tendonitis     Sinusitis     DPH-CARE COORDINATION PROGRAM PARTICIPANT     Tension headache, chronic     Routine general medical examination at a health care facility     Atrial ectopy        Current Outpatient Prescriptions:  FLUoxetine (PROZAC) 20 MG tablet Take 1 tablet by mouth daily. Disp: 30 tablet Rfl: 11   amLODIPine (NORVASC) 5 MG tablet Take 1 tablet by mouth daily. Disp: 30 tablet Rfl: 11   simvastatin (ZOCOR) 20 MG tablet TAKE 1 TABLET BY MOUTH EVERY NIGHT Disp: 30 tablet Rfl: 11     No current facility-administered medications for this visit.    Allergies:  Review of Patient's Allergies indicates:   Pravachol               Nausea Only, Other (See Comments)    Comment:Malaise    Health Maintenance:  Physical Exam (Age 23+) due on 09/27/2012  Fecal Occult Blood Age 23+ due on 10/06/2012  Flu Vaccine Seasonal due on 01/13/2013  Tetanus (16 And Over) due on 04/19/2013  Mammography due on 01/08/2014  Lipid Screening due on 10/14/2015  Health Care Proxy due on 06/07/2016  Pap Smear due on 09/27/2016  Hpv Screening due on 09/27/2016  Hiv Screening Completed  Hep B High Risk Vaccine Eval (Once) Completed    Immunizations:  Immunization History   Administered Date(s) Administered   . Depo Provera 01/02/2005, 04/24/2008, 07/10/2008, 09/04/2008, 10/16/2008, 11/13/2008   . Flu Vaccine > 3 Yrs 03/19/2009, 01/28/2010, 03/22/2011, 03/12/2012   . H1N1 0.74ml W/ Preserv 3 &  > 07/10/2008   . Td 04/20/2003       Histories:    Past Medical History    H/o Condyloma     Comment: 2003 (vulvar)    S/P appendectomy     S/P laparoscopic procedure     Comment: Exploratory    Presbyopia     Arthritis     Hyperopia     Right ear pain 06/17/2010    Comment: Seen by ENT. 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 No signs of infection on the right. Sent for hearing test.          Past Surgical History    APPENDECTOMY  2000    Comment laparoscopy    ENDOMETRIAL BX W/WO ENDOCERVIX BX W/O DILAT SPX  9/04    Comment endometrial bx = negative    OB ANTEPARTUM CARE CESAREAN DLVR & POSTPARTUM      Comment tubal ligation    HYSTEROSCOPY BX ENDOMETRIUM&/POLYPC W/WO D&C      Comment 3 times in [place]       Social History   Marital Status: Married  Spouse Name: Conservation officer, historic buildings  Years of Education: N/A  Number of Children: 4     Occupational History  Babysitting  Works daytime     Social History Main Topics   Smoking status: Never Smoker     Smokeless tobacco: Not on file    Alcohol Use: Yes    Comment: Occasionally    Drug Use: No    Sexually Active: Yes  Partner(s): Female    Birth Control/ Protection: Surgical     Other Topics Concern   None on file     Social History Narrative    Immigrated from [place] in 2003       Family History    OTHER Mother     Comment: HTN, Heart problems    DES Exp Father     Comment: Died during heart catheterization at age 56    Glaucoma Father     Macular Degeneration Father        Review of Systems:                   Skin: negative  Eyes: negative  Ears/Nose/Throat: negative  Respiratory: negative  Cardiovascular: negative  Gastrointestinal: negative  Genitourinary: negative  ZOX:WRUEAV menses, no abnormal bleeding, pelvic pain or discharge  no breast pain or new or enlarging lumps on self exam  Musculoskeletal: left hip and thigh pain.   Neurologic: negative  Endocrine: negative  Psychiatric: negative  Hematologic/Lymphatic/Immunologic: negative    Physical:  BP 122/70  Pulse 76   Temp(Src) 98.5 F (36.9 C)  Ht 5' 2.99" (1.6 m)  Wt 149 lb 3.2 oz (67.677 kg)  BMI 26.44 kg/m2  SpO2 96%  General appearance: healthy, alert, well developed, well nourished  Eyes: conjunctivae/corneas clear. PERRL, EOM's intact. Fundi benign  Skin: skin color, texture, turgor are normal  Head: Normocephalic. No masses, lesions, tenderness or abnormalities  Ears: External ears normal. Canals clear. TM's normal.  Nose/Sinuses: Nares normal. Septum midline. Mucosa normal. No drainage or sinus tenderness.  Oropharynx: Lips, mucosa, and tongue normal. Teeth and gums normal. Oropharynx moist and without lesion  Neck: Neck supple. No adenopathy. Thyroid symmetric, normal size, and without nodularity  Back: Back symmetric, no curvature. ROM normal. No CVA tenderness.  Lungs: Percussion normal. Good diaphragmatic excursion. Lungs clear to auscultation bilaterally  Heart: PMI normal. No lifts, heaves, or thrills. RRR. No murmurs, clicks, gallops or rubs  Breast: breasts symmetric, no dominant or suspicious mass, no skin or nipple changes, no axillary adenopathy and self exam in taught and encouraged  Abdomen: Abdomen soft, non-tender. BS normal. No masses, no organomegaly  Extremities: Extremities normal. No deformities, edema, or skin discoloration. Negative homan signs   Musculoskeletal: Spine ROM normal. Muscular strength intact.  Peripheral pulses: radial=4/4, femoral=4/4, popliteal=4/4, dorsalis pedis=4/4  Neuro: Gait normal. Reflexes normal and symmetric. Sensation grossly normal   Pelvic: External genitalia and vagina normal. IUD strings present at os Bimanual and rectovaginal exam normal  Rectal: negative    Health Counseling:  Smoking:  Reviewed and Discussed  Substance Use Issues:   Reviewed and Discussed   Seat Belts:  Reviewed and Discussed  Protective Sports Equipment:  Reviewed and Discussed  Smoke Dectectors:  Reviewed and Discussed  Diet, Exercise, Wt. Control:  Reviewed and Discussed  Dental Health:   Reviewed and Discussed  Vision Health:  Reviewed and Discussed  Mental Health:  Reviewed and Discussed  Domestic Violence:  Reviewed and Discussed  Sexual Health:  Reviewed and Discussed  BSE:  Reviewed and Discussed  Osteoperosis:  Reviewed and Discussed  Health Care Proxy:  Reviewed and Discussed      ASSESSMENT/PLAN:  (V70.0) Routine general medical examination at a health care facility  (primary encounter diagnosis)  Comment: Normal physical exam  Plan: to f/u in 1 year for annual PE    (627.0) Menorrhagia  Comment: stable with Minera IUD  IUD check done,   To have IUD removed 2015 with OB/GYN, will also determine if IUD is indicated ( pt is age 29)  Plan:    (V76.51) Screening for colorectal cancer  Comment: per HM. Pt would like to continue with fecal card screening  colonoscopy deferred pt per request   Plan: POC IMMUNOASSAY FECAL OCCULT BLOOD TEST         (272.4) Hyperlipidemia LDL goal < 130  Comment:  Plan: LIPID PANEL, COLLECTION VENOUS BLOOD         VENIPUNCTURE, HEPATIC FUNCTION PANEL          (719.45) Left hip pain  Comment: chronic left hip and thigh pain  Will send for x ray, r/o degenerative changes  Nsaids or Tylenol prn   Plan: XR HIP LEFT MINIMUM 2 VW         (401.9) Hypertension  Comment:   Plan: BP stable  Continue with  Norvasc 5 mg qd

## 2013-01-10 NOTE — Patient Instructions (Signed)
[facility]  [facility]  [address]  [address]  (Formerly Known [facility] [address])  [place]  [place] [address]  Clinical Nutrition Services    Calcium and Your Health    Calcium is a mineral that helps build strong bones and teeth, helps prevent brittle bones (osteoporosis) and hip fractures, and helps maintain a normal blood pressure. It is very important to get enough calcium each day.    * CALCIUM NEEDS AT ALL AGES: (RDI 2000)  Infants birth to 12 months  is 210 -270 mg/day          Child 1-8 years  is 500-.800 mg/day                     Adolescent 9-18years is 1300 mg/day                   Adults 19-30 years is 1000 mg/day  Adults 31-50 years is 1000 mg/day  Adults 51 and older is 1200 mg/day    * TO MAINTAIN GOOD BONE HEALTH, EAT A CALCIUM RICH DIET:    FOODS                                      Yogurt, plain, nonfat: 1 cup serving is 450 mgs of calcium    Ricotta cheese, part skim: 1/2 cup serving is 340 mgs of calcium    Milk, skim: 1 cup serving is 300 mgs of calcium    Milk, whole: 1 cup serving is 290 mgs of calcium    Sardines, canned, with bones: 3 oz serving is 280 mgs of calcium    Yogurt, plain, whole milk:1 cup serving is 275 mgs of calcium    Orange juice, calcium fortified:1 cup serving is 270 mgs of calcium    Swiss cheese: 1 oz serving is 270 mgs of calcium    Spinach, cooked: 1 cup serving is 240 mgs of calcium   Turnip greens, rhubarb, cooked:1 cup serving is 200 mgs of calcium   Salmon, canned, with bones: 3 oz serving is 180 mgs of calcium   Ice Cream: 1 cup serving is 175 mgs of calcium   Pudding, instant mix: 1/2 cup serving is 150 mgs of calcium   Almonds:  /2 cup serving is 150 mgs of calcium   Kale, cooked: 1 cup serving is 100 mgs of calcium   Lobster, cooked: 6 oz serving is 100 mgs of calcium   Tofu, with calcium sulfate:  3oz serving is 100 mgs of calcium     EXERCISE: Weight bearing exercises, such as walking, jogging,  racquet sports, aerobics, and weight training  help make bones stronger.     VITAMIN D: Vitamin D is essential to help your body absorb calcium. A healthy body can make its own vitamin D with the help of sunlight. But in the winter months, you may not get enough sun, and should make   sure you get it from another source, like milk or a multivitamin.     HORMONES: The hormone estrogen helps the female body and bones to use calcium. After menopause women need to discuss hormone treatment with their doctor.    * This is just a first step in helping improve your health.  * For help with a meal plan for you, make an appointment with the Nutritionist.  *   For more information, you can also contact the National Nutrition Hotline 1-800-366-1655                                                              9/95 MAS 1/96, 12/97eqj, 01/02, 4/02

## 2013-01-24 ENCOUNTER — Ambulatory Visit (HOSPITAL_BASED_OUTPATIENT_CLINIC_OR_DEPARTMENT_OTHER): Payer: Self-pay | Admitting: Internal Medicine

## 2013-01-24 DIAGNOSIS — Z1231 Encounter for screening mammogram for malignant neoplasm of breast: Principal | ICD-10-CM

## 2013-01-28 LAB — MA SCREENING MAMMO BILATERAL WITH CAD

## 2013-02-11 ENCOUNTER — Emergency Department (HOSPITAL_BASED_OUTPATIENT_CLINIC_OR_DEPARTMENT_OTHER)
Admission: RE | Admit: 2013-02-11 | Disposition: A | Discharge status: Home / Self Care | Payer: Self-pay | Source: Emergency Department | Attending: Emergency Medicine | Admitting: Emergency Medicine

## 2013-02-11 ENCOUNTER — Encounter (HOSPITAL_BASED_OUTPATIENT_CLINIC_OR_DEPARTMENT_OTHER): Payer: Self-pay

## 2013-02-11 HISTORY — DX: Pure hypercholesterolemia, unspecified: E78.00

## 2013-02-11 LAB — MANUAL WBC DIFFERENTIAL
ABSOLUTE NEUT COUNT MANUAL: 6.4 10*3/uL (ref 1.6–8.3)
ATYPICAL LYMPHOCYTES %: 6 % (ref 0–12)
BAND NEUTROPHILS %: 14 % — ABNORMAL HIGH (ref 0–8)
LYMPHOCYTES %: 8 % — ABNORMAL LOW (ref 15.0–54.0)
MONOCYTES %: 2 % — ABNORMAL LOW (ref 4.0–13.0)
PLATELET ESTIMATE: NORMAL
PLATELET MORPHOLOGY (ABNORMAL): NORMAL
POLYMORPHONUCLEAR (SEGS) %: 70 % (ref 40.0–75.0)

## 2013-02-11 LAB — URINALYSIS
BACTERIA: >50 PER HPF — AB (ref 0–5)
BILIRUBIN, URINE: NEGATIVE
CRYSTALS: NONE SEEN
GLUCOSE, URINE: NEGATIVE MG/DL
KETONE, URINE: NEGATIVE MG/DL
LEUKOCYTE ESTERASE: NEGATIVE
NITRITE, URINE: NEGATIVE
PH URINE: 6 (ref 5.0–8.0)
SPECIFIC GRAVITY URINE: 1.025 (ref 1.003–1.035)

## 2013-02-11 LAB — CBC, PLATELET RFLX MAN DIFF
HEMATOCRIT: 42.7 % (ref 34.1–44.9)
HEMOGLOBIN: 14.4 g/dL (ref 11.2–15.7)
MEAN CORP HGB CONC: 33.7 g/dL (ref 31.0–37.0)
MEAN CORPUSCULAR HGB: 31.8 pg (ref 26.0–34.0)
MEAN CORPUSCULAR VOL: 94.3 fL (ref 80.0–100.0)
MEAN PLATELET VOLUME: 12.3 fL (ref 8.7–12.5)
PLATELET COUNT: 170 10*3/uL (ref 150–400)
RBC DISTRIBUTION WIDTH STD DEV: 43.2 fL (ref 35.1–46.3)
RBC DISTRIBUTION WIDTH: 12.8 % (ref 11.5–14.3)
RED BLOOD CELL COUNT: 4.53 M/uL (ref 3.90–5.20)
WHITE BLOOD CELL COUNT: 7.7 10*3/uL (ref 4.0–11.0)

## 2013-02-11 LAB — COMPREHENSIVE METABOLIC PANEL
ALANINE AMINOTRANSFERASE: 43 U/L (ref 12–45)
ALBUMIN: 3.8 g/dL (ref 3.4–5.0)
ALKALINE PHOSPHATASE: 93 U/L (ref 45–117)
ANION GAP: 10 mmol/L (ref 5–15)
ASPARTATE AMINOTRANSFERASE: 24 U/L (ref 8–34)
BILIRUBIN TOTAL: 0.4 mg/dL (ref 0.2–1.0)
BUN (UREA NITROGEN): 11 mg/dL (ref 7–18)
CALCIUM: 8.5 mg/dL (ref 8.5–10.1)
CARBON DIOXIDE: 29 mmol/L (ref 21–32)
CHLORIDE: 102 mmol/L (ref 98–107)
CREATININE: 0.9 mg/dL (ref 0.4–1.2)
ESTIMATED GLOMERULAR FILT RATE: >60 mL/min (ref >60)
Glucose Random: 108 mg/dL (ref 74–160)
POTASSIUM: 3.4 mmol/L — ABNORMAL LOW (ref 3.5–5.1)
SODIUM: 141 mmol/L (ref 136–145)
TOTAL PROTEIN: 7.2 g/dL (ref 6.4–8.2)

## 2013-02-11 LAB — CT ABDOMEN & PELVIS W IV CONTRAST

## 2013-02-11 LAB — LIPASE: LIPASE: 115 U/L (ref 73–393)

## 2013-02-11 MED ORDER — IOHEXOL 300 MG/ML IJ SOLN
100.00 mL | Freq: Once | INTRAMUSCULAR | Status: AC
Start: 2013-02-11 — End: 2013-02-11
  Administered 2013-02-11: 100 mL via INTRAVENOUS

## 2013-02-11 MED ORDER — SODIUM CHLORIDE 0.9 % IV BOLUS
1000.00 mL | Freq: Once | INTRAVENOUS | Status: AC
Start: 2013-02-11 — End: 2013-02-11
  Administered 2013-02-11: 1000 mL via INTRAVENOUS

## 2013-02-11 MED ORDER — IOHEXOL 240 MG/ML IJ SOLN
525.00 mL | Freq: Once | INTRAMUSCULAR | Status: AC
Start: 2013-02-11 — End: 2013-02-11
  Administered 2013-02-11: 525 mL via ORAL

## 2013-02-11 MED ORDER — METOCLOPRAMIDE HCL 5 MG/ML IJ SOLN
10.00 mg | Freq: Once | INTRAMUSCULAR | Status: AC
Start: 2013-02-11 — End: 2013-02-11
  Administered 2013-02-11: 10 mg via INTRAVENOUS
  Filled 2013-02-11: qty 2

## 2013-02-11 MED ORDER — ONDANSETRON HCL 4 MG PO TABS
4.00 mg | ORAL_TABLET | Freq: Four times a day (QID) | ORAL | Status: AC | PRN
Start: 2013-02-11 — End: 2013-02-13

## 2013-02-11 MED ORDER — LOPERAMIDE HCL 2 MG PO CAPS
2.00 mg | ORAL_CAPSULE | Freq: Four times a day (QID) | ORAL | Status: AC | PRN
Start: 2013-02-11 — End: 2013-02-14

## 2013-02-11 MED ORDER — ONDANSETRON HCL 4 MG/2ML IJ SOLN
8.00 mg | Freq: Once | INTRAMUSCULAR | Status: AC
Start: 2013-02-11 — End: 2013-02-11
  Administered 2013-02-11: 8 mg via INTRAVENOUS
  Filled 2013-02-11: qty 4

## 2013-02-11 MED ORDER — NORMAL SALINE FLUSH 0.9 % IV SOLN
60.00 mL | Freq: Once | INTRAVENOUS | Status: AC
Start: 2013-02-11 — End: 2013-02-11
  Administered 2013-02-11: 60 mL via INTRAVENOUS

## 2013-02-11 MED ORDER — MORPHINE SULFATE (PF) 2 MG/ML IV SOLN
2.00 mg | Freq: Once | INTRAVENOUS | Status: AC
Start: 2013-02-11 — End: 2013-02-11
  Administered 2013-02-11: 2 mg via INTRAVENOUS
  Filled 2013-02-11: qty 1

## 2013-02-11 NOTE — ED Notes (Signed)
Pt vomited 4oo cc clear liquid. Reglan infusing. # 2 Ns infusing

## 2013-02-11 NOTE — ED Notes (Signed)
Pt has nausea, MD notified

## 2013-02-11 NOTE — ED Notes (Signed)
MD at bedside. 

## 2013-02-11 NOTE — ED Provider Notes (Signed)
The patient was seen primarily by me. ED nursing record was reviewed. Prior records as available electronically through the Epic record were reviewed.    Patient's mode of arrival was by Relative.  Arrival time: 02/11/2013 10:13 AM  Chief complaint: Diarrhea, Vomiting and Fever    I utilized a [place] interpreter for this encounter    HPI:    This 57 year old female patient presents to the emergency department with abdominal pain, vomiting, diarrhea, fever or.  She reports symptoms started on Sunday.  She reports that she "ate bad meat at a friend's house on Sunday".  She reports that the pain is diffuse in nature.  She denies any blood in her stool or vomit.  She reports a prior appendectomy and C-section.    ROS: Pertinent positives were reviewed as per the HPI above. All other systems were reviewed and are negative.    Past Medical History/Problem list:    Past Medical History    H/o Condyloma     Comment: 2003 (vulvar)    S/P appendectomy     S/P laparoscopic procedure     Comment: Exploratory    Presbyopia     Arthritis     Hyperopia     Right ear pain 06/17/2010    Comment: Seen by ENT. 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 No signs of infection on the right. Sent for hearing test.     Hypercholesteremia     HTN (hypertension)      Patient Active Problem List:     Multiple Breast Cysts     Menorrhagia     Hypertension     Hyperlipidemia LDL goal < 130     Hot flashes not due to menopause     Tendonitis     Sinusitis     DPH-CARE COORDINATION PROGRAM PARTICIPANT     Tension headache, chronic     Routine general medical examination at a health care facility     Atrial ectopy      Past Surgical History:     Past Surgical History    APPENDECTOMY  2000    Comment laparoscopy    ENDOMETRIAL BX W/WO ENDOCERVIX BX W/O DILAT SPX  9/04    Comment endometrial bx = negative    OB ANTEPARTUM CARE CESAREAN DLVR & POSTPARTUM      Comment tubal ligation    HYSTEROSCOPY BX ENDOMETRIUM&/POLYPC W/WO D&C      Comment 3 times  in [place]       Medications:   No current facility-administered medications on file prior to encounter.  Current Outpatient Prescriptions on File Prior to Encounter:  FLUoxetine (PROZAC) 20 MG tablet Take 1 tablet by mouth daily. Disp: 30 tablet Rfl: 11   amLODIPine (NORVASC) 5 MG tablet Take 1 tablet by mouth daily. Disp: 30 tablet Rfl: 11   simvastatin (ZOCOR) 20 MG tablet TAKE 1 TABLET BY MOUTH EVERY NIGHT Disp: 30 tablet Rfl: 11       Social History:   Social History   Marital Status: Married  Spouse Name: [name]    Years of Education: N/A  Number of Children: 4     Occupational History  Babysitting  Works daytime     Social History Main Topics   Smoking status: Never Smoker     Smokeless tobacco: Not on file    Alcohol Use: Yes    Comment: Occasionally    Drug Use: No    Sexually Active: Yes  Partner(s):  Female    Birth Control/ Protection: Surgical     Other Topics Concern   None on file     Social History Narrative    Immigrated from [place] in 2003       Allergies:  Review of Patient's Allergies indicates:   Pravachol               Nausea Only, Other (See Comments)    Comment:Malaise      Physical Exam:  Patient Vitals for the past 24 hrs:   BP Temp Pulse Resp SpO2 Weight   02/11/13 1201 134/71 mmHg - 105 - 99 % -   02/11/13 1146 127/72 mmHg - 94 - 98 % -   02/11/13 1131 120/73 mmHg - 99 - 99 % -   02/11/13 1116 122/73 mmHg - 99 - 97 % -   02/11/13 1112 125/73 mmHg - 89 - 99 % -   02/11/13 1026 125/64 mmHg 98.9 F 104 18 99 % 66.679 kg       GENERAL:  Well appearing, no acute distress, non-toxic   SKIN:  No rash, no petechia.  HEAD:  NCAT. Oropharynx is clear with moist mucous membranes. PERRL. EOMI.  NECK:  No C-spine tenderness, crepitus.  No meningismus. No stridor. Full ROM without difficulty  LUNGS:  Clear to auscultation bilaterally. No wheezes, rales, rhonchi.   HEART:  RRR.   ABDOMEN:  Soft, tender to palpation in left lower quadrant. No involuntary guarding or rebound.  No peritoneal  signs.  EXTREMITIES:  No obvious deformities.  Warm and well perfused.   NEUROLOGIC:  Alert and oriented x4; moves all extremities well; speaking in clear fluent sentences. Normal gait without ataxia; nonfocal.   PSYCHIATRIC:  Appropriate for age, time of day, and situation    ED Course and Medical Decision-making:  57 year old female patient presents with abdominal pain, fever, nausea/vomiting, diarrhea since Sunday.  Plan for labs, CT abdomen and pelvis to evaluate for diverticulitis given her tenderness on exam, IV fluid and symptomatic medications, reevaluation and reexamination.    Reevaluation: No leukocytosis, she has no urinary symptoms and therefore her urinalysis was not consistent with urinary tract infection.  Her CT shows no evidence of diverticulitis but does show evidence of enterocolitis which fits with her history.  She feels much improved after the medications and IV fluid.  She wants to be discharged home.  I will give her prescriptions to improve her symptoms and had a long discussion with her with the translator regarding diet, need for hydration and followup with her primary care physician in 2 days for reexamination reevaluation to assess her clinical improvement.    I gave the patient explicit return precautions which included increased pain, different pain, pain that doesn't resolve, fevers, persistent nausea or vomiting.  They express understanding of these return precautions.    The patient agrees with this plan and disposition.  Patient had an opportunity to ask questions which were answered.     Condition on Discharge: Improved and Stable    Diagnosis/Diagnoses:  Nausea and vomiting  Diarrhea    Temple Pacini MD    This Emergency Department patient encounter note was created using voice-recognition software and in real time during the ED visit. Please excuse any typographical errors that have not yet been reviewed and corrected.

## 2013-02-11 NOTE — ED Notes (Signed)
Patient Disposition    Patient education for diagnosis, medications, activity, diet and follow-up with PCP  Patient left ED 3:17 PM.  Patient  received written instructions.Pt verbalizes understanding  Interpreter to provide instructions: Yes/ telephone    Discharged to: Discharged to home. Pt has a steady gait.

## 2013-02-11 NOTE — ED Notes (Signed)
Pt back from CT

## 2013-02-11 NOTE — ED Notes (Signed)
Reglan infusing no adverse reaction

## 2013-02-11 NOTE — ED Notes (Signed)
Pt informed await CT results. Pt up to bathroom

## 2013-02-11 NOTE — ED Notes (Signed)
Pt last had diarrhea this and last vomited last PM

## 2013-02-11 NOTE — ED Notes (Signed)
Pt taking second dose of oral contrast

## 2013-02-11 NOTE — ED Triage Note (Signed)
Pt has had a fever , vomiting and diarrhea since Sunday. Pt has had body aches and headache.

## 2013-02-11 NOTE — ED Notes (Addendum)
Pt up to bathroom. No diarrhea.

## 2013-02-11 NOTE — ED Notes (Signed)
Pt has # 1 NS infusing and taking oral contrast

## 2013-02-11 NOTE — ED Notes (Signed)
Lab and Ct results discussed with pt by telephone interpreter . Pt verbalizes understanding

## 2013-02-11 NOTE — Discharge Instructions (Signed)
Return for increased pain, different pain, pain that does not resolve, fevers, repetitive nausea or vomiting, any other concerns.      Please followup with your primary care physician in 2-3 days for reexamination reevaluation to assess for clinical improvement.

## 2013-02-11 NOTE — ED Notes (Signed)
Full instructions any increased pain, increased fever, increased vomiting or any concerns to ED

## 2013-02-13 ENCOUNTER — Telehealth (HOSPITAL_BASED_OUTPATIENT_CLINIC_OR_DEPARTMENT_OTHER): Payer: Self-pay | Admitting: Ambulatory Care

## 2013-02-13 NOTE — Telephone Encounter (Signed)
Message copied by Audrie Gallus on Thu Feb 13, 2013  9:15 AM  ------       Message from: Riccardo Dubin       Created: Thu Feb 13, 2013  9:10 AM       Regarding: Sick call       Contact: [phone]         Tina [name] is a 57 year old old female.              Patient's PCP: Carolyn [place], APRN              In case we get disconnected what is the best number to reach you at today:       Home Phone [phone] (home)              Person calling:       Patient (self)              How can I help you today:        Patient said that she went to Ed on 09/30, is still not feeling well, headache, fever. Wants to be seen today              Patient's language of care: [place]              Would you like an interpreter when the nurse calls you back?       Yes, [place]  ------

## 2013-02-13 NOTE — Progress Notes (Addendum)
02/11/13 went to ED for nausea , vomiting , fever and diarrhea .  Was a little better yesterday , drinking clear fluids including Pediolyte .  This morning vomited again , husband stated she had fever was very warm at 3 A.m .   A little dizzy as well .     No availability this A.M . Advised if wife has dizziness should return to ED .   States wants wife to be seen tomorrow morning , if worse will bring her to ED .   Appointment made . Told patient if they are in any acute distress , please go to the Emergency room for evaluation . Husband and wife agree .         Reviewed stomach upset/nausea/vomiting home care instructions with pt as below:  -do not eat/drink anything for 1 hr after last emesis.  -drink sips of clear fluids for first 12 hrs, including gelatin, water, sports drinks, flat soda, clear broth, flavored ice, do not drink citrus juice.  -increase fluids as tolerated.  -after 12 hrs, try small amounts bland foods, such as rice, potatoes, soda crackers, pretzels, dry toast, and applesauce.  -after bland food is tolerated, resume normal diet as tolerated.  -retake medications if vomiting occurs within 30 minutes of taking usual medication.  -avoid milk, citrus foods & juices, and spicy or fatty foods.  -take otc dramamine for nausea if oked by MD.  -if from virus, viruses causing nausea and vomiting are easily spread.  Pay special attention to handwashing.  Avoid using towels, tableware, and cups used by infected person.  -decrease/stop smoking.  -Apply heat(moist towel or heating pad) to abdomen for cramping    Report to [facility]:  Fever, weakness, or abdominal pain for >2hrs.  -no improvement in 48 hrs or condition worsens.  -signs of dehydration.  -Bloody or black stools or emesis    Seek emergent care if:  -Decreased LOC.  -vomiting blood or coffee-grounds like emesis.  -Unusually firm or hard abdomen  -Persistent vomiting  -Severe persistent pain  -Fainting/lightheadedness  -Pt agrees to try  above recommendations.m  To provider for review .

## 2013-02-14 ENCOUNTER — Ambulatory Visit (HOSPITAL_BASED_OUTPATIENT_CLINIC_OR_DEPARTMENT_OTHER): Payer: PRIVATE HEALTH INSURANCE | Admitting: Internal Medicine

## 2013-02-14 VITALS — BP 104/62 | HR 84 | Temp 100.0°F | Wt 146.4 lb

## 2013-02-14 DIAGNOSIS — K529 Noninfective gastroenteritis and colitis, unspecified: Secondary | ICD-10-CM

## 2013-02-14 MED ORDER — PROCHLORPERAZINE MALEATE 10 MG PO TABS
10.0000 mg | ORAL_TABLET | Freq: Four times a day (QID) | ORAL | Status: DC | PRN
Start: 2013-02-14 — End: 2014-01-13

## 2013-02-14 NOTE — Patient Instructions (Signed)
For solids- start slowly with bananas, rice, applesauce, toast.

## 2013-02-14 NOTE — Progress Notes (Signed)
Pt of C Tina [name] here after ED visit 9/30 for abd pain, diarrhea.  Reviewed ED note, labs and CT today. CT was done there with no diverticulitis but did have enterocolitis.  Pt better with IVF.  She did not have leukocytosis there but she did have a bandemia    Sx started on "Sunday night. Pt states she still has vomiting. No diarrhea today but had diarrhea yesterday x 4-5 episodes.     Has sensation of feeling like she might pass out.  Not eating anything but is drinking pedialyte and water.  Urine is normal in color- no dysuria.  Vomited today once.      + HA  No blood in stools or black tarry stools.  No blood  In vomit.    + aBD PAIN- 3-4/10.      Taking ondansetron for nausea-  Doesn't think this helped her.   Is taking something for nausea from [place] which helped her.   Imodium did help because she didn't have a BM today     Taking Ibuprofen 800mg    Temp yesterday was 37.5 and today was 37.5 as well. Yesterday night was 38.    Diarrhea is better but that's the only thing that feels better.  When she walks, she feels she may pass out.     Husband brought her in today and is taking care of her at home     meds and allergies reviewed and updated  in epic  Patient Active Problem List:     Multiple Breast Cysts     Menorrhagia     Hypertension     Hyperlipidemia LDL goal < 130     Hot flashes not due to menopause     Tendonitis     Sinusitis     DPH-CARE COORDINATION PROGRAM PARTICIPANT     Tension headache, chronic     Routine general medical examination at a health care facility     Atrial ectopy    Physical Exam   Vitals reviewed.  Constitutional: Pt appears well-developed and well-nourished. No distress.   Eyes: Right eye exhibits no discharge. Left eye exhibits no discharge. No scleral icterus.   Pulmonary/Chest: Effort normal. No respiratory distress.   Abd- + hyperactive bowel sounds. Mild Tenderness to deep palpation in R and L lower abd. No rebound or guarding.   Neurological: Pt is alert and  oriented to person, place, and time.   Skin: Skin is warm and dry. Pt is not diaphoretic.   Psychiatric: Pt has a normal mood and affect.  behavior is normal. Thought content normal.     57"  yo F here for f/u diarrheal illness for which she was recently seen in the ED.     (558.9) Enterocolitis  (primary encounter diagnosis)  Comment:CT did not show diverticulitis and exam is relatively benign with only 4/10 pain. I think an abscess or diverticulitis which would require abx are unlikely. Rather, I think she likely still has a viral enterocolitis which needs to be treated with supportive care.    c/o dizziness but states she feels steady on her feet and would like to go home under the care of her husband. States diarrhea is improving since taking imodium from ED. VSS with BP 104/62 (low side for her so I asked her to hold her amlodipine today only) and HR 84 indicating minimal dehydration at present- she is drinking well per her report. We discussed possibility of her going to ED for IVF  now vs going home under care of her husband. Pt prefers the later  Plan: prochlorperazine (COMPAZINE) 10 MG tablet        - discussed warning signs for which should go to ED- T >101 in 1-2 days, blood in stool or vomit, worsening abd pain, can't stand up 2/2 dizziness or worsening dizziness.   - rec push fluids- watered down gatorade, broth, tea.    - can hold amlodipine today- BP is on the low side today   - rec fluids 3 L/day so that urine is clear.   - BRAT diet when vomiting has stopped.     RTC PRN  All of the pt's questions were answered today. Pt in agreement with the above plan and agrees to call us with any further questions, concerns, worstening of sx or new sx of concern.     We discussed new and  current medications and the importance of medication compliance. The patient was ready to learn and no apparent learning or adherence barriers were identified. I explained the diagnosis and treatment plan, and the patient  expressed understanding of the content.     I attempted to answer any questions regarding the diagnosis and the proposed treatment.

## 2013-03-06 ENCOUNTER — Ambulatory Visit (HOSPITAL_BASED_OUTPATIENT_CLINIC_OR_DEPARTMENT_OTHER): Payer: PRIVATE HEALTH INSURANCE | Admitting: Primary Care

## 2013-03-13 ENCOUNTER — Ambulatory Visit (HOSPITAL_BASED_OUTPATIENT_CLINIC_OR_DEPARTMENT_OTHER): Payer: Self-pay | Admitting: Internal Medicine

## 2013-03-13 ENCOUNTER — Ambulatory Visit (HOSPITAL_BASED_OUTPATIENT_CLINIC_OR_DEPARTMENT_OTHER): Payer: PRIVATE HEALTH INSURANCE | Admitting: Registered Nurse

## 2013-03-13 DIAGNOSIS — Z23 Encounter for immunization: Principal | ICD-10-CM

## 2013-03-13 LAB — MA DIAGNOSTIC MAMMO BILATERAL WITH CAD

## 2013-03-13 NOTE — Progress Notes (Signed)
03/13/2013  Flu Vaccine   Confirmed patient's name and date of birth.  Pt denies allergies to this vaccine.   Pt denies allergies to egg or egg products.  Pt denies allergy to contact lens solution.   Pt denies adverse effects from previous administration of this medication. Pt denies history of Guillain-Barre' syndrome.  Pt denies moderate/severe illness at this time.  Risks and benefits of Flu Vaccine reviewed with pt. VIS for Flu Vaccine  offered and reviewed with pt.    Vaccine tolerated well by patient. Patient denies adverse effects from injection at this time. Patient encouraged to utilize arm and not favor it.  Patient informed may  take pain reliever of choice and to apply ice for discomfort if necessary.  Patient will call with any questions or concerns.  Please refer to Imm./Inj. section for administration site, lot # and exp. date.    Cammie Mcgee, LPN

## 2013-06-09 ENCOUNTER — Other Ambulatory Visit (HOSPITAL_BASED_OUTPATIENT_CLINIC_OR_DEPARTMENT_OTHER): Payer: Self-pay | Admitting: Internal Medicine

## 2013-06-09 DIAGNOSIS — I10 Essential (primary) hypertension: Secondary | ICD-10-CM

## 2013-06-09 MED ORDER — AMLODIPINE BESYLATE 5 MG PO TABS
5.0000 mg | ORAL_TABLET | Freq: Every day | ORAL | Status: DC
Start: 2013-06-09 — End: 2014-06-05

## 2013-06-09 NOTE — Progress Notes (Signed)
[facility] INTERNAL MED    Person calling on behalf of patient: Patient (self)    May list multiple medications in this section    Medicine Name: amLODIPine (NORVASC) 5 MG tablet      Dosage:       Frequency (how many pills, how many times a day):       Number of pills left:       Documented patient preferred pharmacies:   [place] ([facility])  Phone: [phone] Fax: [phone]      Pharmacy Name:       Pharmacy Telephone Number:       Pharmacy  Fax Number:       CALL BACK NUMBER:       Cell phone:      Other phone:       Available times:       Patient's language of care: [place]    Patient needs a [name] interpreter.

## 2013-06-09 NOTE — Progress Notes (Addendum)
Hoyle Sauer- can you send to [facility] or Sam to call pt when ordered? Thanks  Tina [name] is a 58 year old female calling to request a refill of norvasc .  HTN Med:    Most Recent BP Reading(s)  02/14/13 : 104/62  02/11/13 : 152/88  01/10/13 : 122/70    Documented patient preferred pharmacies:    [place] ([facility])  Phone: [phone] Fax: [phone]

## 2013-09-28 ENCOUNTER — Emergency Department (HOSPITAL_BASED_OUTPATIENT_CLINIC_OR_DEPARTMENT_OTHER)
Admission: RE | Admit: 2013-09-28 | Disposition: A | Discharge status: Home / Self Care | Payer: Self-pay | Source: Emergency Department | Attending: Emergency Medicine | Admitting: Emergency Medicine

## 2013-09-28 ENCOUNTER — Encounter (HOSPITAL_BASED_OUTPATIENT_CLINIC_OR_DEPARTMENT_OTHER): Payer: Self-pay

## 2013-09-28 NOTE — Discharge Instructions (Signed)
Emergency Room Visit    You were seen in the [facility] Emergency Room for blue colored patch on your lateral right ankle. This came off with wiping from an alcohol swab. It was likely a dye or stain from clothing or something else!

## 2013-09-28 NOTE — ED Triage Note (Signed)
Pt notes a bruise on her right ankle and left hand , no trauma

## 2013-09-28 NOTE — ED Notes (Signed)
Patient Disposition    Patient education for diagnosis, medications, activity, diet and follow-up.  Patient left ED 7:30 PM.  Patient rep received written instructions.  Interpreter to provide instructions: No    Have all existing LDAs been addressed? N/A    Have all IV infusions been stopped? N/A    Discharged to: Discharged to home

## 2013-09-28 NOTE — ED Provider Notes (Signed)
ED nursing record was reviewed. Prior records as available electronically through the Epic record were reviewed.    Patient was seen along with Dr. Ruthann Cancer and management was discussed with them.    Patient is Portuguese-speaking and interview, exam, and final disposition were conducted via an official hospital interpreter.    HPI:    This 58 year old female patient presents to the Emergency Department with chief complaint of blue patch on her right lateral ankle that happened 2 and a half hours ago. She also had one prior to arrival on her left ventral wrist that since disappeared. She was inside all day, and did not notice touching anything blue, wearing anything blue, being around blue dye or detergent. She tried washing both of these patches off with soap and water but reportedly they did not come off. It does not itch nor cause pain. Denies any other symptoms include fevers/chills, nausea/vomiting, abd pain, chest pain, palpitations etc. Feeling usual state of health. Has not been outside today.      ROS: Pertinent positives were reviewed as per the HPI above. All other systems were reviewed and are negative.      Past Medical History/Problem list:    Past Medical History    H/o Condyloma     Comment: 2003 (vulvar)    S/P appendectomy     S/P laparoscopic procedure     Comment: Exploratory    Presbyopia     Arthritis     Hyperopia     Right ear pain 06/17/2010    Comment: Seen by ENT. 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 No signs of infection on the right. Sent for hearing test.     Hypercholesteremia     HTN (hypertension)      Patient Active Problem List:     Multiple Breast Cysts     Menorrhagia     Hypertension     Hyperlipidemia LDL goal < 130     Hot flashes not due to menopause     Tendonitis     Sinusitis     DPH-CARE COORDINATION PROGRAM PARTICIPANT     Tension headache, chronic     Routine general medical examination at a health care facility     Atrial ectopy        Past Surgical History:      Past Surgical History    APPENDECTOMY  2000    Comment laparoscopy    ENDOMETRIAL BX W/WO ENDOCERVIX BX W/O DILAT SPX  9/04    Comment endometrial bx = negative    OB ANTEPARTUM CARE CESAREAN DLVR & POSTPARTUM      Comment tubal ligation    HYSTEROSCOPY BX ENDOMETRIUM&/POLYPC W/WO D&C      Comment 3 times in [place]         Medications:   Takes Prozac and amlodipine    Social History:   Social History   Marital Status: Married  Spouse Name: [name]    Years of Education: N/A  Number of Children: 20     Occupational History  Babysitting  Works daytime     Social History Main Topics   Smoking status: Never Smoker     Smokeless tobacco: Not on file    Alcohol Use: Yes    Comment: Occasionally    Drug Use: No    Sexual Activity: Yes    Partners: Male    Patent examiner Protection: Surgical     Other Topics Concern   None on  file     Social History Narrative    Immigrated from [place] in 2003         Allergies:  Review of Patient's Allergies indicates:   Pravachol               Nausea Only, Other (See Comments)    Comment:Malaise      Physical Exam:  BP 115/75  Pulse 92  Temp(Src) 98.3 F  Wt 63.05 kg    GENERAL: Pleasant, laughing, well-appearing, no acute distress, non-toxic.   SKIN:  Rectangular patch of light blue on lateral right ankle, non-raised, nontender -- came off with alcohol swab  HEAD:  NCAT. EOMI. Oropharynx is clear with MMM.  NECK:  Supple  LUNGS:  Clear to auscultation bilaterally. No wheezes, crackles, rhonchi.   HEART:  RRR. S1S2 normal. No murmurs, rubs, or gallops.   ABDOMEN:  Soft, NT/ND.   EXTREMITIES:  No obvious deformities. See above.  NEUROLOGIC:  Alert and oriented; moves all extremities well; speaking in clear fluent sentences.  PSYCHIATRIC:  Appropriate for age, time of day, and situation.    RESULTS  No results found for this visit on 09/28/13 (from the past 24 hour(s)).        ED Course and Medical Decision-making:  This 58 year old female patient presents with a bluish patch on her  ankle that comes off completely with alcohol wipes. It was likely dye or ink. Patient feels silly that she came to the emergency room for this.      Condition: Improved, stable  Disposition: Home      Diagnosis/Diagnoses:  Blue color skin  (primary encounter diagnosis)    Rae Lips, MD PGY-1

## 2013-09-30 ENCOUNTER — Encounter (HOSPITAL_BASED_OUTPATIENT_CLINIC_OR_DEPARTMENT_OTHER): Payer: Self-pay

## 2014-01-13 ENCOUNTER — Ambulatory Visit (HOSPITAL_BASED_OUTPATIENT_CLINIC_OR_DEPARTMENT_OTHER): Payer: PRIVATE HEALTH INSURANCE | Admitting: Internal Medicine

## 2014-01-13 ENCOUNTER — Encounter (HOSPITAL_BASED_OUTPATIENT_CLINIC_OR_DEPARTMENT_OTHER): Payer: Self-pay | Admitting: Internal Medicine

## 2014-01-13 VITALS — BP 134/84 | HR 81 | Temp 97.9°F | Wt 153.4 lb

## 2014-01-13 DIAGNOSIS — R232 Flushing: Secondary | ICD-10-CM

## 2014-01-13 DIAGNOSIS — N920 Excessive and frequent menstruation with regular cycle: Secondary | ICD-10-CM

## 2014-01-13 DIAGNOSIS — Z1211 Encounter for screening for malignant neoplasm of colon: Secondary | ICD-10-CM

## 2014-01-13 DIAGNOSIS — R635 Abnormal weight gain: Secondary | ICD-10-CM

## 2014-01-13 DIAGNOSIS — Z23 Encounter for immunization: Secondary | ICD-10-CM

## 2014-01-13 DIAGNOSIS — Z0000 Encounter for general adult medical examination without abnormal findings: Principal | ICD-10-CM

## 2014-01-13 DIAGNOSIS — E785 Hyperlipidemia, unspecified: Secondary | ICD-10-CM

## 2014-01-13 DIAGNOSIS — Z1212 Encounter for screening for malignant neoplasm of rectum: Secondary | ICD-10-CM

## 2014-01-13 MED ORDER — FLUOXETINE HCL 20 MG PO TABS
20.0000 mg | ORAL_TABLET | Freq: Every day | ORAL | Status: DC
Start: 2014-01-13 — End: 2015-02-04

## 2014-01-13 NOTE — Patient Instructions (Signed)
[facility]  [facility]  [address]  [address]  [place] [address]  Clinical Nutrition Services    Calcium and Your Health    Calcium is a mineral that helps build strong bones and teeth, helps prevent brittle bones (osteoporosis) and hip fractures, and helps maintain a normal blood pressure. It is very important to get enough calcium each day.    * CALCIUM NEEDS AT ALL AGES: (RDI 2000)  Infants birth to 12 months  is 210 -270 mg/day          Child 1-8 years  is 500-.800 mg/day                     Adolescent 9-18years is 1300 mg/day                   Adults 19-30 years is 1000 mg/day  Adults 31-50 years is 1000 mg/day  Adults 51 and older is 1200 mg/day    * TO MAINTAIN GOOD BONE HEALTH, EAT A CALCIUM RICH DIET:    FOODS                                      Yogurt, plain, nonfat: 1 cup serving is 450 mgs of calcium    Ricotta cheese, part skim: 1/2 cup serving is 340 mgs of calcium    Milk, skim: 1 cup serving is 300 mgs of calcium    Milk, whole: 1 cup serving is 290 mgs of calcium    Sardines, canned, with bones: 3 oz serving is 280 mgs of calcium    Yogurt, plain, whole milk:1 cup serving is 275 mgs of calcium    Orange juice, calcium fortified:1 cup serving is 270 mgs of calcium    Swiss cheese: 1 oz serving is 270 mgs of calcium    Spinach, cooked: 1 cup serving is 240 mgs of calcium   Turnip greens, rhubarb, cooked:1 cup serving is 200 mgs of calcium   Salmon, canned, with bones: 3 oz serving is 180 mgs of calcium   Ice Cream: 1 cup serving is 175 mgs of calcium   Pudding, instant mix: 1/2 cup serving is 150 mgs of calcium   Almonds:  /2 cup serving is 150 mgs of calcium   Kale, cooked: 1 cup serving is 100 mgs of calcium   Lobster, cooked: 6 oz serving is 100 mgs of calcium   Tofu, with calcium sulfate:  3oz serving is 100 mgs of calcium     EXERCISE: Weight bearing exercises, such as walking, jogging, racquet sports, aerobics, and weight training   help make bones stronger.     VITAMIN D: Vitamin D is essential to help your body absorb calcium. A healthy body can make its own vitamin D with the help of sunlight. But in the winter months, you may not get enough sun, and should make   sure you get it from another source, like milk or a multivitamin.     HORMONES: The hormone estrogen helps the female body and bones to use calcium. After menopause women need to discuss hormone treatment with their doctor.    * This is just a first step in helping improve your health.  * For help with a meal plan for you, make an appointment with the Nutritionist.  * For more information, you can   also contact the National Nutrition Hotline 1-800-366-1655                                                              9/95 MAS 1/96, 12/97eqj, 01/02, 4/02

## 2014-01-13 NOTE — Progress Notes (Signed)
Chief Complaint:  Tina [name] is a 58 year old female who presents for a physical exam.     1. Hx of menorrhagia, had a [place] IUD placed 12/2008. Reports being asymptomatic of heavy vaginal bleeding since [place] IUD has been place  Desires to have a new IUD. Denies postmenopausal bleeding    2. Weight gain, voices concerns of recent weight gain. Has gained 14 lbs since last visit. Denies change in diet and exercise.   Also reports having dry hair and thinning of her nails    3. Would like to have her Lipids checked. Last lipid panel done 12/2012 was WNL. Stopped taking Zocor 20 mg daily for the past year stating " I was told I did not need to take it anymore"    Patient Active Problem List:     Multiple Breast Cysts     Menorrhagia     Hypertension     Hyperlipidemia LDL goal < 130     Hot flashes not due to menopause     Tendonitis     Sinusitis     DPH-CARE COORDINATION PROGRAM PARTICIPANT     Tension headache, chronic     Routine general medical examination at a health care facility     Atrial ectopy        Current Outpatient Prescriptions:  FLUoxetine (PROZAC) 20 MG tablet Take 1 tablet by mouth daily. Disp: 30 tablet Rfl: 11   amLODIPine (NORVASC) 5 MG tablet Take 1 tablet by mouth daily. Disp: 30 tablet Rfl: 11   prochlorperazine (COMPAZINE) 10 MG tablet Take 1 tablet by mouth every 6 (six) hours as needed. Disp: 15 tablet Rfl: 0   simvastatin (ZOCOR) 20 MG tablet TAKE 1 TABLET BY MOUTH EVERY NIGHT Disp: 30 tablet Rfl: 11     No current facility-administered medications for this visit.    Allergies:  Review of Patient's Allergies indicates:   Pravachol               Nausea Only, Other (See Comments)    Comment:Malaise    Health Maintenance:  AWQ Questionnaire due on 12/27/1975  FECAL OCCULT BLOOD AGE 3+ due on 10/06/2012  TETANUS (16 AND OVER) due on 04/19/2013  PHYSICAL EXAM (AGE 3+) due on 01/10/2014  FLU VACCINE SEASONAL due on 01/13/2014  MAMMOGRAPHY due on 03/14/2015  HEALTH CARE PROXY due on  06/07/2016  PAP SMEAR due on 09/27/2016  HPV SCREENING due on 09/27/2016  LIPID SCREENING due on 01/10/2018  HIV SCREENING Completed  HEP B HIGH RISK VACCINE EVAL (ONCE) Completed    Immunizations:  Immunization History   Administered Date(s) Administered   . Depo Provera 01/02/2005, 04/24/2008, 07/10/2008, 09/04/2008, 10/16/2008, 11/13/2008   . Flu Vaccine > 3 Yrs 03/19/2009, 01/28/2010, 03/22/2011, 03/12/2012   . H1N1 0.33ml W/ Preserv 3 & > 07/10/2008   . Influenza Virus Quadrivalent Vacc 3/> Yrs Im 03/13/2013   . Td 04/20/2003       Histories:    Past Medical History    H/o Condyloma     Comment: 2003 (vulvar)    S/P appendectomy     S/P laparoscopic procedure     Comment: Exploratory    Presbyopia     Arthritis     Hyperopia     Right ear pain 06/17/2010    Comment: Seen by ENT. 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 No signs of infection on the right. Sent for hearing test.     Hypercholesteremia  HTN (hypertension)          Past Surgical History    APPENDECTOMY  2000    Comment laparoscopy    ENDOMETRIAL BX W/WO ENDOCERVIX BX W/O DILAT SPX  9/04    Comment endometrial bx = negative    OB ANTEPARTUM CARE CESAREAN DLVR & POSTPARTUM      Comment tubal ligation    HYSTEROSCOPY BX ENDOMETRIUM&/POLYPC W/WO D&C      Comment 3 times in [place]       Social History   Marital Status: Married  Spouse Name: [name]    Years of Education: N/A  Number of Children: 42     Occupational History  Babysitting  Works daytime     Social History Main Topics   Smoking status: Never Smoker     Smokeless tobacco: Not on file    Alcohol Use: Yes    Comment: Occasionally    Drug Use: No    Sexual Activity: Yes    Partners: Male    Patent examiner Protection: Surgical     Other Topics Concern   None on file     Social History Narrative    Immigrated from [place] in 2003       Family History    OTHER Mother     Comment: HTN, Heart problems    DES Exp Father     Comment: Died during heart catheterization at age 68    Glaucoma Father      Macular Degeneration Father        Review of Systems:                   Skin: negative  Eyes: negative  Ears/Nose/Throat: negative  Respiratory: negative  Cardiovascular: negative  Gastrointestinal: negative  Genitourinary: negative  MHD:QQIWLN menses, no abnormal bleeding, pelvic pain or discharge  no breast pain or new or enlarging lumps on self exam  Musculoskeletal: negative  Neurologic: negative  Endocrine: negative  Psychiatric: negative  Hematologic/Lymphatic/Immunologic: negative    Physical:  BP 134/84  Pulse 81  Temp(Src) 97.9 F (36.6 C) (Oral)  Wt 69.582 kg (153 lb 6.4 oz)  SpO2 97%  General appearance: healthy, alert, well developed, well nourished  Eyes: conjunctivae/corneas clear. PERRL, EOM's intact. Fundi benign  Skin: skin color, texture, turgor are normal  Head: Normocephalic. No masses, lesions, tenderness or abnormalities  Ears: External ears normal. Canals clear. TM's normal.  Nose/Sinuses: Nares normal. Septum midline. Mucosa normal. No drainage or sinus tenderness.  Oropharynx: Lips, mucosa, and tongue normal. Teeth and gums normal. Oropharynx moist and without lesion  Neck: Neck supple. No adenopathy. Thyroid symmetric, normal size, and without nodularity  Back: Back symmetric, no curvature. ROM normal. No CVA tenderness.  Lungs: Percussion normal. Good diaphragmatic excursion. Lungs clear to auscultation bilaterally  Heart: PMI normal. No lifts, heaves, or thrills. RRR. No murmurs, clicks, gallops or rubs  Abdomen: Abdomen soft, non-tender. BS normal. No masses, no organomegaly  Extremities: Extremities normal. No deformities, edema, or skin discoloration  Musculoskeletal: Spine ROM normal. Muscular strength intact.  Peripheral pulses: radial=4/4, femoral=4/4, popliteal=4/4, dorsalis pedis=4/4  Neuro: Gait normal. Reflexes normal and symmetric. Sensation grossly normal     Health Counseling:  Smoking:  Reviewed and Discussed  Substance Use Issues:   Reviewed and Discussed   Seat Belts:   Reviewed and Discussed  Protective Sports Equipment:  Reviewed and Discussed  Smoke Dectectors:  Reviewed and Discussed  Diet, Exercise, Wt. Control:  Reviewed and Discussed  Dental Health:  Reviewed and Discussed  Vision Health:  Reviewed and Discussed  Mental Health:  Reviewed and Discussed  Domestic Violence:  Reviewed and Discussed  Sexual Health:  Reviewed and Discussed  BSE:  Reviewed and Discussed  Osteoperosis: Reviewed and Discussed  Health Care Proxy:  Reviewed and Discussed      ASSESSMENT/PLAN:  (V70.0) Routine general medical examination at a health care facility  (primary encounter diagnosis)  Comment: normal physical exam  To f/u in 1 year for routine physical exam  Plan:     (782.62) Hot flashes not due to menopause  Comment: med refill given  Has been asymptomatic of postmenopausal hot flashes since use of Fluoxetine   Plan: FLUoxetine (PROZAC) 20 MG tablet          (272.4) Hyperlipidemia  Comment: will come back of fasting labs  I have reviewed chart, I do not see any consultation with pt in regards to stopping her Statin  If lipid panel is not at goal, will restart Zocor   Plan: LIPID PANEL, COLLECTION VENOUS BLOOD         VENIPUNCTURE            (V76.51) Screening for colorectal cancer  Comment: per HM  Plan: POC IMMUNOASSAY FECAL OCCULT BLOOD TEST            (783.1) Weight gain  Comment:   Plan: REFERRAL TO NUTRITION ( INT) NON-DIABETES,         THYROID SCREEN TSH REFLEX FT4            (626.2) Excessive or frequent menstruation  Comment: desires new IUD  Referral to GYN placed for removal and placement of new [place] IUD  Plan: REFERRAL TO GYNECOLOGY (INT)          (V06.1) Need for Tdap vaccination  Comment: is due for Tdap  Plan: TDAP VACCINE 7 AND OLDER IM (PUBLIC),         IMMUNIZATION ADMIN SINGLE, RN

## 2014-01-13 NOTE — Progress Notes (Signed)
Patient to receive TDAP today per MD order.  Confirmed patient's name and date of birth.  Patient denies allergy to this vaccine including DTP, DTap, DT or Td. Patient denies latex allergy. Patient denies epilepsy or Guillain Barre Syndrome.   Patient denies adverse effects from previous administration of this vaccine. Risks and benefits discussed. VIS offered, reviewed and accepted by patient. Questions answered to patient's satisfaction.  Injection tolerated without difficulty. Patient denies adverse effects from injection at this time.   Patient encouraged to utilize arm of injection and not to favor it. Patient encourage to take OTC pain reliever of choice and to apply ice for discomfort if necessary.   Patient is aware to call clinic with any questions or concerns.   Please refer to IMM / INJ section for administration site, lot number, expiration date and manufacturer.    Jana Hill Mackie, LPN

## 2014-01-15 ENCOUNTER — Ambulatory Visit (HOSPITAL_BASED_OUTPATIENT_CLINIC_OR_DEPARTMENT_OTHER): Payer: PRIVATE HEALTH INSURANCE | Admitting: Registered"

## 2014-01-15 ENCOUNTER — Encounter (HOSPITAL_BASED_OUTPATIENT_CLINIC_OR_DEPARTMENT_OTHER): Payer: Self-pay | Admitting: Registered"

## 2014-01-15 VITALS — BP 126/80 | HR 83 | Temp 97.6°F | Wt 153.0 lb

## 2014-01-15 DIAGNOSIS — E663 Overweight: Secondary | ICD-10-CM

## 2014-01-15 DIAGNOSIS — R635 Abnormal weight gain: Principal | ICD-10-CM

## 2014-01-15 NOTE — Patient Instructions (Signed)
Calories are the energy in food. Your body uses calories to eat, think, breath, move, etc. If you eat more calories than you use in a day, the body will store it as body fat. If you eat less calories than you use in a day, the body will use the stored body fat, and you lose weight.     To keep your weight the same: 1400 calories per day  To lose weight: 1000-1200 calories per day + walking or strength training    You can track your calories for free using smart phone or website:   www.loseit.com OR LatinCafes.be OR www.calorieking.com    Rules of weight loss:    Don't drink your calories. Eat fruit instead of the juice. No soda   Be smart about your budget. Focus on getting MORE food for less calories   Carbohydrate Protein Fats   1 cup vegetables = 25 calories  1 cup fruit = 413 calories  1 slice bread = 244 calories  1 cup rice/pasta = 250 calories  1 cup potato/beans = 200 calories 4 oz chicken, [place], white fish = 150 calories  4 oz pork, beef, salmon = 250 calories  1 egg = 90 calories  1 T peanut butter = 90 calories  1 T olive oil = 120 calories   1 avocado = 350 calories   1/4 cup nuts = 150 calories     Choose foods with more fiber and/or protein to keep you fuller, longer   Use VERY little oil/butter in cooking, even olive oil. Oil can be healthy, but too much increases weight   Eat every 3-4 hours (protein + carbohydrate / fruit + vegetable)   Be consistent. Don't over eat on the weekends   Enjoy 1, small treat food every week   Eat meals away without distraction (tv, computer, phone, books)    Meal time Options   Breakfast am 1 cup cereal + 1 banana + soy milk   OR  1 banana, 1 cup soy milk, 0-10 almonds or 1 slice cheese  OR  1 slice bread + [place]   OR  Egg whites (optional with vegetables), 1 piece of fruit or 1 slice bread   Lunch pm 4-6 oz protein  2-3 cups vegetables  1/2 cup starch (rice, potato, pasta, corn, beans, 1 slice bread)   Snack pm 1 piece of fruit + 1 slice cheese or  2-72 almonds   Dinner pm 1 glass wine  4-6 oz protein  2-3 cups vegetables  1/2 cup starch (rice, potato, pasta, corn, beans, 1 slice bread)

## 2014-01-15 NOTE — Progress Notes (Signed)
INITIAL NUTRITION ASSESSMENT / INTERVENTION    Beginning Time: 1:30 PM  End Time: 2 PM     Total Minutes: 30 minutes    Tina [name] is a 58 year old female who presents with weight gain.  Visit via [name] interpreter, [place], via phone.  The patient is from [place]. Ethnicity: [place].  Patient's language is [place] and [name]. Patient able to read [place] and [name].  She has been in the [place] for > 5 years.  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  Patient lives with husband.     Subjective (including lifestyle changes): Patient reports    Gained 13 lbs in the last 1 year (epic indicates last few months)   Started drinking ~12-16 oz grape juice BID b/c she read a study that it helps with weight loss. Started this in May   Recently stopped eating rice/bread but didn't help with weight loss   No exercise currently and hasn't done much in the past       Family History    OTHER Mother     Comment: HTN, Heart problems    DES Exp Father     Comment: Died during heart catheterization at age 85    Glaucoma Father     Macular Degeneration Father          Patient has had weight gain for 1-6 months and has not had Medical Nutrition Therapy before today.      Current Outpatient Prescriptions:  FLUoxetine (PROZAC) 20 MG tablet Take 1 tablet by mouth daily. Disp: 30 tablet Rfl: 11   amLODIPine (NORVASC) 5 MG tablet Take 1 tablet by mouth daily. Disp: 30 tablet Rfl: 11     No current facility-administered medications for this visit.    Vitamins/Minerals/Herbal Supplements: None       Smoking status: Never Smoker     Smokeless tobacco: Not on file    Alcohol Use: Yes    Comment: Occasionally       Review of Patient's Allergies indicates:   Pravachol               Nausea Only, Other (See Comments)    Comment:Malaise    Labs reviewed: Yes  (For list of labs type "dot" LLNUTA)    Vitals:  Most Recent BP Reading(s)  01/15/14 : 126/80      Most Recent Height Reading(s)  01/10/13 : 5' 2.99" (1.6 m)         Most Recent Weight Reading(s)  01/15/14 : 69.4 kg (153 lb)  01/13/14 : 69.582 kg (153 lb 6.4 oz)  09/28/13 : 63.05 kg (139 lb)  02/14/13 : 66.407 kg (146 lb 6.4 oz)  02/11/13 : 66.679 kg (147 lb)     Weight change: Gained 13 pounds in the past 4 month(s).    Estimated body mass index is 27.11 kg/(m^2) as calculated from the following:    Height as of 01/10/13: 5' 2.99" (1.6 m).    Weight as of this encounter: 69.4 kg (153 lb).  BMI Category: 25-29.9 overweight    Highest weight: present       Lowest weight: not reviewed       Desired weight: < present         Physical Activity:  Type: not physically active     Barriers to physical activity include: lack of motivation    Activities of Daily Living: Independent    Chewing ability: no concerns   Swallowing ability: no concerns  Appetite: good    Food Allergies: No known allergies  Food Intolerances: none       Religious restrictions/Special diet: None  Food purchased by: husband  Food prepared by: husband  Meal sources: home cooked and take out / restaurant, 2 times per month  Economic factors and food assist: not indicated  Psychosocial factors / Mental Status: interactive with questions  Stress: Fair  Readiness to learn: eager    Dietary history / usual intake reveals patient attempts to follow low carb diet.  She eats 3 meals and 1 snacks per day.    Last few months, trying to eat every 3 hours and trying to avoid carbohydrates. Started drinking 3 months ago   Meal (8am) -1 c cereal (cheerios) + banana + soy milk   Meal (2pm) - tuna sandwich yesterday OR fruits (2-3 pieces) OR tuna + tomato + EVOO OR cereal    Meal (5pm) - very hungry. 1 glass wine, [place] breast + cheese and/or 2 c beans + EVOO + pita bread (but stopped buying)    Meal (7-8pm) - 1 glass wine or 55 kcal beer   Modifiers :   Was drinking 12-16 oz grape juice 2x/day b/c she heard it helps with weight loss  8-16 oz milk/d    Portion size is appropriate.  Eating frequency is appropriate  Eating  habits are cultural and based upon what's she's read to be healthy    Estimated intake shows: sub-optimal nutrients: calcium  excessive nutrients: calories and fluid calories    Assessment/Plan/Conclusion:  Patient presents with excessive energy intake related to knowledge deficit of the energy content of juice as evidenced by juice consumption in excess of recommended intake, providing >500 kcal/d.    Reviewed the following nutrition education:  Discussed impact of calories on weight  Kcal needed to maintain (1400 kcal per day) and lose weight (1200 kcal per day)   Methods to track calories  Label reading  Reviewed sources of calories (carbohydrate, proteins, fat)  Reviewed concept of calorie density   Liquid calories vs solid food  Fried foods / oils increase kcal  Grains/starches vs fruits/vegetables  High fat vs lean means  Discussed impact of quality and quantity of calories on weight management / satiety  Fiber   Protein/fat   Discussion of sample menu  Impact of meal frequency on appetite control (eg, eating every 3-4 hours or 3 meals, 1-2 snacks)  Role of physical activity and # of minutes needed to achieve weight loss  Importance of consistency in seeing results    Goals:   Based upon the education and discussion, patient and provider arrive at the following goals:   No juice/soda   Increase vegetables at lunch/dinner   Eat every 3-4 hours - include protein   Limit starch to 1 cup at meals    Barriers to dietary changes include: easily swayed by media    Material provided:client goals    Future educational needs:sodium    Referred to: None    Follow-up: No follow up scheduled at this time.    Anna Genre, RD,LDN,CDE

## 2014-01-26 ENCOUNTER — Ambulatory Visit (HOSPITAL_BASED_OUTPATIENT_CLINIC_OR_DEPARTMENT_OTHER): Payer: PRIVATE HEALTH INSURANCE | Admitting: Lab

## 2014-01-26 ENCOUNTER — Encounter (HOSPITAL_BASED_OUTPATIENT_CLINIC_OR_DEPARTMENT_OTHER): Payer: Self-pay | Admitting: Internal Medicine

## 2014-01-26 DIAGNOSIS — Z1211 Encounter for screening for malignant neoplasm of colon: Principal | ICD-10-CM

## 2014-01-26 DIAGNOSIS — Z1212 Encounter for screening for malignant neoplasm of rectum: Principal | ICD-10-CM

## 2014-01-26 LAB — POC IMMUNOASSAY FECAL OCCULT BLOOD TEST: POC FECAL OCCULT BLOOD TEST (IMMUNOASSAY): NEGATIVE

## 2014-01-26 NOTE — Progress Notes (Signed)
SPECIMEN RECEIVE BY MR

## 2014-03-09 ENCOUNTER — Ambulatory Visit (HOSPITAL_BASED_OUTPATIENT_CLINIC_OR_DEPARTMENT_OTHER): Payer: PRIVATE HEALTH INSURANCE

## 2014-03-11 ENCOUNTER — Ambulatory Visit (HOSPITAL_BASED_OUTPATIENT_CLINIC_OR_DEPARTMENT_OTHER): Payer: PRIVATE HEALTH INSURANCE

## 2014-03-11 ENCOUNTER — Encounter (HOSPITAL_BASED_OUTPATIENT_CLINIC_OR_DEPARTMENT_OTHER): Payer: Self-pay

## 2014-03-11 DIAGNOSIS — Z3009 Encounter for other general counseling and advice on contraception: Secondary | ICD-10-CM

## 2014-03-11 NOTE — Progress Notes (Addendum)
Reason for Visit: FPC    58 yo female [place]     Patient is here to sign a consent for Mirena IUD.  She has being on Mirena IUD for 5 yrs to help with heavy bleeding + cramps. She's very happy, saying that the IUD really helped with her health issues.  Wants to continue to use Mirena IUD.    Confidential Visit: Yes  Safe or Confidential Phone #: [phone]  Preferred Name: Tina [name]    Medications and allergies reviewed.      Smoking status: Never Smoker    Smokeless tobacco: Not on file       GYN:  No LMP recorded. Patient has had an implant. Using Mirena IUD for 5 yrs  Obstetric History   G4  P4  T4  P0  A0  TAB0  SAB0  E0  M0  L4     Comment: C/S done because baby was large and had hemorrhaging with prior deliveries.    Pregnancy Desired: No   Last GYN Exam (date/result): 09/2011  Hx of Abnormal PAP:  No  Current GYN Symptoms: No    Social:  Home Life: lives with her husband  Work: Sport and exercise psychologist sitting   School: no    Sexual History:    Sexual Activity: Yes   Partners: Male   Patent examiner Protection: Surgical     Current Partner(s): married for 36 yrs  Current Sexual Activity: Vaginal  Satisfaction with Current Method(s): Yes  Side Effects/Problems: No  Using Method Correctly: Yes  Contraceptive Use History: using Mirena IUD for 5 yrs, used Depo Provera and used ocp.  Contraindications Screening (as applicable): no    Date of Last Sexual Activity: three months  Protection Used: No  EC Need: No  Condom Use:  Never  Condom Breakage: No  Condom Instruction Given: yes    Sexual Concerns or Questions: no    History of STIs:    Recent Testing:  No  Testing Offered: Yes  Testing Accepted: No   -Disc: Transmission (Blood, Semen, Vaginal Fluid and Breastmilk), Risky Behaviors, Non-Risks/Myths,  Seroconversion Period , Result Waiting Time, Possible Results, Feelings Surrounding Possible Results, Plans for Informing of Results, Support Structures and Hep C Risk.    Risk Reduction Planning Completed     DV Assessment:  Hx of  DV/IP Abuse:  No  Hx of Sexual Assault:  No  Sexual Coercion: No  Birth Control Coercion:No  Received Money, Food, Shelter, or Drugs for Sex: No    Education/Counseling:  Importance of health maintenance and prevention PE  Anatomy/Physiology  Contraceptive overview: Risks, Benefits, Warning Signs, Effectiveness, Side Effects, Instructions, Follow Up  Informed Consent signed for:  Mirena  STD/HIV Risk Assessment Done  STD/HIV prevention/safer sex discussed  Condoms offered and  Declined  Emergency Contraception Discussed  Communication with parent or other supportive adult encouraged  Confidentiality  Clinic contact number given, for both daytime and after hours/emergencies  Handouts given    Plan:  Next PE Due: no  Testing Done: yes for Chlamydia and Gonorrhea (urine)  Prescriptions Done: No    After a discussion on all available contraceptive methods and a screening of contraindications per the U.S. Medical Eligibility Criteria, patient has chosen Progestin IUS- Mirena. Patient was counseled on risks, benefits, appropriate method use per the U.S. Selected Practice Recommendations, method effectiveness, follow up plan, protection from STIs, warning signs and what to do if they experience a warning sign, and confirms understanding by the teach-back method. Patient  has received the method fact sheet and will contact the health center with any questions or concerns.     Patient has an app with Dr Johny Shock on 03/16/14 for IUD removal and replace for a new one.    Irven Shelling

## 2014-03-13 LAB — CHLAMYDIA GC NAAT
GENPROBE CHLAMYDIA: NEGATIVE
GENPROBE GC: NEGATIVE

## 2014-03-16 ENCOUNTER — Ambulatory Visit (HOSPITAL_BASED_OUTPATIENT_CLINIC_OR_DEPARTMENT_OTHER): Payer: PRIVATE HEALTH INSURANCE | Admitting: Obstetrics & Gynecology

## 2014-03-16 ENCOUNTER — Ambulatory Visit (HOSPITAL_BASED_OUTPATIENT_CLINIC_OR_DEPARTMENT_OTHER): Payer: PRIVATE HEALTH INSURANCE

## 2014-03-16 ENCOUNTER — Ambulatory Visit (HOSPITAL_BASED_OUTPATIENT_CLINIC_OR_DEPARTMENT_OTHER): Payer: PRIVATE HEALTH INSURANCE | Admitting: Lab

## 2014-03-16 VITALS — BP 120/74 | Wt 153.0 lb

## 2014-03-16 DIAGNOSIS — N951 Menopausal and female climacteric states: Principal | ICD-10-CM

## 2014-03-16 DIAGNOSIS — N950 Postmenopausal bleeding: Secondary | ICD-10-CM

## 2014-03-16 DIAGNOSIS — Z30431 Encounter for routine checking of intrauterine contraceptive device: Secondary | ICD-10-CM

## 2014-03-16 DIAGNOSIS — R635 Abnormal weight gain: Secondary | ICD-10-CM

## 2014-03-16 DIAGNOSIS — E785 Hyperlipidemia, unspecified: Secondary | ICD-10-CM

## 2014-03-16 LAB — LIPID PANEL
Cholesterol: 231 mg/dL (ref 0–239)
HIGH DENSITY LIPOPROTEIN: 54 mg/dL (ref 40–?)
LOW DENSITY LIPOPROTEIN DIRECT: 172 mg/dL (ref 0–189)
TRIGLYCERIDES: 72 mg/dL (ref 0–150)

## 2014-03-16 LAB — FOLLICLE STIMULATING HORMONE: FOLLICLE STIMULATING HORMONE: 78.4 m[IU]/mL

## 2014-03-16 LAB — THYROID SCREEN TSH REFLEX FT4: THYROID SCREEN TSH REFLEX FT4: 1.64 u[IU]/mL (ref 0.358–3.740)

## 2014-03-16 NOTE — Addendum Note (Signed)
Addended by: Junius Roads on: 03/16/2014 11:59 AM     Modules accepted: Orders

## 2014-03-16 NOTE — Progress Notes (Signed)
S/  A Mirena was placed 5 years ago to control bleeding.  Currently she has occasional spotting.  The last time she had a little spotting was about 3-4 months ago.  She has hot flashes.  She has been on a soy based medication that she got from [place] for many years.  That controls the hot flashes.  She went off the medication anywhere from 2 months to two years ago, she does not remember when, and suffered from the hot flashes again.  The medication is OTC.  She does not know if it contains estrogen.      O/  VS reviewed    External genitalia normal.  BUS neg.  Vagina and cervix appeared normal.  The IUD string was visible.  Non tender, top normal size, anteverted uterus.  No adnexal masses or tenderness.    After a time out was done the IUD was removed and an EMB done without difficulty using the usual sterile technique.  EBL = 1 cc.  Post procedure pain = 3 on the pain scale.    A/  58 Y/O woman with an underlying medical history of HTN who presented for replacement of a Mirena IUD.  The current IUD was placed to control bleeding from a fibroid uterus.  She had an [facility] on 05/03/07 that was 30.86.  She is on a OTC preparation from [place] that controls her hot flashes.  She does not know if it contains estrogen.  She describes it as a soy based natural product.  The clinical picture is consistent with menopause.  She has been having infrequent spotting that could represent post menopausal bleeding.    P/ observe S/P removal of Mirena.  Check EMB done today.  [place] ordered.   F/U to review results.

## 2014-03-18 LAB — SURGICAL PATH SPECIMEN

## 2014-03-24 ENCOUNTER — Telehealth (HOSPITAL_BASED_OUTPATIENT_CLINIC_OR_DEPARTMENT_OTHER): Payer: Self-pay | Admitting: Internal Medicine

## 2014-03-24 NOTE — Progress Notes (Signed)
Call made to pt to discuss lab results    Lipid panel stable  ASCVD score 3.8%  Does not need to start a statin at this time    Normal TSH    Elevated FSH- per OB/GYN, suggestive pt is menopausal  Pt has f/u appt with OB/GYN 04/14/14       Pt also request endometrial biopsy results      ENDOMETRIAL BIOPSY:  - ENDOMETRIAL POLYP.  - NO HYPERPLASIA OR MALIGNANCY IS IDENTIFIED.  - MULTIPLE LEVELS EXAMINED.    Pt reassured

## 2014-03-31 ENCOUNTER — Telehealth (HOSPITAL_BASED_OUTPATIENT_CLINIC_OR_DEPARTMENT_OTHER): Payer: Self-pay | Admitting: Registered Nurse

## 2014-03-31 NOTE — Progress Notes (Signed)
[name] Press photographer used.Returned call to patient. Patient states that she had her IUD taken out and replaced on 03/16/14 patient states that since then she has has some lower left sided abdominal pain especially for the last 3 days. Patient states she did have some vaginal bleeding/spotting but that has stopped. Patient denies any fevers/vaginal discharge or urinary symptoms at this time. Advised patient to call GYN clinic for follow up and to discuss current symptoms. Patient agrees to call that clinic now with the interpreter.

## 2014-03-31 NOTE — Telephone Encounter (Signed)
-----   Message from [name] sent at 03/31/2014 10:37 AM EST -----  Regarding: left side abdominal pain  Contact: [phone]  Tina [name] is a 58 year old old female.    Patient's PCP: [name], APRN (General)    In case we get disconnected what is the best number to reach you at today:  Home Phone [phone] (home)    Person calling:  Patient (self)    How can I help you today:   Other: Patient is calling with left side abdominal pain, patient already discuss biopsy results but is still having a lot of pain. Would like to speak to Rn    Patient's language of care: [place]    Would you like an interpreter when the nurse calls you back?  YES  [name]

## 2014-03-31 NOTE — Telephone Encounter (Signed)
-----   Message from Valente David sent at 03/31/2014 10:56 AM EST -----  Regarding: abdominal pain      Tina [name] [id number], 58 year old, female, Telephone Information:  Home Phone      [phone]  Work Phone      Not on file.  Mobile          [phone]      Patient's Preferred Pharmacy:     [facility] ([facility])  Phone: [phone] Fax: [phone]      CONFIRMED TODAY: Christene Lye NUMBER: [phone]  Best time to call back:   Cell phone:   Other phone:    Available times:    Patient's language of care: [place]    Patient needs a [name] interpreter.    Patient's PCP: [name], APRN (General)    Person calling on behalf of patient: Patient (self)    Calls today with a sick call.  Abdominal pain

## 2014-03-31 NOTE — Progress Notes (Signed)
Due to the language barrier, the phone call was conducted in [name] with an interpreter. The interpreter's name is Eliane. The interpreter was on the phone during the entire phone call.    Spoke with pt.  Pt states she had an EMB recently and had some VB and abd pain for a few days after procedure then it improved.    Pt now c/o left abd pain x 3 days.  States pain is constant. It does improve with Ibuprofen 800mg .  Denies nausea, vomiting, diarrhea.  Denies uti sx.  No VB.  Having normal BM's.    Pt requesting an appt tomorrow.  Advised pt to go to ER for severe pain and fever.  Scheduled with Dr Annamary Rummage tomorrow.  Reminded pt she will still need to keep her appt 12/1 with Dr Johny Shock.

## 2014-04-01 ENCOUNTER — Ambulatory Visit: Payer: Self-pay | Admitting: Obstetrics & Gynecology

## 2014-04-01 ENCOUNTER — Ambulatory Visit (HOSPITAL_BASED_OUTPATIENT_CLINIC_OR_DEPARTMENT_OTHER): Payer: PRIVATE HEALTH INSURANCE | Admitting: Obstetrics & Gynecology

## 2014-04-01 VITALS — BP 100/70 | Wt 155.0 lb

## 2014-04-01 DIAGNOSIS — R102 Pelvic and perineal pain unspecified side: Secondary | ICD-10-CM

## 2014-04-01 MED ORDER — DOXYCYCLINE HYCLATE 100 MG PO TBEC
100.00 mg | DELAYED_RELEASE_TABLET | Freq: Two times a day (BID) | ORAL | Status: AC
Start: 2014-04-01 — End: 2014-04-15

## 2014-04-01 MED ORDER — NAPROXEN 500 MG PO TBEC
500.0000 mg | DELAYED_RELEASE_TABLET | Freq: Two times a day (BID) | ORAL | Status: DC
Start: 2014-04-01 — End: 2014-12-28

## 2014-04-01 MED ORDER — CEFTRIAXONE SODIUM 250 MG IJ SOLR
250.00 mg | Freq: Once | INTRAMUSCULAR | Status: AC
Start: 2014-04-01 — End: 2014-04-01
  Administered 2014-04-01: 250 mg via INTRAMUSCULAR

## 2014-04-01 NOTE — Progress Notes (Signed)
Ceftriaxone 250mg  given IM in left upper quad gluteus muscle, tolerated well.    Lot# [id number]  Exp 09/2014

## 2014-04-01 NOTE — Progress Notes (Signed)
Pt here for urgent visit  Had EMB 11/2, neg and removal of IUD  4 days ago started with pain, worse and not helped by motrin  Pain lower abd to left lower quadrant  No n/v/diarrhea  Feels feverish but when took temperature was normal  Cannot sleep at night secondary to pain, pain is constant  No abn dc or urinary complaints  Rarely sexually active as husband has diabetes    Gen: NAD  Pelvic: NAFG, + atrophy  Spec: normal appearing cervix and dc, no foul odor  Bimanual: nontender cervix, uterus with significant midline and LLQ tenderness, could not do full exam to palpate adnexa  Ext normal    A: acute pelvic pain, poss endometritis, ? Adnexal cyst  P: d/w pt secondary to tenderness with exam rec abx  Given ceftriaxone here, rx doxy/naproxen  Plan [place]  Has appt 2 weeks, reviewed precautions  20/25 min counseling visit  Frances Maywood

## 2014-04-02 ENCOUNTER — Telehealth (HOSPITAL_BASED_OUTPATIENT_CLINIC_OR_DEPARTMENT_OTHER): Payer: Self-pay | Admitting: Obstetrics & Gynecology

## 2014-04-02 LAB — US PELVIC NON-OB W TRANSVAG, 3D, DUPLEX

## 2014-04-02 NOTE — Telephone Encounter (Signed)
-----   Message from [facility] sent at 04/02/2014 12:39 PM EST -----  Regarding: test results  Tina [name] [id number], 58 year old, female, Telephone Information:  Home Phone      [phone]  Work Phone      Not on file.  Mobile          [place] NUMBER: [phone]  Cell phone:   Other phone:    Available times:    Patient's language of care: [place]    Patient needs a [name] interpreter.    Patient's PCP: [name], APRN (General)    Person calling on behalf of patient: Patient (self)    Calls today for test result(s).

## 2014-04-02 NOTE — Telephone Encounter (Signed)
-----   Message from [facility] sent at 04/02/2014 12:59 PM EST -----  Regarding: returning call  Tina [name] [id number], 58 year old, female, Telephone Information:  Home Phone      [phone]  Work Phone      Not on file.  Mobile          [place] NUMBER: [phone]  Cell phone:   Other phone:    Available times:    Patient's language of care: [place]    Patient needs a [name] interpreter.    Patient's PCP: [name], APRN (General)    Person calling on behalf of patient: Patient (self)    Calls today returning phone call.

## 2014-04-02 NOTE — Progress Notes (Signed)
TC to pt  informred her that [place] was normal. Still having pain, medication has helped some.    Has appt 04/14/14 with Dr. Johny Shock

## 2014-04-02 NOTE — Progress Notes (Signed)
Due to the language barrier, the phone call was conducted in [place] with an interpreter. The interpreter's name is Rosana. The interpreter was on the phone during the entire phone call.    TC to pt  Left message asking her to call [facility] back for results.

## 2014-04-02 NOTE — Progress Notes (Signed)
Please inform pt that her US is normal  Thank you  Kendrell Lottman

## 2014-04-06 NOTE — Progress Notes (Signed)
Ok  df

## 2014-04-14 ENCOUNTER — Ambulatory Visit (HOSPITAL_BASED_OUTPATIENT_CLINIC_OR_DEPARTMENT_OTHER): Payer: PRIVATE HEALTH INSURANCE | Admitting: Obstetrics & Gynecology

## 2014-04-14 VITALS — BP 112/72 | Wt 155.0 lb

## 2014-04-14 DIAGNOSIS — N950 Postmenopausal bleeding: Principal | ICD-10-CM

## 2014-04-14 NOTE — Progress Notes (Signed)
S/  She has not had any bleeding.  She was put on an antibiotic after complaining of pelvic pain after the EMB.  The pain went away on the antibiotic.    O/  VS reviewed     EMB benign, polyp    FSH = 78.4    A/  Healthy 59 Y/O who had postmenopausal bleeding with a Mirena IUD in place.  The bleeding has stopped now that the Mirena has been removed.  An EMB done at the time the Mirena was removed was benign showing a possible polyp.  The [facility] is in the postmenopausal range.  Pain after the EMB that resolved on doxycycline may have been endometritis triggered by the IUD removal and EMB.      P/  Advised pt to call for any heavy vaginal bleeding or any bleeding at all after a year from now.    I have spent 15 minutes in face to face time with this patient/patient proxy of which > 50% was in counseling or coordination of care regarding above issues/Dx.

## 2014-05-27 ENCOUNTER — Ambulatory Visit (HOSPITAL_BASED_OUTPATIENT_CLINIC_OR_DEPARTMENT_OTHER): Payer: PRIVATE HEALTH INSURANCE | Admitting: Licensed Practical Nurse

## 2014-05-27 DIAGNOSIS — Z23 Encounter for immunization: Secondary | ICD-10-CM

## 2014-05-27 NOTE — Progress Notes (Signed)
05/27/2014  Flu Vaccine   Ordered by Provider  VIS given  Confirmed patient's name and date of birth.  Pt denies allergies to this vaccine.   Pt denies allergies to egg or egg products.  Pt denies allergy to contact lens solution.   Pt denies adverse effects from previous administration of this medication. Pt denies history of Guillain-Barre' syndrome.  Pt denies moderate/severe illness at this time.  Risks and benefits of Flu Vaccine reviewed with pt. VIS for Flu Vaccine  offered and reviewed with pt.    Vaccine tolerated well by patient. Patient denies adverse effects from injection at this time. Patient encouraged to utilize arm and not favor it.  Patient informed may  take pain reliever of choice and to apply ice for discomfort if necessary.  Patient will call with any questions or concerns.  Please refer to Imm./Inj. section for administration site, lot # and exp. date.    Manley Mason, LPN

## 2014-06-05 ENCOUNTER — Telehealth (HOSPITAL_BASED_OUTPATIENT_CLINIC_OR_DEPARTMENT_OTHER): Payer: Self-pay | Admitting: "Women's Health Care

## 2014-06-05 ENCOUNTER — Other Ambulatory Visit (HOSPITAL_BASED_OUTPATIENT_CLINIC_OR_DEPARTMENT_OTHER): Payer: Self-pay | Admitting: Internal Medicine

## 2014-06-05 NOTE — Telephone Encounter (Signed)
-----   Message from Ronna Polio sent at 06/05/2014 11:15 AM EST -----  Regarding: pt does not need rx: naprosyn  Tina [name] [id number], 59 year old, female, Telephone Information:  Home Phone      [phone]  Work Phone      Not on file.  Mobile          [place] NUMBER:    [phone]  Cell phone:   Other phone:    Available times:    Patient's language of care: [place]    Patient needs a [name] interpreter.    Patient's PCP: [name], APRN    Person calling on behalf of patient: Patient (self)    Calls today - does not need rx: naprosyn

## 2014-06-05 NOTE — Progress Notes (Signed)
PER Pharmacy, Tina [name] is a 59 year old female has requested a refill of amlodipine 5 mg.      Last Office Visit: 05/27/14  Last Physical Exam: 01/13/14      HTN Med:    Most Recent BP Reading(s)  04/14/14 : 112/72  04/01/14 : 100/70  03/16/14 : 120/74    Documented patient preferred pharmacies:    [place] ([facility])  Phone: [phone] Fax: [phone]

## 2014-06-05 NOTE — Progress Notes (Signed)
Due to the language barrier, the phone call was conducted in [place] with an interpreter. The interpreter's name is Silvana. The interpreter was on the phone during the entire phone call.    TC to pt  Left message we are returning her call and asked her to call us back.

## 2014-06-08 NOTE — Progress Notes (Signed)
TC to pt  Left message asking her to call [facility] back.

## 2014-06-09 NOTE — Progress Notes (Signed)
Due to the language barrier, the phone call was conducted in [name] with an interpreter. The interpreter's name is Jamesetta Geralds The interpreter was on the phone during the entire phone call.    States pain has resolved and no longer needs Naprosyn.

## 2014-12-28 ENCOUNTER — Encounter (HOSPITAL_BASED_OUTPATIENT_CLINIC_OR_DEPARTMENT_OTHER): Payer: Self-pay

## 2014-12-28 ENCOUNTER — Emergency Department (HOSPITAL_BASED_OUTPATIENT_CLINIC_OR_DEPARTMENT_OTHER)
Admission: RE | Admit: 2014-12-28 | Disposition: A | Discharge status: Home / Self Care | Payer: Self-pay | Source: Emergency Department

## 2014-12-28 ENCOUNTER — Other Ambulatory Visit (HOSPITAL_BASED_OUTPATIENT_CLINIC_OR_DEPARTMENT_OTHER): Payer: Self-pay | Admitting: Internal Medicine

## 2014-12-28 DIAGNOSIS — Z1239 Encounter for other screening for malignant neoplasm of breast: Secondary | ICD-10-CM

## 2014-12-28 LAB — POC URINALYSIS
BILIRUBIN, URINE: NEGATIVE
GLUCOSE,URINE: NEGATIVE
KETONE, URINE: NEGATIVE
LEUKOCYTE ESTERASE: NEGATIVE
NITRITE, URINE: NEGATIVE
OCCULT BLOOD, URINE: NEGATIVE
PH URINE: 8.5 (ref 5.0–8.0)
PROTEIN, URINE: NEGATIVE
SPECIFIC GRAVITY, URINE: 1.015 (ref 1.003–1.030)
UROBILINOGEN URINE: 0.2 (ref 0.2–1.0)

## 2014-12-28 MED ORDER — LORAZEPAM 0.5 MG PO TABS
1.0000 mg | ORAL_TABLET | Freq: Once | ORAL | Status: DC
Start: 2014-12-28 — End: 2014-12-28

## 2014-12-28 MED ORDER — MECLIZINE HCL 25 MG PO TABS
25.00 mg | ORAL_TABLET | Freq: Once | ORAL | Status: AC
Start: 2014-12-28 — End: 2014-12-28
  Administered 2014-12-28: 25 mg via ORAL
  Filled 2014-12-28: qty 1

## 2014-12-28 MED ORDER — MECLIZINE HCL 25 MG PO TABS
25.00 mg | ORAL_TABLET | Freq: Every day | ORAL | 0 refills | Status: AC | PRN
Start: 2014-12-28 — End: 2015-01-07

## 2014-12-28 NOTE — Narrator Note (Signed)
Patient Disposition    Patient education for diagnosis, medications, activity, diet and follow-up.  Patient left ED 1:39 PM.  Patient rep received written instructions.  Interpreter to provide instructions: No/ Pt declined    Patient belongings with patient: YES    Have all existing LDAs been addressed? N/A    Have all IV infusions been stopped? N/A    Discharged to: Discharged to home

## 2014-12-28 NOTE — ED Provider Notes (Addendum)
The patient was seen primarily by me. ED nursing record was reviewed. Select prior records as available electronically through the Epic record were reviewed.     History, physical exam and disposition were conducted with an official hospital [place] interpreter.        HPI:   Tina [name] is a 59 year old female patient who presents nausea and vomiting and dizziness. The patient has had this since Friday. The patient states that it started when she woke up. The patient states that the room is moving. The patient has some nausea and vomiting. The patient was suppose to see her PCP the patient in the waiting room and before being evaluated was sent here. Mild headache. No fevers no chills. The patient is not having any abdominal pain. Able to ambulate. The patient has had labryinitis in the past. Meciziline has helped her in the past.     ROS: Pertinent positives were reviewed as per the HPI above.          Specifically,   Constitutional: No fever, no chills  Head / Eyes: No diplopia  ENT: no earache  Resp: No cough  Cards: No chest pain  Abd: No abdominal pain  Flank: No dysuria  Skin: No rash  Ext: No back pain  Neuro: No headache  Psych: No depression       All other systems were reviewed and are negative.     Macky Lower  Language of care: [place]  MRN: [id number]  PCP: [name], APRN  Mode of arrival to ED: Self.  Arrival time: 12/28/2014 11:06 AM  Chief complaint: Nausea (VOMITING & DIZZINESS x Friday) and Vomiting    Past Medical History/Problem list:    Past Medical History    Arthritis     H/o Condyloma     Comment: 2003 (vulvar)    HTN (hypertension)     Hypercholesteremia     Hyperopia     Presbyopia     Right ear pain 06/17/2010    Comment: Seen by ENT. 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 No signs of infection on the right. Sent for hearing test.     S/P appendectomy     S/P laparoscopic procedure     Comment: Exploratory     Patient Active Problem List:     Multiple  Breast Cysts     Menorrhagia     Hypertension     Hyperlipidemia LDL goal < 130     Hot flashes not due to menopause     Tendonitis     Sinusitis     DPH-CARE COORDINATION PROGRAM PARTICIPANT     Tension headache, chronic     Routine general medical examination at a health care facility     Atrial ectopy    Past Surgical History:     Past Surgical History    APPENDECTOMY  2000    Comment laparoscopy    ENDOMETRIAL BX W/WO ENDOCERVIX BX W/O DILAT SPX  9/04    Comment endometrial bx = negative    OB ANTEPARTUM CARE CESAREAN DLVR & POSTPARTUM      Comment tubal ligation    HYSTEROSCOPY BX ENDOMETRIUM&/POLYPC W/WO D&C      Comment 3 times in [place]     Social History: Alcohol:No  Tobacco:No  Cocaine/Heroin/Methamphetamine:No  Allergies: Review of Patient's Allergies indicates:   Pravachol               Nausea Only, Other (See Comments)  Comment:Malaise     Immunizations:   Immunization History   Administered Date(s) Administered   . Depo Provera 01/02/2005, 04/24/2008, 07/10/2008, 09/04/2008, 10/16/2008, 11/13/2008   . H1N1 0.32ml W/ Preserv 3 & > 07/10/2008   . INFLUENZA VIRUS TRI W/PRESV VACCINE 18/> YRS IM (PRIVATE) 03/19/2009, 01/28/2010, 03/22/2011, 03/12/2012   . Influenza Virus Quad Presv Free Vacc 3/> Yrs IM 05/27/2014   . Influenza Virus Quadrivalent Vacc 3/> Yrs Im 03/13/2013   . Td 04/20/2003   . Tdap 01/13/2014     Family Hx: noncontributory     Triage Vital Signs:  ED Triage Vitals   Enc Vitals Group      BP 12/28/14 1113 149/81      Pulse 12/28/14 1113 71      Resp 12/28/14 1113 18      Temp 12/28/14 1113 98.3 F      Temp src --       SpO2 12/28/14 1113 98 %      Weight --       Height --       Head Cir --       Peak Flow --       Pain Score --       Pain Loc --       Pain Edu? --       Excl. in [place]? --         Medications:  Prior to Admission Medications   Prescriptions Last Dose Informant Patient Reported? Taking?   FLUoxetine (PROZAC) 20 MG tablet 12/28/2014  No No   Sig: Take 1 tablet by mouth daily.    OTHER MEDICATION   Yes No   Sig: Isoflavones (A [place] OTC preparation to treat hot flashes)   amLODIPine (NORVASC) 5 MG tablet 12/28/2014  No No   Sig: Take 1 tablet by mouth daily.      Facility-Administered Medications: None     Physical Exam:   Constitutional: comfortable  Head / Eyes: NC/AT  ENT: midline trachea  Resp: no respiratory distress  Cards: no JVD  Abd: S/ND  Skin: no rash  Ext: No c/c/e  Neuro: speech fluent, III-XII intact, 5/5 strength in 4 ext, normal sensation, normal motor function, dix hallpike negative  Psych: normal mood            Medications Given in the ED:  Medications   meclizine (ANTIVERT) tablet 25 mg (25 mg Oral Given 12/28/14 1253)     Radiology Results:    Lab Results :  Results for orders placed or performed during the hospital encounter of 12/28/14 (from the past 24 hour(s))   POC Urinalysis    Collection Time: 12/28/14 11:41 AM   Result Value    COLOR YELLOW    CLARITY CLEAR    GLUCOSE,URINE NEGATIVE    BILIRUBIN, URINE NEGATIVE    KETONE, URINE NEGATIVE    SPECIFIC GRAVITY, URINE 1.015    UROBILINOGEN URINE 0.2    OCCULT BLOOD, URINE NEGATIVE    PH URINE 8.5    PROTEIN, URINE NEGATIVE    NITRITE, URINE NEGATIVE    LEUKOCYTE ESTERASE NEGATIVE     Other Results (e.g. ECG):       ED Course and Medical Decision-making:  59 year old female patient has signs of peripheral vertigo. The patient has had this multiple times in the past. The patient has a completely neuro exam.  This is completely unlikely to be a central process.  The patient has completely  intact finger-nose, heel-to-shin, normal rapid alternating movements.  The patient has been having some bilateral ear pressure and pain.  Otherwise no tympanic membrane erythema.  The patient likely is having acute bilateral left otitis.  Otherwise there is no signs of BPPV.  Epley maneuvers will not be helpful.  The patient was given meclizine which is helped her in the past.  Otherwise if the patient can H she can be safely  discharged home with primary care doctor follow-up.       Patient/family educated on diagnosis; she states understanding and agrees with plan of care.  Reasons to return to the ED were reviewed in detail. She agrees with this plan and disposition.     Condition on Discharge: Improved and Stable     Diagnosis/Diagnoses:  Dizziness     Discharge Prescriptions:  New Prescriptions    MECLIZINE (ANTIVERT) 25 MG TABS    Take 1 tablet by mouth daily as needed for symptoms of vertigo

## 2014-12-28 NOTE — Discharge Instructions (Signed)
During my emergency room visit, I was treated for the following conditions:   Dizziness, nausea, vomiting    Description of Emergency Room Course:   You came to the emergency room with nausea, vomiting and feeling as if the "room was spinning". You were given meclizine and your dizziness went away. It is felt that you likely have a peripheral neuritis. You should call your primary care doctor today or tomorrow to give them an update and to schedule a follow up appointment. You were sent home with a prescription for meclizine which you can take for the dizziness.    Please come back to the emergency department if you have weakness, numbness, tingling, a worsened headache, worsening nausea and unable to keep down fluids or any other concerns.    Please call your Primary Care Provider's (PCP) office [phone] if you have:   Neurologic: severe headaches, falls, problem with your vision and weakness  Any other concerns or questions or are having trouble getting medicines or appointments you need.    FOR EMERGENCIES CALL 911    Please discuss the following issues with your Primary Care Provider (PCP):  Peripheral neuritis  Vertigo    Activity: as tolerated      Provider: Lahoma Rocker, MD

## 2014-12-28 NOTE — ED Provider Notes (Signed)
ED NOTE   12/28/2014    PATIENT INFO: Tina [name], [place] 59 year old female  DATE OF ADMISSION: 12/28/2014 11:06 AM  ROOM/BED LOCATION:  [facility] ROOM 2/02B    Chief Complaint:   Dizziness, nausea, vomiting.    History of Present Illness:   Ms. Hargraves is a 59 yo W with a hx of labyrinthitis and HTN, presenting with dizziness accompanied with nausea and vomiting x 4 days (Friday). The patient states that she woke up on Friday and felt that the room was spinning. She felt she had to hold onto the walls to steady herself. And has had nausea with watery emesis with the episodes. Unsure of how long the episodes last, but they are intermittent. Not brought on by any specific positioning or movement.  She was going to see her PCP today but had an episode of dizziness in the waiting room before she saw the nurse, and they suggested she present to the ED. Denies falls, syncope, LOC, trauma, head trauma. Denies f/c. Has mild frontal headache since in ED. No sick contacts. States she did have some ear pain 2 weeks ago, like "water was in her ear".    The patient states she has had similar episodes of this previously. States the symptoms first occurred when she was 41-41 years old. Her last episode was 2 years ago and she presented to the ED and improved greatly with medication (meclizine).    Review of Systems:  Constitutional: no fever/chills, no known sick contacts. Mild headache now in ED. +dizziness/room spinning. No syncope, LOC, trauma.  ENT/Mouth: + some ear pain 2 weeks ago.   CV: no chest pain, no palpitations  RESP: no SOB, no coughing/wheezing.  GI: +nausea vomiting. no abd pain, no d/c.  GU: no urinary sxs      Past Medical History:   HTN  Labyrinthitis   Menorrhagia     Past Surgical History:   C-section  Appendectomy    Social History:  Occupation:  babysitter  Tobacco: no   Alcohol: no  Drugs: no     Family History:  noncontributory    Allergies:   Review of Patient's Allergies indicates:    Pravachol               Nausea Only, Other (See Comments)    Comment:Malaise    Medications prior to Admission:   Prior to Admission Medications   Prescriptions Last Dose Informant Patient Reported? Taking?   FLUoxetine (PROZAC) 20 MG tablet 12/28/2014  No No   Sig: Take 1 tablet by mouth daily.   OTHER MEDICATION   Yes No   Sig: Isoflavones (A [place] OTC preparation to treat hot flashes)   amLODIPine (NORVASC) 5 MG tablet 12/28/2014  No No   Sig: Take 1 tablet by mouth daily.      Facility-Administered Medications: None       VITALS:   12/28/14  1113   BP: 149/81   Pulse: 71   Resp: 18   Temp: 98.3 F   SpO2: 98%       Intake/Output last 24hours (7a-7a):        Physical Exam:  Gen: Lying in bed, alert and awake, becomes dizzy midway through out interview and exam and appears uncomfortable  HEENT: CN III-XII intact bilaterally, has dizziness with horizontal eye movements  CV: RRR no MRG  Lungs: CTAB no WRR  Abd: +bs, soft, nontender throughout  Ext: WWP no LE edema  Neuro: 5/5 strength  and sensation intact in UE and LE bilaterally. Rapid alternating movements and heel to shin intact bilaterally. Negative dix hallpike.  Gait: deferred until no longer dizzy.     Recent Labs:  BMP: No results for input(s): NA, K, CL, CO2, BUN, CREAT, GLUCOSER in the last 72 hours.  Ca, Mg, Phos: No results for input(s): CA, MG, PHOS in the last 72 hours.  CBC: No results for input(s): WBC, HCT, PLTA, RBC in the last 72 hours.  Coagulation Labs: No results for input(s): PT, INR in the last 72 hours.    Invalid input(s): PTT  LFTs: No results for input(s): AST, ALT, TBILI, DBILI, ALKPHOS in the last 72 hours.  Pancreatic Enzymes: No results for input(s): AMY, LIP in the last 72 hours.  Urinalysis:  Recent Labs   12/28/14  1141   UACOL YELLOW   UACLA CLEAR   UAGLU NEGATIVE   UABIL NEGATIVE   UAKET NEGATIVE   SPEGRAVURINE 1.015   UAOCC NEGATIVE   UAPH 8.5   UAPRO NEGATIVE   UANIT NEGATIVE   LEUKOCYTES NEGATIVE     Troponin: No results for  input(s): TROPI in the last 72 hours.    Other important labs include: UA - reassuring, no nitrites, no occult blood, sp grav 1.015, no leuk est.    Assessment and Plan:  Ms. Market is a 59 yo W with a hx of labyrinthitis and HTN, presenting with dizziness accompanied with nausea and vomiting x 4 days (Friday), likely peripheral neuritis.     #Dizziness  -suggestive of peripheral neuritis, labrynthitis  -less likely BPPV given negative dix hallpike  -no focal neurologic deficits, cerebellar func intact; no concern for stroke  -given meclizine in ED  -patients dizziness improved and was able to ambulate to the restroom  -patient instructed to return to ED if worsening sxs, unable to keep down fluids, new weakness, tingling or numbness, worsening headache or any other concerns  -patient to follow up with PCP; call today for progress report and to set up f/u    Lahoma Rocker, MD, 12/28/2014, 12:46 PM    PGY-1  Pager: [phone]

## 2014-12-28 NOTE — Narrator Note (Addendum)
Renford Dills, RN  11:17 AM  Pt c/o intermittent nausea, vomitting and dizziness x Friday. Denies diarrhea. Pt reports episode of left sided chest pain radiating up to the left side of the neck yesterday, since resolved. Pt a&o x3, NAD at this time.  LS clear, active BS in all 4 quadrants, +generalized tenderness with palpation. Denies sick contacts. Pt awaiting provider eval.

## 2015-01-11 ENCOUNTER — Ambulatory Visit (HOSPITAL_BASED_OUTPATIENT_CLINIC_OR_DEPARTMENT_OTHER): Payer: Self-pay | Admitting: Internal Medicine

## 2015-02-04 ENCOUNTER — Other Ambulatory Visit (HOSPITAL_BASED_OUTPATIENT_CLINIC_OR_DEPARTMENT_OTHER): Payer: Self-pay

## 2015-02-04 DIAGNOSIS — R232 Flushing: Principal | ICD-10-CM

## 2015-02-04 NOTE — Progress Notes (Signed)
[facility] INTERNAL MED    Person calling on behalf of patient: Patient (self)    May list multiple medications in this section    Medicine Name: FLUoxetine (PROZAC) 20 MG tablet    Dosage:     Frequency (how many pills, how many times a day):     Number of pills left:     Documented patient preferred pharmacies:   [facility]  Phone: [phone] Fax: [phone]

## 2015-02-04 NOTE — Progress Notes (Signed)
PER Patient (self), Tina [name] is a 59 year old female has requested a refill of Fluoxetine 20mg .      Last Office Visit: 05/27/14  Last Physical Exam: 01/13/14      Other Med Adult:  Most Recent BP Reading(s)  12/28/14 : 155/85          CHOLESTEROL (mg/dL)   Date Value   03/16/2014 231   ----------    LOW DENSITY LIPOPROTEIN DIRECT (mg/dL)   Date Value   03/16/2014 172   ----------    HIGH DENSITY LIPOPROTEIN (mg/dL)   Date Value   03/16/2014 54   ----------    TRIGLYCERIDES (mg/dL)   Date Value   03/16/2014 72   ----------        THYROID SCREEN TSH REFLEX FT4 (uIU/mL)   Date Value   03/16/2014 1.640   ----------        TSH (THYROID STIM HORMONE) (uIU/mL)   Date Value   09/23/2010 1.52   ----------    No results found for: HGBA1C      No results found for: INR       Documented patient preferred pharmacies:    [place]  Phone: [phone] Fax: [phone]

## 2015-02-05 MED ORDER — FLUOXETINE HCL 20 MG PO TABS
20.0000 mg | ORAL_TABLET | Freq: Every day | ORAL | 0 refills | Status: DC
Start: 2015-02-05 — End: 2015-03-02

## 2015-02-05 NOTE — Progress Notes (Signed)
Norma please call pt  Fluoxetine refilled for 30 days.  Overdue for fu with PCP   recent ER eval for dizziness, needs fu with any provider this month for all issues. Thanks, Caleel Kiner

## 2015-02-19 ENCOUNTER — Ambulatory Visit (HOSPITAL_BASED_OUTPATIENT_CLINIC_OR_DEPARTMENT_OTHER): Payer: Medicaid Other | Admitting: Primary Care

## 2015-03-02 ENCOUNTER — Other Ambulatory Visit (HOSPITAL_BASED_OUTPATIENT_CLINIC_OR_DEPARTMENT_OTHER): Payer: Self-pay | Admitting: Nurse Practitioner

## 2015-03-02 DIAGNOSIS — R232 Flushing: Principal | ICD-10-CM

## 2015-03-02 NOTE — Progress Notes (Unsigned)
[facility], Tina [name] is a 59 year old female has requested a refill of fluoxetine 20.      Last Office Visit: 05-27-14  Last Physical Exam: 01-13-14      Other Med Adult:  Most Recent BP Reading(s)  12/28/14 : 155/85          CHOLESTEROL (mg/dL)   Date Value   03/16/2014 231   ----------    LOW DENSITY LIPOPROTEIN DIRECT (mg/dL)   Date Value   03/16/2014 172   ----------    HIGH DENSITY LIPOPROTEIN (mg/dL)   Date Value   03/16/2014 54   ----------    TRIGLYCERIDES (mg/dL)   Date Value   03/16/2014 72   ----------        THYROID SCREEN TSH REFLEX FT4 (uIU/mL)   Date Value   03/16/2014 1.640   ----------        TSH (THYROID STIM HORMONE) (uIU/mL)   Date Value   09/23/2010 1.52   ----------    No results found for: HGBA1C      No results found for: INR       Documented patient preferred pharmacies:    [facility]  Phone: [phone] Fax: [phone]

## 2015-03-03 NOTE — Progress Notes (Unsigned)
Pt needs appt for medicaiton refills--given 30 days. Has not been seen since 9/15. Please sched with Lauren or me. thx

## 2015-03-19 ENCOUNTER — Encounter (HOSPITAL_BASED_OUTPATIENT_CLINIC_OR_DEPARTMENT_OTHER): Payer: Self-pay

## 2015-03-19 ENCOUNTER — Emergency Department (HOSPITAL_BASED_OUTPATIENT_CLINIC_OR_DEPARTMENT_OTHER)
Admission: RE | Admit: 2015-03-19 | Disposition: A | Discharge status: Home / Self Care | Payer: Self-pay | Source: Emergency Department | Attending: Emergency Medicine | Admitting: Emergency Medicine

## 2015-03-19 ENCOUNTER — Ambulatory Visit (HOSPITAL_BASED_OUTPATIENT_CLINIC_OR_DEPARTMENT_OTHER): Payer: Medicaid Other | Admitting: Primary Care

## 2015-03-19 ENCOUNTER — Encounter (HOSPITAL_BASED_OUTPATIENT_CLINIC_OR_DEPARTMENT_OTHER): Payer: Self-pay | Admitting: Primary Care

## 2015-03-19 VITALS — BP 128/80 | HR 69 | Temp 98.2°F | Wt 156.0 lb

## 2015-03-19 DIAGNOSIS — Z1239 Encounter for other screening for malignant neoplasm of breast: Secondary | ICD-10-CM

## 2015-03-19 DIAGNOSIS — Z139 Encounter for screening, unspecified: Secondary | ICD-10-CM

## 2015-03-19 DIAGNOSIS — R232 Flushing: Principal | ICD-10-CM

## 2015-03-19 DIAGNOSIS — E785 Hyperlipidemia, unspecified: Secondary | ICD-10-CM

## 2015-03-19 DIAGNOSIS — Z1211 Encounter for screening for malignant neoplasm of colon: Secondary | ICD-10-CM

## 2015-03-19 DIAGNOSIS — R1031 Right lower quadrant pain: Secondary | ICD-10-CM

## 2015-03-19 LAB — POC URINALYSIS
BILIRUBIN, URINE: NEGATIVE
GLUCOSE,URINE: NEGATIVE
KETONE, URINE: NEGATIVE
LEUKOCYTE ESTERASE: NEGATIVE
NITRITE, URINE: NEGATIVE
OCCULT BLOOD, URINE: NEGATIVE
PH URINE: 8.5 (ref 5.0–8.0)
PROTEIN, URINE: NEGATIVE
SPECIFIC GRAVITY, URINE: 1.015 (ref 1.003–1.030)
UROBILINOGEN URINE: 0.2 (ref 0.2–1.0)

## 2015-03-19 LAB — CBC, PLATELET & DIFFERENTIAL
ABSOLUTE BASO COUNT: 0 10*3/uL (ref 0.0–0.1)
ABSOLUTE EOSINOPHIL COUNT: 0.2 10*3/uL (ref 0.0–0.8)
ABSOLUTE IMM GRAN COUNT: 0.01 10*3/uL (ref 0.00–0.03)
ABSOLUTE LYMPH COUNT: 1.8 10*3/uL (ref 0.6–5.9)
ABSOLUTE MONO COUNT: 0.4 10*3/uL (ref 0.2–1.4)
ABSOLUTE NEUTROPHIL COUNT: 3.3 10*3/uL (ref 1.6–8.3)
BASOPHIL %: 0.4 % (ref 0.0–1.2)
EOSINOPHIL %: 3 % (ref 0.0–7.0)
HEMATOCRIT: 39.8 % (ref 34.1–44.9)
HEMOGLOBIN: 13.4 g/dL (ref 11.2–15.7)
IMMATURE GRANULOCYTE %: 0.2 % (ref 0.0–0.4)
LYMPHOCYTE %: 31.5 % (ref 15.0–54.0)
MEAN CORP HGB CONC: 33.7 g/dL (ref 31.0–37.0)
MEAN CORPUSCULAR HGB: 31.4 pg (ref 26.0–34.0)
MEAN CORPUSCULAR VOL: 93.2 fL (ref 80.0–100.0)
MEAN PLATELET VOLUME: 12.1 fL (ref 8.7–12.5)
MONOCYTE %: 7.4 % (ref 4.0–13.0)
NEUTROPHIL %: 57.5 % (ref 40.0–75.0)
PLATELET COUNT: 189 10*3/uL (ref 150–400)
RBC DISTRIBUTION WIDTH STD DEV: 41.4 fL (ref 35.1–46.3)
RBC DISTRIBUTION WIDTH: 12.4 % (ref 11.5–14.3)
RED BLOOD CELL COUNT: 4.27 M/uL (ref 3.90–5.20)
WHITE BLOOD CELL COUNT: 5.7 10*3/uL (ref 4.0–11.0)

## 2015-03-19 LAB — BASIC METABOLIC PANEL
ANION GAP: 9 mmol/L (ref 5–15)
BUN (UREA NITROGEN): 15 mg/dL (ref 7–18)
CALCIUM: 8.8 mg/dL (ref 8.5–10.1)
CARBON DIOXIDE: 28 mmol/L (ref 21–32)
CHLORIDE: 103 mmol/L (ref 98–107)
CREATININE: 0.8 mg/dL (ref 0.4–1.2)
ESTIMATED GLOMERULAR FILT RATE: >60 mL/min (ref >60)
Glucose Random: 102 mg/dL (ref 74–160)
POTASSIUM: 4.1 mmol/L (ref 3.5–5.1)
SODIUM: 140 mmol/L (ref 136–145)

## 2015-03-19 LAB — URINALYSIS
BILIRUBIN, URINE: NEGATIVE
GLUCOSE, URINE: NEGATIVE MG/DL
KETONE, URINE: NEGATIVE MG/DL
LEUKOCYTE ESTERASE: NEGATIVE
NITRITE, URINE: NEGATIVE
OCCULT BLOOD, URINE: NEGATIVE
PH URINE: 8 (ref 5.0–8.0)
PROTEIN, URINE: NEGATIVE MG/DL
SPECIFIC GRAVITY URINE: 1.01 (ref 1.003–1.035)

## 2015-03-19 LAB — URINE PREGNANCY TEST (POINT OF CARE): HCG QUALITATIVE URINE: NEGATIVE

## 2015-03-19 LAB — CT ABDOMEN & PELVIS W IV CONTRAST

## 2015-03-19 MED ORDER — FLUOXETINE HCL 20 MG PO CAPS
20.0000 mg | ORAL_CAPSULE | Freq: Every day | ORAL | 6 refills | Status: DC
Start: 2015-03-19 — End: 2015-08-13

## 2015-03-19 MED ORDER — ACETAMINOPHEN 325 MG PO TABS
975.0000 mg | ORAL_TABLET | Freq: Once | ORAL | Status: AC
Start: 2015-03-19 — End: 2015-03-19
  Administered 2015-03-19: 975 mg via ORAL
  Filled 2015-03-19: qty 3

## 2015-03-19 MED ORDER — IOHEXOL 300 MG/ML IJ SOLN
100.00 mL | Freq: Once | INTRAMUSCULAR | Status: AC
Start: 2015-03-19 — End: 2015-03-19
  Administered 2015-03-19: 100 mL via INTRAVENOUS

## 2015-03-19 MED ORDER — ACETAMINOPHEN 500 MG PO TABS
500.0000 mg | ORAL_TABLET | Freq: Four times a day (QID) | ORAL | 0 refills | Status: DC | PRN
Start: 2015-03-19 — End: 2015-12-29

## 2015-03-19 MED ORDER — IOHEXOL 240 MG/ML IJ SOLN
525.00 mL | Freq: Once | INTRAMUSCULAR | Status: AC
Start: 2015-03-19 — End: 2015-03-19
  Administered 2015-03-19 (×2): 525 mL via ORAL

## 2015-03-19 MED ORDER — NORMAL SALINE FLUSH 0.9 % IV SOLN
60.00 mL | Freq: Once | INTRAVENOUS | Status: AC
Start: 2015-03-19 — End: 2015-03-19
  Administered 2015-03-19: 60 mL via INTRAVENOUS

## 2015-03-19 NOTE — Narrator Note (Signed)
Pt to CT scan.

## 2015-03-19 NOTE — Narrator Note (Signed)
Pt returned from CT scan.

## 2015-03-19 NOTE — Discharge Instructions (Signed)
[facility] EMERGENCY DEPARTMENT DISCHARGE INSTRUCTIONS      DIAGNOSIS & TREATMENT:  You were seen for right lower belly pain. Your blood tests and cat scan were normal. I am not sure what has been causing your pain but it's not emergent, nothing you need surgery for! Follow up with your doctor next week if it is still coming back. In the meantime try taking tylenol as needed for the pain.       WHEN SHOULD YOU RETURN TO THE ED?  Please return to the emergency room if your symptoms worsen.      Thank you for visiting the [facility] Emergency Department!

## 2015-03-19 NOTE — ED Notes (Signed)
[facility] EM Attending Sign Out Note        I received sign out from:  Dr Doylene Canning at 1500.    Patient presents with:  Abdominal Pain: Right ABDOMINAL PAIN      Pertinent HPI and pending factors:  Briefly, this is a 59 year old female with intermittent right lower quadrant pain.  Possibly had lapse Appendectomy the past, is unclear.  Sent down by PCP for evaluation.  Labs okay, given some Tylenol for her pain.  Pending CT scan.   Vital Signs:     03/19/15  1220   BP: 142/97   Pulse: 68   Resp: 16   Temp: 98.2 F   SpO2: 98%   Weight: 70.8 kg (156 lb)          Medications Given in the ED:    Medications   iohexol (OMNIPAQUE) solution 525 mL (525 mLs Oral Given 03/19/15 1400)   acetaminophen (TYLENOL) tablet 975 mg (975 mg Oral Given 03/19/15 1402)   iohexol (OMNIPAQUE) 300 MG/ML injection 100 mL (100 mLs Intravenous Given 03/19/15 1524)   sodium chloride 0.9 % flush 60 mL (60 mLs Intravenous Given 03/19/15 1524)    Lab/Rad Results:    Labs Reviewed   CBC + PLT + AUTO DIFF   BASIC METABOLIC PANEL   URINALYSIS   POC URINALYSIS   URINE PREGNANCY TEST (POINT OF CARE)             Hospital Course and MDM:    1600- CT scan is normal, no appendix visualized.  No diverticulitis.  On reassessment patient was very excited and happy that her scan was normal, giving me the thumbs up.  Says her pain is gone.    Patient/family educated on their diagnosis, she verbalizes understanding and agrees with plan of care. She was told to follow up with her primary care physician. I reviewed with her reasons to return to the Emergency Department, all questions were answered.    ED Disposition:     Impression(s):  Intermittent abdominal pain    Disposition:  Discharged home      Signed by Dr. Freddie Breech. Glori Bickers, [place]  Emergency Medicine Attending  [facility]

## 2015-03-19 NOTE — ED Triage Note (Signed)
Pt c/o intermittent right lower abdominal pain x 2 weeks.

## 2015-03-19 NOTE — ED Provider Notes (Signed)
[facility] Emergency Medicine Attending Note     History of Present Illness:    Tina [name] is a 59 year old female who came to the ED complaining of 2 weeks ago onset of right low quadrant abdominal pain, episodic, persists, worsening, now constant.  No associated fevers or chills.  No urinary or vaginal symptoms.  No trauma.  Sometimes is worse with walking, and when she lays down flat in bed, improved if she bends her knees.  Patient went to primary care clinic today, and was advised to come to the emergency department.  Symptoms persist.  No respiratory symptoms.  No pain in back.   Jakiera Ehler   MRN: [id number]  PCP: [name], APRN    Arrived to the ED by: Friend    History provided by: the patient and NP Rochele Raring from outpt clinic via phone.  Other: Reviewed nursing notes      Review of Systems:  As per HPI. All other systems were reviewed and are negative        Past Medical Hx:    Past Medical History    Arthritis     H/o Condyloma     Comment: 2003 (vulvar)    HTN (hypertension)     Hypercholesteremia     Hyperopia     Presbyopia     Right ear pain 06/17/2010    Comment: Seen by ENT. 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 No signs of infection on the right. Sent for hearing test.     S/P appendectomy     S/P laparoscopic procedure     Comment: Exploratory    Meds:    No current facility-administered medications for this encounter.   Current Outpatient Prescriptions:  FLUoxetine (PROZAC) 20 MG capsule Take 1 capsule by mouth daily Disp: 30 capsule Rfl: 6   amLODIPine (NORVASC) 5 MG tablet Take 1 tablet by mouth daily. Disp: 30 tablet Rfl: 11   OTHER MEDICATION Isoflavones (A [place] OTC preparation to treat hot flashes) Disp:  Rfl:          Past Surgical Hx:    Past Surgical History    APPENDECTOMY  2000    Comment laparoscopy    ENDOMETRIAL BX W/WO ENDOCERVIX BX W/O DILAT SPX  9/04    Comment endometrial bx = negative    OB ANTEPARTUM CARE CESAREAN DLVR & POSTPARTUM      Comment tubal ligation     HYSTEROSCOPY BX ENDOMETRIUM&/POLYPC W/WO D&C      Comment 3 times in [place]    Allergies:  Review of Patient's Allergies indicates:   Pravachol               Nausea Only, Other (See Comments)    Comment:Malaise   Social Hx:     Smoking status: Former Smoker     Smokeless tobacco: Not on file    Alcohol use Yes    Comment: Occasionally    Immunizations:  Immunization History   Administered Date(s) Administered   . Depo Provera 01/02/2005, 04/24/2008, 07/10/2008, 09/04/2008, 10/16/2008, 11/13/2008   . H1N1 0.71ml W/ Preserv 3 & > 07/10/2008   . INFLUENZA VIRUS TRI W/PRESV VACCINE 18/> YRS IM (PRIVATE) 03/19/2009, 01/28/2010, 03/22/2011, 03/12/2012   . Influenza Virus Quad Presv Free Vacc 3/> Yrs IM 05/27/2014   . Influenza Virus Quadrivalent Vacc 3/> Yrs Im 03/13/2013   . Td 04/20/2003   . Tdap 01/13/2014        Physical Examination:  ED Triage Vitals   Enc Vitals Group      BP 03/19/15 1220 142/97      Pulse 03/19/15 1220 68      Resp 03/19/15 1220 16      Temp 03/19/15 1220 98.2 F      Temp src --       SpO2 03/19/15 1220 98 %      Weight 03/19/15 1220 70.8 kg      Height --       Head Cir --       Peak Flow --       Pain Score 03/19/15 1220 4       Pain Loc --       Pain Edu? --       Excl. in [place]? --         General: Patient is well appearing in mild apparent acute distress, cooperative with exam    Eyes: no conjunctival injection    Head, ears, nose, and throat:   Normocephalic.  Atraumatic.   Ears grossly normal bilaterally.  Nose grossly normal.  Moist mucous membranes. Normal phonation.    Neck: Supple with full ROM. No lymphadenopathy.     Respiratory/chest: No respiratory distress, speaks in full sentences. Breath sounds are clear and equal bilaterally.    Cardiovascular: Well-perfused. Heart rate is regular, rate normal.  Capillary refill under 2 seconds.    Gastrointestinal: Abdomen is soft.  No distension.  + Hypoactive bowel sounds.  Moderate to severe RLQ tenderness, with Rovsing and mild  rebound.    Back: No tenderness. No CVAT.    Musculoskeletal: Normal muscle tone, moving all extremities.  Gait is normal.    Skin: Warm and well perfused.  No obvious rash.  No obvious erythema/ecchymosis.    Neurologic: Alert and oriented x 3, no focal deficits.    Psychiatric:  Appropriate affect for time of day, location, and circumstances.    Medications Given in the ED:    Medications   iohexol (OMNIPAQUE) solution 525 mL (525 mLs Oral Given 03/19/15 1300)   acetaminophen (TYLENOL) tablet 975 mg (975 mg Oral Given 03/19/15 1402)    Radiology and ECG:    CT Abd/Pelvis (IV and Oral contrast)    (Results Pending)        Lab Results:    Labs Reviewed   CBC + PLT + AUTO DIFF   BASIC METABOLIC PANEL   URINALYSIS   POC URINALYSIS   URINE PREGNANCY TEST (POINT OF CARE)    Vital Signs:     03/19/15  1220   BP: 142/97   Pulse: 68   Resp: 16   Temp: 98.2 F   SpO2: 98%   Weight: 70.8 kg (156 lb)        ED Course and Medical Decision Making:    Differential diagnosis includes diverticulitis, less likely appendicitis (as patient thinks she has her appendix out when they took a fibroid out in the past), ovarian cyst, constipation, other gastrointestinal or soft tissue etiology.  Patient indicates labs were drawn at outpatient clinic, the lab results do not appear in the computer at this time.  Spoke with NP Yisroel Ramming again-labs that were drawn were only for routine cholesterol, few other tests, would not include all samples required for laboratory evaluation and I would order.    Acetaminophen, patient declines stronger medication.    S/o at ~3pm to Dr. Glori Bickers:  Check CT abdomen and pelvis, disposition per results and per  reexamination of patient.    Signed by Dr. Judson Roch, MD, [facility]  Emergency Medicine Attending  [facility]    This Emergency Department patient encounter note was created using voice-recognition software and in real time during the ED visit. Please excuse any typographical errors  that have not been edited out.

## 2015-03-19 NOTE — ED Notes (Signed)
NP from clinic called in, sending pt over  [phone]  Tina [name]  69F HTN, Prozac, amlodipine, hx laparoscopic surgery  RLQ pain without f/c, intermittent 2 weeks, now constant.  PMH sts maybe appendicitis is past, laparoscopic surgery, no recall if appendix was removed.  No pain with walking, no fevers.  [name] speaking

## 2015-03-19 NOTE — Progress Notes (Signed)
CC:    HPI: Tina [name] is a 59 year old female who presents today for medication f/u    Pt of Tina [name], APRN, presents for med f/u. Has not been seen in clinic for over a year and needs refill of Prozac which she takes to control hot flashes. Feeling very well on this medication.     Compliant with BP meds, checks BP once a week at home, usually 120s/80s.    C/o RLQ pain for about 2 weeks with pain when changing positions and when sitting down. Has hx of ?laprascopic abd surgery but does not think her appendix was removed at that time (despite hx appy being in her chart). Has been going to work as usual. Denies fever, chills, n/v/d/c.              Patient Active Problem List:     Multiple Breast Cysts     Menorrhagia     Hypertension     Hyperlipidemia LDL goal < 130     Hot flashes not due to menopause     Tendonitis     Sinusitis     DPH-CARE COORDINATION PROGRAM PARTICIPANT     Tension headache, chronic     Routine general medical examination at a health care facility     Atrial ectopy      Past Medical History    Arthritis     H/o Condyloma     Comment: 2003 (vulvar)    HTN (hypertension)     Hypercholesteremia     Hyperopia     Presbyopia     Right ear pain 06/17/2010    Comment: Seen by ENT. 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 No signs of infection on the right. Sent for hearing test.     S/P appendectomy     S/P laparoscopic procedure     Comment: Exploratory       Current Outpatient Prescriptions on File Prior to Visit:  amLODIPine (NORVASC) 5 MG tablet Take 1 tablet by mouth daily. Disp: 30 tablet Rfl: 11   [DISCONTINUED] FLUoxetine (PROZAC) 20 MG capsule TAKE 1 CAPSULE BY MOUTH DAILY Disp: 30 capsule Rfl: 0   OTHER MEDICATION Isoflavones (A [place] OTC preparation to treat hot flashes) Disp:  Rfl:      No current facility-administered medications on file prior to visit.       OBJECTIVE:  BP 128/80  Pulse 69  Temp 98.2 F (36.8 C) (Oral)  Wt 70.8 kg (156 lb)  SpO2 96%  BMI 27.64  kg/m2  Physical Exam   Constitutional: She is oriented to person, place, and time. She appears well-developed and well-nourished.   HENT:   Head: Normocephalic and atraumatic.   Cardiovascular: Normal rate, regular rhythm, normal heart sounds and intact distal pulses.    Pulmonary/Chest: Effort normal and breath sounds normal.   Abdominal: Soft. Bowel sounds are normal. There is tenderness (exquisite RLQ tenderness with light touch, pt endorses rebound pain).   Musculoskeletal: She exhibits no edema.   Neurological: She is alert and oriented to person, place, and time.   Skin: Skin is warm and dry.   Psychiatric: She has a normal mood and affect. Her behavior is normal.       A/P:    (R23.2) Hot flashes not due to menopause  (primary encounter diagnosis)  Comment:   Plan: FLUoxetine (PROZAC) 20 MG capsule daily  Refilled for 6 mo  F/u prn            (  R10.31) Abdominal pain, right lower quadrant  Comment: appendicitis vs diverticulitis vs ovarian cyst vs other  Plan: Pt sent to ED for urgent eval and imaging  Walked to ED by [facility] int, [place]  F/u prn      (E78.5) Hyperlipidemia LDL goal < 130  Comment:   Plan: LOW DENSITY LIPOPROTEIN,DIRECT, COLLECTION         VENOUS BLOOD VENIPUNCTURE  F/u with results and prn            (Z12.39) Screening for breast cancer  Comment:   Plan: [facility] MAMMOGRAPHY SCREENING BILATERAL W CAD  F/u with results and prn              (Z13.9) Screening for condition  Comment:   Plan: VITAMIN D,25 HYDROXY, HEPATITIS B SURFACE         ANTIBODY, HEP B SURFACE ANTIGEN, COLLECTION         VENOUS BLOOD VENIPUNCTURE, HEPATITIS C ANTIBODY  F/u with results and prn                (Z12.11) Colon cancer screening  Comment:   Plan: POC IMMUNOASSAY FECAL OCCULT BLOOD TEST  F/u with results and prn                We discussed all current and new medications and the importance of compliance. The patient was ready to learn and no apparent learning or adherence barriers were identified. I explained the diagnosis  and treatment plan, and the patient expressed understanding of the content. The most common side effects of the prescribed medication(s) were explained to the patient. I attempted to answer any questions regarding the diagnosis and the proposed treatment.      HM-in progress  RTC prn  All of the pt's questions were answered today. Pt in agreement with the above plan and agrees to call us with any further questions, concerns, worstening of sx or new sx of concern.

## 2015-03-19 NOTE — ED Notes (Signed)
Bed: 10  Expected date: 03/19/15  Expected time: 12:01 PM  Means of arrival: Self  Comments:  [phone]  Tina [name]  71F HTN, Prozac, amlodipine, hx laparoscopic surgery  RLQ pain without f/c  PMH sts maybe appendicitis is past, laparoscopic surgery, no recall if appendix was removed.  No pain with walking, no fevers.  [name] speaking

## 2015-03-19 NOTE — Narrator Note (Signed)
Patient Disposition    Patient education for diagnosis, medications, activity, diet and follow-up.  Patient left ED 4:29 PM.  Patient rep received written instructions.  Interpreter to provide instructions: YES    Patient belongings with patient: YES    Have all existing LDAs been addressed? Yes    Have all IV infusions been stopped? N/A    Discharged to: Discharged to home

## 2015-03-22 LAB — HEPATITIS B SURFACE ANTIBODY: HEPATITIS B SURFACE ANTIBODY: NONREACTIVE

## 2015-03-22 LAB — VITAMIN D,25 HYDROXY: VITAMIN D,25 HYDROXY: 25 ng/mL — ABNORMAL LOW (ref 30.0–100.0)

## 2015-03-22 LAB — HEPATITIS B SURFACE ANTIGEN: HEPATITIS B SURFACE ANTIGEN: NONREACTIVE

## 2015-03-22 LAB — HEPATITIS C ANTIBODY: HEPATITIS C ANTIBODY: NONREACTIVE

## 2015-03-22 LAB — LOW DENSITY LIPOPROTEIN DIRECT: LOW DENSITY LIPOPROTEIN DIRECT: 198 mg/dL (ref 0–189)

## 2015-03-23 ENCOUNTER — Other Ambulatory Visit (HOSPITAL_BASED_OUTPATIENT_CLINIC_OR_DEPARTMENT_OTHER): Payer: Self-pay | Admitting: Primary Care

## 2015-03-23 ENCOUNTER — Encounter (HOSPITAL_BASED_OUTPATIENT_CLINIC_OR_DEPARTMENT_OTHER): Payer: Self-pay | Admitting: Primary Care

## 2015-03-23 DIAGNOSIS — E559 Vitamin D deficiency, unspecified: Secondary | ICD-10-CM

## 2015-03-23 MED ORDER — CHOLECALCIFEROL 25 MCG (1000 UT) PO TABS
1000.0000 [IU] | ORAL_TABLET | Freq: Every day | ORAL | 11 refills | Status: DC
Start: 2015-03-23 — End: 2016-04-03

## 2015-03-24 ENCOUNTER — Telehealth (HOSPITAL_BASED_OUTPATIENT_CLINIC_OR_DEPARTMENT_OTHER): Payer: Self-pay | Admitting: Internal Medicine

## 2015-03-24 ENCOUNTER — Encounter (HOSPITAL_BASED_OUTPATIENT_CLINIC_OR_DEPARTMENT_OTHER): Payer: Self-pay | Admitting: Primary Care

## 2015-03-24 ENCOUNTER — Ambulatory Visit (HOSPITAL_BASED_OUTPATIENT_CLINIC_OR_DEPARTMENT_OTHER): Payer: Medicaid Other | Admitting: Primary Care

## 2015-03-24 VITALS — BP 100/70 | HR 76 | Temp 97.6°F | Wt 157.0 lb

## 2015-03-24 DIAGNOSIS — E785 Hyperlipidemia, unspecified: Secondary | ICD-10-CM

## 2015-03-24 DIAGNOSIS — R1084 Generalized abdominal pain: Principal | ICD-10-CM

## 2015-03-24 DIAGNOSIS — K5900 Constipation, unspecified: Secondary | ICD-10-CM

## 2015-03-24 LAB — LIPID PANEL
Cholesterol: 251 mg/dL (ref 0–239)
HIGH DENSITY LIPOPROTEIN: 51 mg/dL (ref 40–?)
LOW DENSITY LIPOPROTEIN DIRECT: 175 mg/dL (ref 0–189)
TRIGLYCERIDES: 171 mg/dL — ABNORMAL HIGH (ref 0–150)

## 2015-03-24 MED ORDER — DOCUSATE SODIUM 100 MG PO CAPS
100.00 mg | ORAL_CAPSULE | Freq: Two times a day (BID) | ORAL | 0 refills | Status: AC
Start: 2015-03-24 — End: 2015-04-03

## 2015-03-24 NOTE — Progress Notes (Signed)
CC:    HPI: Tina [name] is a 59 year old female who presents today for f/u    Was seen in clinic last week and found to have severe RLQ tenderness on exam. Pt was walked to ED for eval. Notes reviewed.    Today pt is feeling better but still having more diffuse abd pain which is radiating to her lower back. Does have BM every day but admits the BM are small and somewhat hard. CT scan showed constipation. Has not been pushing fluids. No OTC bowel meds, has not been increasing fruits and veggies. Denies fever, chills, rash, c/p, blood in stool or urinary sx's.            Patient Active Problem List:     Multiple Breast Cysts     Menorrhagia     Hypertension     Hyperlipidemia LDL goal < 130     Hot flashes not due to menopause     Tendonitis     Sinusitis     DPH-CARE COORDINATION PROGRAM PARTICIPANT     Tension headache, chronic     Routine general medical examination at a health care facility     Atrial ectopy     Vitamin D deficiency      Past Medical History    Arthritis     H/o Condyloma     Comment: 2003 (vulvar)    HTN (hypertension)     Hypercholesteremia     Hyperopia     Presbyopia     Right ear pain 06/17/2010    Comment: Seen by ENT. 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 No signs of infection on the right. Sent for hearing test.     S/P appendectomy     S/P laparoscopic procedure     Comment: Exploratory       Current Outpatient Prescriptions on File Prior to Visit:  cholecalciferol (VITAMIN D3) 1000 UNIT tablet Take 1 tablet by mouth daily Disp: 30 tablet Rfl: 11   FLUoxetine (PROZAC) 20 MG capsule Take 1 capsule by mouth daily Disp: 30 capsule Rfl: 6   acetaminophen (TYLENOL) 500 MG tablet Take 1-2 tablets by mouth every 6 (six) hours as needed for Pain (Do not take more than 6 tablets in 24 hours) Disp: 30 tablet Rfl: 0   amLODIPine (NORVASC) 5 MG tablet Take 1 tablet by mouth daily. Disp: 30 tablet Rfl: 11   OTHER MEDICATION Isoflavones (A [place] OTC preparation to treat hot flashes) Disp:  Rfl:       No current facility-administered medications on file prior to visit.     OBJECTIVE:  BP 100/70  Pulse 76  Temp 97.6 F (36.4 C) (Oral)  Wt 71.2 kg (157 lb)  LMP 06/08/2008  SpO2 98%  BMI 27.82 kg/m2  Physical Exam   Constitutional: She is oriented to person, place, and time. She appears well-developed and well-nourished.   HENT:   Head: Normocephalic and atraumatic.   Cardiovascular: Normal rate, regular rhythm, normal heart sounds and intact distal pulses.    Pulmonary/Chest: Effort normal and breath sounds normal.   Abdominal: Soft. Bowel sounds are normal. She exhibits no distension and no mass. There is no tenderness. There is no rebound and no guarding.   Musculoskeletal:   No CVAT   Neurological: She is alert and oriented to person, place, and time.   Psychiatric: She has a normal mood and affect. Her behavior is normal.       LOW DENSITY  LIPOPROTEIN DIRECT (mg/dL)   Date Value   03/19/2015 198 (*H)   03/16/2014 172   01/10/2013 105   ----------      A/P:    (R10.84) Abdominal pain, generalized  (primary encounter diagnosis)  (K59.00) Constipation, unspecified constipation type  Comment:   Plan: docusate sodium (COLACE) 100 MG capsule PO 1-2 times per day,         REFERRAL TO GASTROENTEROLOGY ( INT)  Pt reassured  Drink 64 oz water every day  Increase fruits and veggies  Increase daily physical activity  F/u 2 weeks or sooner prn  GI appt 07/01/14              (E78.5) Hyperlipidemia LDL goal < 130  Comment: repeat fasting labs today  Plan: LIPID PANEL, COLLECTION VENOUS BLOOD         VENIPUNCTURE  F/u with results and prn            We discussed all current and new medications and the importance of compliance. The patient was ready to learn and no apparent learning or adherence barriers were identified. I explained the diagnosis and treatment plan, and the patient expressed understanding of the content. The most common side effects of the prescribed medication(s) were explained to the patient. I attempted  to answer any questions regarding the diagnosis and the proposed treatment.      HM-pe, ifob (given to pt), mammo  RTC 2 weeks or sooner prn  All of the pt's questions were answered today. Pt in agreement with the above plan and agrees to call us with any further questions, concerns, worstening of sx or new sx of concern.

## 2015-03-24 NOTE — Progress Notes (Unsigned)
Outreach to this pt w/ tel interpreter to check in w/ this pt to see if she wanted a sooner mammo appointment as it is scheduled for March. I was not able to reach this pt but left a vm for this to call back to let office know.

## 2015-03-30 ENCOUNTER — Encounter (HOSPITAL_BASED_OUTPATIENT_CLINIC_OR_DEPARTMENT_OTHER): Payer: Self-pay | Admitting: Primary Care

## 2015-03-30 ENCOUNTER — Ambulatory Visit (HOSPITAL_BASED_OUTPATIENT_CLINIC_OR_DEPARTMENT_OTHER): Payer: Medicaid Other | Admitting: Lab

## 2015-03-30 DIAGNOSIS — Z1211 Encounter for screening for malignant neoplasm of colon: Principal | ICD-10-CM

## 2015-03-30 LAB — POC IMMUNOASSAY FECAL OCCULT BLOOD TEST: POC FECAL OCCULT BLOOD TEST (IMMUNOASSAY): NEGATIVE

## 2015-03-30 NOTE — Progress Notes (Signed)
poc fecal occult blood test kit dropped off

## 2015-04-09 ENCOUNTER — Ambulatory Visit (HOSPITAL_BASED_OUTPATIENT_CLINIC_OR_DEPARTMENT_OTHER): Payer: Medicaid Other | Admitting: Primary Care

## 2015-04-09 ENCOUNTER — Encounter (HOSPITAL_BASED_OUTPATIENT_CLINIC_OR_DEPARTMENT_OTHER): Payer: Self-pay | Admitting: Primary Care

## 2015-04-09 VITALS — BP 120/82 | HR 76 | Temp 98.1°F | Wt 157.0 lb

## 2015-04-09 DIAGNOSIS — Z23 Encounter for immunization: Secondary | ICD-10-CM

## 2015-04-09 DIAGNOSIS — R1031 Right lower quadrant pain: Secondary | ICD-10-CM

## 2015-04-09 NOTE — Progress Notes (Signed)
04/09/2015  VIS given prior to administration and reviewed with the patient and or legal guardian. Patient understands the disease and the vaccine. See immunization/Injection module or chart review for date of publication and additional information.  Hinda Glatter, RN

## 2015-04-09 NOTE — Progress Notes (Signed)
CC:    HPI: Tina [name] is a 59 year old female who presents today for f/u    Continues to have RLQ throbbing, pressure-like pain almost every day. No identified triggers or patterns to this pain. Seems to always be present. Not constipated at all, having few soft formed BM per day. Drinking more water and eating more fiber since last visit. Negative ifob last week. CT results from ED visit:    Pelvic CT:    The bladder has a normal unenhanced appearance. No uterine or adnexal    abnormalities are evident but ultrasound would provide better    assessment if clinically warranted. There is no lymphadenopathy or    abnormal fluid. There is disc bulging or herniation at L4-L5. Bones    are intact. Small sclerotic bone lesions in the right ischium and base    of the right superior pubic ramus are unchanged and likely benign.      Impression:      1. No clear explanation for the reported pain and tenderness.    2. Diverticulosis without evidence of diverticulitis.    3. Borderline hydronephrosis, of unclear etiology or significance.    4. Moderate amount of stool raises the possibility of constipation.      Pt thinks she had ovarian issues in the past that felt the same as this pain. Willing to do pelvis and transvaginal u/s. Has appt to see GI in a few months, would like sooner appt.     Denies fever, chills, rash, HA, c/p, palpitations, n/v/d/c           Patient Active Problem List:     Multiple Breast Cysts     Menorrhagia     Hypertension     Hyperlipidemia LDL goal < 130     Hot flashes not due to menopause     Tendonitis     Sinusitis     DPH-CARE COORDINATION PROGRAM PARTICIPANT     Tension headache, chronic     Routine general medical examination at a health care facility     Atrial ectopy     Vitamin D deficiency      Past Medical History    Arthritis     H/o Condyloma     Comment: 2003 (vulvar)    HTN (hypertension)     Hypercholesteremia     Hyperopia     Presbyopia     Right ear pain 06/17/2010     Comment: Seen by ENT. 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 No signs of infection on the right. Sent for hearing test.     S/P appendectomy     S/P laparoscopic procedure     Comment: Exploratory       Current Outpatient Prescriptions on File Prior to Visit:  cholecalciferol (VITAMIN D3) 1000 UNIT tablet Take 1 tablet by mouth daily Disp: 30 tablet Rfl: 11   FLUoxetine (PROZAC) 20 MG capsule Take 1 capsule by mouth daily Disp: 30 capsule Rfl: 6   acetaminophen (TYLENOL) 500 MG tablet Take 1-2 tablets by mouth every 6 (six) hours as needed for Pain (Do not take more than 6 tablets in 24 hours) Disp: 30 tablet Rfl: 0   amLODIPine (NORVASC) 5 MG tablet Take 1 tablet by mouth daily. Disp: 30 tablet Rfl: 11   OTHER MEDICATION Isoflavones (A [place] OTC preparation to treat hot flashes) Disp:  Rfl:      No current facility-administered medications on file prior to visit.  OBJECTIVE:  BP 120/82  Pulse 76  Temp 98.1 F (36.7 C) (Oral)  Wt 71.2 kg (157 lb)  LMP 06/08/2008  SpO2 98%  BMI 27.82 kg/m2  Physical Exam   Constitutional: She is oriented to person, place, and time. She appears well-developed and well-nourished.   HENT:   Head: Normocephalic and atraumatic.   Cardiovascular: Normal rate, regular rhythm, normal heart sounds and intact distal pulses.    Pulmonary/Chest: Effort normal and breath sounds normal.   Abdominal: Soft. Bowel sounds are normal. There is tenderness (RLQ).   Neurological: She is alert and oriented to person, place, and time.   Skin: Skin is warm and dry.   Psychiatric: She has a normal mood and affect. Her behavior is normal.         A/P:    (R10.31) Abdominal pain, right lower quadrant  (primary encounter diagnosis)  Comment:   Plan: US PELVIC NONOBSTETRIC REAL-TIME IMAGE         COMPLETE, ULTRASOUND TRANSVAGINAL  F/u with results and prn            (Z23) Need for prophylactic vaccination and inoculation against viral hepatitis  Comment:   Plan: IMMUNIZATION ADMIN SINGLE, RN, PR  HEPB VACCINE         ADULT 3 DOSE SCHEDULE FOR IM USE            HM-not discussed  RTC prn  All of the pt's questions were answered today. Pt in agreement with the above plan and agrees to call us with any further questions, concerns, worstening of sx or new sx of concern.

## 2015-04-15 ENCOUNTER — Encounter (HOSPITAL_BASED_OUTPATIENT_CLINIC_OR_DEPARTMENT_OTHER): Payer: Self-pay | Admitting: Nurse Practitioner

## 2015-04-16 LAB — MA SCREENING MAMMO BILATERAL DIGITAL WITH DBT & CAD

## 2015-04-16 NOTE — Addendum Note (Signed)
Addended by: Rowan Blase on: 04/16/2015 03:09 PM     Modules accepted: Orders

## 2015-04-23 ENCOUNTER — Ambulatory Visit: Payer: Self-pay | Admitting: Primary Care

## 2015-04-23 DIAGNOSIS — R1031 Right lower quadrant pain: Secondary | ICD-10-CM

## 2015-04-23 NOTE — Addendum Note (Signed)
Addended by: Salley Scarlet on: 04/23/2015 05:29 PM     Modules accepted: Orders

## 2015-04-24 LAB — US PELVIC NON-OB W TRANSVAG, 3D, DUPLEX

## 2015-05-07 ENCOUNTER — Ambulatory Visit (HOSPITAL_BASED_OUTPATIENT_CLINIC_OR_DEPARTMENT_OTHER): Payer: Medicaid Other | Admitting: Registered Nurse

## 2015-05-07 DIAGNOSIS — Z23 Encounter for immunization: Secondary | ICD-10-CM

## 2015-05-07 NOTE — Progress Notes (Signed)
05/07/2015  VIS given prior to administration and reviewed with the patient and or legal guardian. Patient understands the disease and the vaccine. See immunization/Injection module or chart review for date of publication and additional information.  Hinda Glatter, RN

## 2015-05-24 ENCOUNTER — Ambulatory Visit (HOSPITAL_BASED_OUTPATIENT_CLINIC_OR_DEPARTMENT_OTHER): Payer: Medicaid Other | Admitting: Gastroenterology

## 2015-05-24 VITALS — BP 122/78 | HR 66 | Temp 98.7°F | Wt 159.0 lb

## 2015-05-24 DIAGNOSIS — R1031 Right lower quadrant pain: Principal | ICD-10-CM

## 2015-05-24 DIAGNOSIS — K5901 Slow transit constipation: Secondary | ICD-10-CM

## 2015-05-24 MED ORDER — PEG 3350-KCL-NABCB-NACL-NASULF 236 G PO SOLR
4.0000 L | Freq: Once | ORAL | 0 refills | Status: DC
Start: 2015-05-24 — End: 2015-05-24

## 2015-05-24 NOTE — Progress Notes (Signed)
Patient feels safe a home

## 2015-05-24 NOTE — Progress Notes (Signed)
CC: Tina [name] is a 60 year old [place] speaking female patient is referred by [name], APRN for an opinion on RLQ discomfort        HPI:  She states that one month ago she was in the ED for RLQ pain   She feels two separate pains, one on her RLQ and one periumbilically  She believes she has had a colonoscopy in [place]   She has diarrhea with lactose and notices this pain becomes worse  No constipation  Patient denies hematochezia, melena, constipation, nausea, vomiting, GERD, dysphagia/odynophagia, unexplained weight loss, decreased appetite/early satiety.  Patient takes motrin 800mg  3-4 times per week   Patient denies family history of colon cancer or polyps.   Patient has surgical history includes appendectomy in 2000, c section, hysteroscopy with biopsy     Past Medical Hx:  Patient Active Problem List:     Multiple Breast Cysts     Menorrhagia     Hypertension     Hyperlipidemia LDL goal < 130     Hot flashes not due to menopause     Tendonitis     Sinusitis     DPH-CARE COORDINATION PROGRAM PARTICIPANT     Tension headache, chronic     Routine general medical examination at a health care facility     Atrial ectopy     Vitamin D deficiency      Past Surgical Hx:    Past Surgical History    APPENDECTOMY  2000    Comment laparoscopy    ENDOMETRIAL BX W/WO ENDOCERVIX BX W/O DILAT SPX  9/04    Comment endometrial bx = negative    OB ANTEPARTUM CARE CESAREAN DLVR & POSTPARTUM      Comment tubal ligation    HYSTEROSCOPY BX ENDOMETRIUM&/POLYPC W/WO D&C      Comment 3 times in [place]       Review of Patient's Allergies indicates:   Pravachol               Nausea Only, Other (See Comments)    Comment:Malaise    Social Hx  Smoking status: Former Smoker                                                              Packs/day: 0.00      Years: 0.00      Alcohol use: Yes              Comment: Occasionally        Current Outpatient Prescriptions:  cholecalciferol (VITAMIN D3) 1000 UNIT tablet Take 1  tablet by mouth daily Disp: 30 tablet Rfl: 11   FLUoxetine (PROZAC) 20 MG capsule Take 1 capsule by mouth daily Disp: 30 capsule Rfl: 6   acetaminophen (TYLENOL) 500 MG tablet Take 1-2 tablets by mouth every 6 (six) hours as needed for Pain (Do not take more than 6 tablets in 24 hours) Disp: 30 tablet Rfl: 0   amLODIPine (NORVASC) 5 MG tablet Take 1 tablet by mouth daily. Disp: 30 tablet Rfl: 11   OTHER MEDICATION Isoflavones (A [place] OTC preparation to treat hot flashes) Disp:  Rfl:      No current facility-administered medications for this visit.     Family Hx:    Family History  OTHER Mother     Comment: HTN, Heart problems    DES Exp Father     Comment: Died during heart catheterization at age 1    Glaucoma Father     Macular Degeneration Father        Review of Systems:   HEENT: No loose teeth or nosebleeds.  Cardiovascular:  No MI, heart surgery or heart failure.  Pulmonary:  No pneumonia, COPD, chronic cough, TB, asthma or dyspnea.  Neuro:  No stroke or seizure.    Endocrine:  No diabetes or thyroid disease.    ID:  No TB, hepatitis A, hepatitis B, hepatitis C or HIV.    GU:  No kidney disease or bladder disease.  Heme/Onc:  No cancer.   Derm:  No rash, hives or shingles.    Psych: No alcoholism or substance abuse.  Musculoskeletal: No gout, muscle disease or lupus.      Physical Exam:  Vital Signs: BP 122/78  Pulse 66  Temp 98.7 F (37.1 C)  Wt 72.1 kg (159 lb)  LMP 06/08/2008  SpO2 98%  BMI 28.17 kg/m2 Body mass index is 28.17 kg/(m^2).  General: NAD.   HEENT: Normocephalic, atraumatic. No scleral icterus.  Pulmonary: No respiratory distress.   Cardiovascular: Normal rate and rhythm.  Abdominal: No tenderness on palpation. No hepatomegally or splenomegally.  Extremities: Without clubbing, cyanosis or edema.   Neurological: Normal gait.   Psych: Alert, oriented x 3. Normal affect and mood.  Derm: No rashes on exposed areas.       CT abd/pelvis:  Impression:      1. No clear explanation for the  reported pain and tenderness.    2. Diverticulosis without evidence of diverticulitis.    3. Borderline hydronephrosis, of unclear etiology or significance.    4. Moderate amount of stool raises the possibility of constipation.       Assessment/Plan: Tina [name] is a 59 year old [place] speaking female patient is referred by [name], APRN for an opinion on constipation and RLQ discomfort.   - check LFT, lipase, ttg/IgA, total IgA  - proceed with colonoscopy. Risks, benefits and alternatives discussed. Pt understands that risks include, but are not limited to, bleeding, perforation, drug reaction, infection, need for blood transfusion, need for surgery, and missing lesions. She understood these risks and consented to the exam.       Patient understands and agrees with the plan

## 2015-05-25 NOTE — Progress Notes (Signed)
I, Dr. Lenard Forth, personally interviewed, examined and reviewed the chart on this day and during this clinical session and formulated the plan.    Patient has RLQ with constipation. No recent colonoscopy. CT benign  PE: Benign    Impression:  RLQ discomfort  Constipation    Plan:  Colonoscopy    I have seen this Patient in clinical consultation and examined them in detail . The patient was seen with Linna Caprice, PA-C, please see her note from today's visit for the complete evaluation.    Together My PA and I have formulated a diagnostic and treatment plan which was in turn discussed with the patient.

## 2015-06-03 ENCOUNTER — Other Ambulatory Visit (HOSPITAL_BASED_OUTPATIENT_CLINIC_OR_DEPARTMENT_OTHER): Payer: Self-pay | Admitting: Internal Medicine

## 2015-06-03 DIAGNOSIS — I10 Essential (primary) hypertension: Principal | ICD-10-CM

## 2015-06-03 NOTE — Progress Notes (Unsigned)
PER Patient (self), Tina [name] is a 60 year old female has requested a refill of amlodipine 5 mg.      Last Office Visit: 04/09/15 with Rochele Raring  Last Physical Exam: 01/13/14      HTN Med:    Most Recent BP Reading(s)  05/24/15 : 122/78  04/09/15 : 120/82  03/24/15 : 100/70      Documented patient preferred pharmacies:    [facility]  Phone: [phone] Fax: [phone]

## 2015-06-03 NOTE — Progress Notes (Unsigned)
[facility] INTERNAL MED    Person calling on behalf of patient: Patient (self)    May list multiple medications in this section    Medicine Name: amLODIPine ([place]) 5 MG tablet      Number of pills left: 0    Documented patient preferred pharmacies:   [facility]  Phone: [phone] Fax: [phone]      [facility] NUMBER: [phone]      Patient's language of care: [place]    Patient needs a [name] interpreter.

## 2015-06-04 MED ORDER — AMLODIPINE BESYLATE 5 MG PO TABS
5.0000 mg | ORAL_TABLET | Freq: Every day | ORAL | 11 refills | Status: DC
Start: 2015-06-04 — End: 2015-08-13

## 2015-07-23 ENCOUNTER — Ambulatory Visit (HOSPITAL_BASED_OUTPATIENT_CLINIC_OR_DEPARTMENT_OTHER): Payer: Self-pay | Admitting: Primary Care

## 2015-07-23 DIAGNOSIS — Z1239 Encounter for other screening for malignant neoplasm of breast: Principal | ICD-10-CM

## 2015-07-26 ENCOUNTER — Telehealth (HOSPITAL_BASED_OUTPATIENT_CLINIC_OR_DEPARTMENT_OTHER): Payer: Self-pay

## 2015-07-26 NOTE — Progress Notes (Signed)
Spoke with pt with [name] interpreter. Pt states she never received any colon prep instructions. She does have the nulytely prep. Entire colon prep instructions reviewed with pt.                  Colonoscopy Standard       Division of Gastroenterology  [facility]  Phone:__________________  Address: [address] [place], [place] [address]    PLEASE READ CAREFULLY OR HAVE SOMEONE READ THIS TO YOU!      ____________________________________________  Name    Your Colonoscopy test is scheduled at _____________ Hospital    Day_______________     Time to Arrive _______________    Register ______________________            Tina [name] are having a colonoscopy.  This test lets the doctor see the inside your large intestine (also called your colon) using a small camera on a flexible tube.  There are several reasons to have the test:   *Screening for colon cancers and polyps    *To check unexplained changes in bowel habits   *To evaluate abnormal X-ray findings   *To look for bleeding ulcers or other abnormalities of the colon lining                 HOW TO GET READY FOR THE TEST      Your prescription was sent to your pharmacy or given to you.    Please pick up your prescription within 1 week of receiving these instructions.     IF YOU ARE USING NULYTELY OR A DRUG LIKE IT, DO NOT ADD WATER TO THE PRESCRIPTION UNTIL 1 DAY BEFORE THE DAY OF YOUR EXAM.    Your prescription was sent to: _______________________________________    Call a friend or a family member to make sure you have   someone to take you home after your test.   You must have someone to go home with after the test or we will not be able to give you sedatives!      If you have any questions or worries, or if you are unsure about how to prepare for this test, please call _____________                                  DAY OF THE TEST    4 to 5 hours before your test, drink the remaining half of the bottle of laxative.    It is very important to finish  the WHOLE GALLON.    The morning of the test, you can take your other medications at the usual time with a sip of water. These include blood pressure pills, seizure medications, heart medications, thyroid medications, etc).     Don't take pills for diabetes. Bring these medicines with you so that you can take them right after your test.      NOTHING to drink for 3 hours before the test.      IMPORTANT INFORMATION About Your Medicines  Based On Recommendations From Experts    IF YOU HAVE ANY CONCERNS ABOUT YOUR MEDICATIONS [place]. DO NOT WAIT UNTIL THE DAY BEFORE THE TEST!!!!!      Your Medications  You can take your medications for high blood pressure, heart problems, or anxiety with a sip of water at your usual time.  Bring a list of your medications with you.  If you take medicines for Type 2 Diabetes:    One day before the test: If you are taking diabetes pills: Take PILLS in the morning only. If you take morning insulin: take your usual dose.    On the night before the test: Do not take diabetes pills.  If you take insulin for type 2 diabetes, take  your usual long acting insulin.  For example: if you usually take 40 units of Lantus or NPH, take 20 units instead.    On the morning of the test: Do not take diabetes pills. If you are on long-acting insulin for type 2 diabetes, you can take half the dose. For example if you are on 40 units of Lantus or NPH, take 20 units instead.     After the test: You will be allowed to eat normally again. At that time, you should resume taking your diabetes pills at your usual times. If on insulin, take your usual evening dose of insulin. If you can't eat for any reason, check with your doctor.    If you have Type 1 Diabetes:    One day before the test: Take your usual long-acting basal insulin (Lantus or NPH) and make sure your clear liquid diets contain sugar. Check your blood sugars at meal times and cover with short acting insulin only if blood sugar is over  200. Otherwise do not take your short-acting insulin.    On the night before the test: Just take your usual basal insulin dose that you would normally take at that time (for example, your usual full dose of Lantus or NPH).    On the morning of the test: Take your usual basal insulin dose that you would normally take at that time (for example, your usual full dose of Lantus or NPH).                            Test Checklist    Please use this list to prepare for your test.     7 days before the test (____/____/____)     STOP taking Motrin, Advil, Naprosyn, Aleve or related drugs.   Continue to take all other prescribed meds.   Ask your doctor about whether you should continue Aspirin. It is sometimes        important to stay on this medication.    3 days before the test (____/____/____)     STOP eating any foods that contain beans, seeds, skins, nuts, or are high in fiber.    2 days before the test (____/____/____)     MAKE SURE YOU HAVE A RIDE ARRANGED    EAT dinner no later than 7 pm.  BEGIN a liquid diet. (Do not eat solids. This includes foods such as rice, pasta, bread and fruit).    1 day before the test (____/____/____)     PREPARE the laxative.   START taking the laxative at 6:00pm.   DRINK  the bottle of the laxative.    Day of your test (____/____/____)     FINISH  the rest of the laxative 5 to 6 hours before your test.     DO NOT drink anything 3 hours before the test.      WHAT TO EXPECT ON THE DAY OF YOUR TEST    Colonoscopy  Colonoscopy is a procedure used to see inside the colon and rectum.  During a colonoscopy a flexible tube with a camera  and a light is inserted through the rectum. The doctor then examines the large bowel (also called the large intestine or colon).    Colonoscopies can detect inflamed tissue, ulcers and abnormal growths called polyps. Some polyps are cancerous, but most are "pre cancerous". These might become dangerous someday. A doctor can usually remove these polyps during the  test. They are then sent to a laboratory to be checked. Polyps are common in adults and are generally harmless. However, most colorectal cancers begin as polyps. Removing them is a good way to prevent cancer. Your doctor may also take biopsies (small pieces of tissue) for analysis in the lab of abnormalities that they want to check in more detail.    It is extremely important to follow all of the steps for cleaning out your colon. If you do not follow all the steps, the doctor may not be able to see clearly. The exam might need to be cancelled and repeated another day. The clear liquids you can take during the clean out are treated as food by your body, so you won't starve or get dehydrated.        Getting Ready At [facility]  On the day of your test, a nurse and doctor will ask you some more information about your health history.  You will be put into a hospital gown. A small intravenous needle will be inserted in the back of your hand or forearm to give you medications that will make you comfortable during the test.    In the procedure room, you will be asked to lie down in a curled position on your left side.  Please inform the doctor if this is uncomfortable for you. You will be given oxygen to breathe. The test usually lasts 30 minutes to an hour. It may last longer if polyps need to be removed or if other abnormalities are noted.      You will be given medication through the IV in order to control discomfort and help you relax.  You may sleep or be partially awake during the test. Sometimes, you might feel a cramping sensation. We will monitor you and try to make you as comfortable as possible. The tube will be inserted into your rectum (back side) and advanced through the large bowel.  The doctor will try and look at all of the inner walls of your colon. The outer wall and organs outside the colon are not visible with this test.    Possible Complications  Complications are unusual during or after the test  but they can happen. The most common risks include colonic perforation (a tear in the colon), bleeding, respiratory problems, blood pressure problems, heart problems, discomfort and adverse reactions to the medications used.  A perforation may result in the need for emergency surgery and a colostomy bag. Also note, colonoscopy like other medical tests is not perfect. It may not detect problems such as polyps, cancers and other diseases up to 2 to 6% of the time. Luckily, the combined risk of all of these problems is small.    After the Test  You may feel bloated from air which was put into your colon during the test. You may also feel a little drowsy from the medications. You cannot drive or operate heavy machinery or do any important work for the rest of the day. You should plan on resting, watching TV or reading light material after the test. You may forget things that happen during and directly  after the test. It is important have someone with you that can remind you of any instructions we give.    Depending on what is found, you may need to have the colonoscopy repeated. Usually, this is done several years later but it may need to be done much sooner. Talk to your doctor about when you should have repeat study or other testing.    You will usually be at the hospital between 2-3 hours (although sometimes it can take longer).  We will make sure that you are alright before sending you home. You must arrange for someone to drive you home after the test.  Once again, we will not perform the test unless you have an arranged ride. You cannot go home in a taxi or a bus.    Colonoscopy is a safe and effective test that is commonly done at our facilities. You may receive a call to remind you of the date and time of your test. If you have any questions, please feel free to call.     For questions about the test itself, call [phone].    For questions about the date and time of your test, or to change the date or time,  call [phone]    For questions regarding your regular medications or health issues, please call your doctor.

## 2015-08-13 ENCOUNTER — Ambulatory Visit (HOSPITAL_BASED_OUTPATIENT_CLINIC_OR_DEPARTMENT_OTHER): Payer: Medicaid Other | Admitting: Internal Medicine

## 2015-08-13 ENCOUNTER — Encounter (HOSPITAL_BASED_OUTPATIENT_CLINIC_OR_DEPARTMENT_OTHER): Payer: Self-pay | Admitting: Internal Medicine

## 2015-08-13 VITALS — BP 120/80 | HR 85 | Temp 98.4°F | Wt 160.0 lb

## 2015-08-13 DIAGNOSIS — R232 Flushing: Secondary | ICD-10-CM

## 2015-08-13 DIAGNOSIS — I10 Essential (primary) hypertension: Secondary | ICD-10-CM

## 2015-08-13 MED ORDER — FLUOXETINE HCL 20 MG PO CAPS
20.0000 mg | ORAL_CAPSULE | Freq: Every day | ORAL | 11 refills | Status: DC
Start: 2015-08-13 — End: 2015-11-25

## 2015-08-13 MED ORDER — AMLODIPINE BESYLATE 5 MG PO TABS
5.0000 mg | ORAL_TABLET | Freq: Every day | ORAL | 11 refills | Status: DC
Start: 2015-08-13 — End: 2016-08-30

## 2015-08-13 NOTE — Progress Notes (Signed)
SUBJECTIVE:    Subjective:     Tina [name] is a 60 year old female with hypertension.    Reports having 3  episodes of headaches, dizziness,, headaches sensation of heaviness of head and palpitations over the past 5 months  Last episode occurred 1 month ago. Reports checking her BP at the time of having these symptoms with BP ranges of 170-190's/100's  Denied chest pain, sob, syncope, swelling of extremities    Continues to take Norvasc 5 mg daily for HTN    Requests to have refills for Norvasc and Prozac 20 mg ( takes for hot flashes). Will be traveling to [place] next week to visit her daugther x 4-5 months     Abdominal pain: seen in clinic and with GI. Denies continuation of abdominal pain, constipation. Had to cancel her colonoscopy. Plans to reschedule it when she comes back from [place]        Current Outpatient Prescriptions:  FLUoxetine (PROZAC) 20 MG capsule Take 1 capsule by mouth daily Disp: 30 capsule Rfl: 11   amLODIPine (NORVASC) 5 MG tablet Take 1 tablet by mouth daily Disp: 30 tablet Rfl: 11   cholecalciferol (VITAMIN D3) 1000 UNIT tablet Take 1 tablet by mouth daily Disp: 30 tablet Rfl: 11   acetaminophen (TYLENOL) 500 MG tablet Take 1-2 tablets by mouth every 6 (six) hours as needed for Pain (Do not take more than 6 tablets in 24 hours) Disp: 30 tablet Rfl: 0   OTHER MEDICATION Isoflavones (A [place] OTC preparation to treat hot flashes) Disp:  Rfl:      No current facility-administered medications for this visit.    Hypertension ROS: taking medications as instructed, no medication side effects noted, no TIA's, no chest pain on exertion, no dyspnea on exertion, no swelling of ankles.   New concerns:     Objective:   BP 120/80  Pulse 85  Temp 98.4 F (36.9 C) (Oral)  Wt 72.6 kg (160 lb)  LMP 06/08/2008  SpO2 96%  BMI 28.35 kg/m2   Appearance healthy, alert and cooperative.  General exam BP noted to be well controlled today in office.   Lab review: labs are reviewed,  up to date and normal.     Assessment/PLAN  (I10) Essential hypertension  (primary encounter diagnosis)  Comment: BP stable in clinic today  Advised pt to continue to take Norvasc 5 mg daily  If BP > 160/90, can take an additional 5 mg tablet of Norvasc and recheck BP, advised pt to also call clinic  If BPs continue to be > 160/90 on a regular bases, will need to increase Norvasc, and should f/u in clinic or see a medical provider ( will be in [place])  Plan: amLODIPine (NORVASC) 5 MG tablet          (R23.2) Hot flashes not due to menopause  Comment: stable  Med refill given   Plan: FLUoxetine (PROZAC) 20 MG capsule

## 2015-08-31 ENCOUNTER — Other Ambulatory Visit (HOSPITAL_BASED_OUTPATIENT_CLINIC_OR_DEPARTMENT_OTHER): Payer: Self-pay | Admitting: Internal Medicine

## 2015-08-31 DIAGNOSIS — Z7189 Other specified counseling: Principal | ICD-10-CM

## 2015-10-07 ENCOUNTER — Ambulatory Visit (HOSPITAL_BASED_OUTPATIENT_CLINIC_OR_DEPARTMENT_OTHER): Payer: Self-pay | Admitting: Ambulatory Care

## 2015-11-25 ENCOUNTER — Other Ambulatory Visit (HOSPITAL_BASED_OUTPATIENT_CLINIC_OR_DEPARTMENT_OTHER): Payer: Self-pay | Admitting: Internal Medicine

## 2015-11-25 DIAGNOSIS — R232 Flushing: Secondary | ICD-10-CM

## 2015-11-25 NOTE — Progress Notes (Signed)
PER Patient (self), Tina [name] is a 60 year old female has requested a refill of Fluoxetine 20mg     Patient requesting a new prescription be sent to [place]. Patient willing to pay out of pocket for medication.    Last Office Visit: 08/13/15   Last Physical Exam: 01/13/14       Other Med Adult:  Most Recent BP Reading(s)  08/13/15 : 120/80          Cholesterol (mg/dL)   Date Value   03/24/2015 251 (*H)   ----------    LOW DENSITY LIPOPROTEIN DIRECT (mg/dL)   Date Value   03/24/2015 175   ----------    HIGH DENSITY LIPOPROTEIN (mg/dL)   Date Value   03/24/2015 51   ----------    TRIGLYCERIDES (mg/dL)   Date Value   03/24/2015 171 (H)   ----------        THYROID SCREEN TSH REFLEX FT4 (uIU/mL)   Date Value   03/16/2014 1.640   ----------        TSH (THYROID STIM HORMONE) (uIU/mL)   Date Value   09/23/2010 1.52   ----------    No results found for: HGBA1C      No results found for: INR      SODIUM (mmol/L)   Date Value   03/19/2015 140   ----------      POTASSIUM (mmol/L)   Date Value   03/19/2015 4.1   ----------          CREATININE (mg/dL)   Date Value   03/19/2015 0.8   ----------          Documented patient preferred pharmacies:    [id number] - [place], [place]  Phone: [phone] Fax: [phone]

## 2015-11-25 NOTE — Progress Notes (Signed)
patient is in [place] asking for refill of medication to be sent to pharmacy out there, said she will pay out of pocket if needed.      [facility]NAL MED    Person calling on behalf of patient: Patient (self)    May list multiple medications in this section    Medicine Name:FLUoxetine (PROZAC)   Dosage: 20 MG capsule  Frequency (how many pills, how many times a day):   Number of pills left:       Documented patient preferred pharmacies:     [id number] - [place], [place]  Phone: [phone] Fax: [phone]

## 2015-11-26 MED ORDER — FLUOXETINE HCL 20 MG PO CAPS
20.0000 mg | ORAL_CAPSULE | Freq: Every day | ORAL | 11 refills | Status: DC
Start: 2015-11-26 — End: 2016-11-28

## 2015-12-29 ENCOUNTER — Ambulatory Visit (HOSPITAL_BASED_OUTPATIENT_CLINIC_OR_DEPARTMENT_OTHER): Payer: Medicaid Other

## 2015-12-29 ENCOUNTER — Encounter (HOSPITAL_BASED_OUTPATIENT_CLINIC_OR_DEPARTMENT_OTHER): Payer: Self-pay

## 2015-12-29 VITALS — BP 126/88 | HR 92 | Temp 98.9°F | Wt 160.0 lb

## 2015-12-29 DIAGNOSIS — M7062 Trochanteric bursitis, left hip: Secondary | ICD-10-CM

## 2015-12-29 DIAGNOSIS — M79605 Pain in left leg: Principal | ICD-10-CM

## 2015-12-29 MED ORDER — HYDROCODONE-ACETAMINOPHEN 5-325 MG PO TABS
1.00 | ORAL_TABLET | Freq: Two times a day (BID) | ORAL | 0 refills | Status: AC | PRN
Start: 2015-12-29 — End: 2016-01-01

## 2015-12-29 MED ORDER — CYCLOBENZAPRINE HCL 10 MG PO TABS
10.0000 mg | ORAL_TABLET | Freq: Three times a day (TID) | ORAL | 0 refills | Status: DC | PRN
Start: 2015-12-29 — End: 2015-12-31

## 2015-12-29 NOTE — Progress Notes (Signed)
Tina [name]    CC: leg pain    HPI: Tina [name] is a [name] 60 year old female with h/o depression, HTN  who presents to clinic for leg pain. She is [name]'s patient.    Visit was conducted with a [name] telephone interpreter. Patient comes to clinic with self.    Leg pain: Last Wednesday, was in [place], and left leg started to hurt. She went to the hospital, and was in a lot of pain. She had sciatic pain. She received morphine, and was discharged with hydrocodone-acetaminophen and cyclobenzaprine. She has run out of both of these meds. She tried to take ibuprofen with food, but her stomach could not handle it, and she stopped. Hasn't done exercises with a lot of pain.     Even though taking these meds, she still has pain. She is limping. The pain is less with the medication, but it comes back when not taking medication. She got an x-ray of her leg and was told she needs an MRI.     Leg pain started a month ago, and she was preparing to move. She was lifting heavy boxes and thinks maybe that was the trigger. Hurts in left butt and left lateral hip.  Started feeling back of calf pain after the hospital stay, and now it's better. Back of thigh is just a little pain -- feels like little ants. Sometimes feels like shock. Occasional numbness of L heel. No fevers, chills.     Exacerbators: No specific movements. But hurts to sit on it -- but she sleeps on right side now. In the morning, it hurts more, and there is more shock.     Alleviators: Resting. Medications help, but not with ibuprofen. Was Vicodin 6 times a day before, but now 2-3 tabs a day. Flexeril she takes twice a day.        Review of Systems   Constitutional: Negative for chills and fever.   Cardiovascular: Negative for leg swelling.   Musculoskeletal: Positive for joint pain and myalgias. Negative for falls.   Neurological: Positive for sensory change. Negative for focal weakness and weakness.           PMHx:   Patient Active Problem List:     Multiple Breast Cysts     Menorrhagia     Hypertension     Hyperlipidemia LDL goal < 130     Hot flashes not due to menopause     Tendonitis     Sinusitis     [name] COORDINATION PROGRAM PARTICIPANT     Tension headache, chronic     Routine general medical examination at a health [name] facility     Atrial ectopy     Vitamin D deficiency      Allergies: Review of Patient's Allergies indicates:   Pravachol               Nausea Only, Other (See Comments)    Comment:Malaise    Medications:   Current Outpatient Prescriptions on File Prior to Visit:  FLUoxetine (PROZAC) 20 MG capsule Take 1 capsule by mouth daily Disp: 30 capsule Rfl: 11   amLODIPine (NORVASC) 5 MG tablet Take 1 tablet by mouth daily Disp: 30 tablet Rfl: 11   cholecalciferol (VITAMIN D3) 1000 UNIT tablet Take 1 tablet by mouth daily Disp: 30 tablet Rfl: 11   OTHER MEDICATION Isoflavones (A [place] OTC preparation to treat hot flashes) Disp:  Rfl:      No  current facility-administered medications on file prior to visit.     Family/Social History:     Social History Narrative    Immigrated from [place] in 2003       reports that she has quit smoking. She does not have any smokeless tobacco history on file. She reports that she drinks alcohol. She reports that she does not use illicit drugs.   family history includes DES Exp in her father; Glaucoma in her father; Macular Degeneration in her father; OTHER in her mother.    Exam  BP 126/88  Pulse 92  Temp 98.9 F (37.2 C) (Oral)  Wt 72.6 kg (160 lb)  LMP 06/08/2008  SpO2 97%  BMI 28.35 kg/m2  Pain Score: 2 (2/10)  Most Recent Weight Reading(s)  12/29/15 : 72.6 kg (160 lb)  08/13/15 : 72.6 kg (160 lb)  05/24/15 : 72.1 kg (159 lb)  04/09/15 : 71.2 kg (157 lb)  03/24/15 : 71.2 kg (157 lb)    Gen: WDWN woman in NAD, but sitting on her R buttock on the chair to relieve the L buttock, very pleasant  HEENT:EOMI, MMM  Lung: CTAB, no wheezes, crackles, rhonchi  CV:  RRR  Back: No spinal tenderness.   Abd: +BS, soft, non-tender  Ext: No pedal edema bilaterally. +TTP across L buttock and L greater trochanter. Straight leg test negative bilaterally. ROM of R hip on external > internal rotation limited by pain. Tenderness to palpation on L groin over insertion of quadricep tendon  Skin: No rashes  Neuro/Psych: Alert, appropriate. Normal affect. L proximal strength test limited by pain. Otherwise 5/5 strength in bilateral lower extremities.     LABS:    [facility] ([name] Everywhere)    XRAY LUMBAR SPINE 2-3 VIEWS   12/24/2015 12:12 PM   PATIENT NAME: Tina [name] [place]  60 years old, Female  ORDERING PROVIDER: HELEN N PALATINUS  ACCESSION NUMBER: [id number]  CLINICAL INDICATION:  Back pain . From patient chart: 60 yo F with hx HTN with one mo hx intermittent L low back and hip pain with radiation to L knee, no hx trauma    COMPARISON:None    IMPRESSION:  Preservation of vertebral body height without acute vertebral body compression fracture. Multilevel mild anterior osteophytosis. Mild endplate degenerative changes in the lumbar spine are seen. Suspect facet arthropathy at L5-S1. No subluxation.    XRAY HIP LEFT 2-3 VIEWS   12/24/2015 12:13 PM   PATIENT NAME: Tina [name] [place]  60 years old, Female  ORDERING PROVIDER: HELEN N PALATINUS  ACCESSION NUMBER: [id number]  CLINICAL INDICATION:  Pain/Swelling . From patient chart: 60 yo F with hx HTN with one mo hx intermittent L low back and hip pain with radiation to L knee, no hx trauma    COMPARISON:None    IMPRESSION:   No displaced fracture. No dislocation. Calcifications are seen adjacent to the left femur greater trochanter as well as the left acetabulum. No radiopaque soft tissue foreign body.     ASSESSMENT/PLAN:  (M79.605) Left leg pain  (primary encounter diagnosis)  (M70.62) Trochanteric bursitis of left hip  Comment: Appears to be a combination of trochanteric bursitis with some mild sciatica  like symptoms, likely from inflammation of L gluteal muscles. This was probably started during the box lifting she did a month ago, and was exacerbated by other activity since then. It is reassuring that symptoms are improving, but she needs to go to PT to start strengthening the area. I will also refer  to ortho for potential steroid injection of L hip. Since pt was unable to tolerate NSAID even with meals, will represcribe muscle relaxant and opioid -- though have told her that this is the end of the prescription, and she will need to taper down with PT. She should also not be using heavy machinery whilst on these medications. She can use acetminophen up to 4000mg  a day, including the Vicodin.   Plan:   - REFERRAL TO ORTHOPEDICS ( INT),   - REFERRAL TO PHYSICAL THERAPY ( INT),   - cyclobenzaprine (FLEXERIL) 10 MG tablet x 15 tabs  -  HYDROcodone-acetaminophen (NORCO) 5-325 MG per tablet x 6 tabs  - home exercises given    Return to Clinic:  PRN worsening symptoms    Valeta Harms, MD, 12/29/2015, 6:34 PM

## 2015-12-29 NOTE — Patient Instructions (Signed)
You can take up to 4,000mg  of acetaminophen a day. (1000mg  four times a day)    Please do physical therapy    Index

## 2015-12-31 ENCOUNTER — Other Ambulatory Visit (HOSPITAL_BASED_OUTPATIENT_CLINIC_OR_DEPARTMENT_OTHER): Payer: Self-pay | Admitting: Internal Medicine

## 2015-12-31 DIAGNOSIS — M7062 Trochanteric bursitis, left hip: Secondary | ICD-10-CM

## 2015-12-31 DIAGNOSIS — M79605 Pain in left leg: Principal | ICD-10-CM

## 2015-12-31 NOTE — Progress Notes (Signed)
Will forward to Dr. Youlanda Roys  for review, seen in clinic 12/29/15

## 2015-12-31 NOTE — Progress Notes (Signed)
PER Patient (self), Tina [name] is a 59 year old female has requested a refill of NORCO 5-325MG .  Start Date:  12/29/15 End Date:  01/01/16        APPOINTMENT WITH ORTHO ISN'T UNTIL SEPT 1ST      Last [facility] Office Visit: 12/29/2015  Last Physical Exam: 01/13/2014      Other Med Adult:  Most Recent BP Reading(s)  12/29/15 : 126/88          Cholesterol (mg/dL)   Date Value   03/24/2015 251 (*H)   ----------    LOW DENSITY LIPOPROTEIN DIRECT (mg/dL)   Date Value   03/24/2015 175   ----------    HIGH DENSITY LIPOPROTEIN (mg/dL)   Date Value   03/24/2015 51   ----------    TRIGLYCERIDES (mg/dL)   Date Value   03/24/2015 171 (H)   ----------        THYROID SCREEN TSH REFLEX FT4 (uIU/mL)   Date Value   03/16/2014 1.640   ----------        TSH (THYROID STIM HORMONE) (uIU/mL)   Date Value   09/23/2010 1.52   ----------    No results found for: HGBA1C      No results found for: INR      SODIUM (mmol/L)   Date Value   03/19/2015 140   ----------      POTASSIUM (mmol/L)   Date Value   03/19/2015 4.1   ----------          CREATININE (mg/dL)   Date Value   03/19/2015 0.8   ----------      Documented patient preferred pharmacies:    [place]  Phone: [phone] Fax: [phone]

## 2015-12-31 NOTE — Progress Notes (Signed)
Spoke to patient due to refill request for Norco and flexeril.    Took both pills BID for both medicines and have run out. Needs refill. She is still having a lot of pain.    She took acetaminophen 500mg  x1 yesterday, working a little bit. Unable to go to PT appt b/c of work schedule. Will talk to boss about this.    I told her that  - she can take acetaminophen 1000mg  TID scheduled, altertnating with ibuprofen 400mg  TID with meals (before, she had stomach discomfort with ibuprofen 800mg , so I suggested reducing the dose to see if she can tolerate)  - she needs to go see PT. If she needs a letter from [place] for her boss, we can write this. Without PT she will not improve  - I will not be refilling the Norco   - I can refill the Flexeril -- but ONLY to be used if acetaminophen + ibuprofen is not working  - if pain is worsening despite this, she needs to be seen back in clinic again. She should call back.    Valeta Harms, MD, 01/04/2016, 7:34 AM

## 2015-12-31 NOTE — Progress Notes (Signed)
[facility] INTERNAL MED    Person calling on behalf of patient: Patient (self) Patient asking for more pain meds, says her appt with Orthopedics is until Sept 1st.     May list multiple medications in this section    Medicine Name:   cyclobenzaprine (FLEXERIL) 10 MG tablet  HYDROcodone-acetaminophen ([place]) 5-325 MG  Dosage:   Frequency (how many pills, how many times a day):     Number of pills left:     Documented patient preferred pharmacies:   [facility]  Phone: [phone] Fax: [phone]    Pharmacy Name:     Pharmacy Telephone Number:     Pharmacy  Fax Number:     CALL BACK NUMBER:     Cell phone:      Other phone:     Available times:     Patient's language of care: [place]    Patient needs a  interpreter.

## 2016-01-01 ENCOUNTER — Emergency Department (HOSPITAL_BASED_OUTPATIENT_CLINIC_OR_DEPARTMENT_OTHER)
Admission: RE | Admit: 2016-01-01 | Disposition: A | Discharge status: Home / Self Care | Payer: Self-pay | Source: Emergency Department | Attending: Physician Assistant | Admitting: Physician Assistant

## 2016-01-01 ENCOUNTER — Encounter (HOSPITAL_BASED_OUTPATIENT_CLINIC_OR_DEPARTMENT_OTHER): Payer: Self-pay

## 2016-01-01 LAB — XR ANKLE LEFT MINIMUM 3 VIEWS

## 2016-01-01 LAB — XR ANKLE RIGHT MINIMUM 3 VIEWS

## 2016-01-01 MED ORDER — ACETAMINOPHEN 325 MG PO TABS
650.00 mg | ORAL_TABLET | Freq: Once | ORAL | Status: AC
Start: 2016-01-01 — End: 2016-01-01
  Administered 2016-01-01: 650 mg via ORAL
  Filled 2016-01-01: qty 2

## 2016-01-01 NOTE — ED Triage Note (Signed)
Pt tripped and fell last night while going down the stairs  Pt C/O bilateral foot pain  Both feet appears swollen left foot appears slightly more swollen then right

## 2016-01-01 NOTE — Narrator Note (Signed)
Patient Disposition    Patient education for diagnosis, medications, activity, diet and follow-up.  Patient left ED 5:05 PM.  Patient rep received written instructions.  Interpreter to provide instructions: No    Patient belongings with patient: YES    Have all existing LDAs been addressed? N/A    Have all IV infusions been stopped? Stopped administratively for transfer    Discharged to: Discharged to home

## 2016-01-01 NOTE — ED Provider Notes (Signed)
ED nursing record was reviewed. Prior records as available electronically through the Epic record were reviewed.    HPI:    This 60 year old female patient presents to the Emergency Department with chief complaint of bilateral foot pain.  Patient reports earlier today she was going down the stairs with the lights off, states she missed the last step and fell forward.  Explain she was carrying several towels at the time which broke her fall, denies any trauma to her head or loss of consciousness, has been came immediately to help her to her feet.  Since incident she reports bilateral ankle pain, denies any numbness or tingling of her extremities.  Denies any chest pain or dizziness prior to the fall.      ROS: Pertinent positives were reviewed as per the HPI above. All other systems were reviewed and are negative.      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 No signs of infection on the right.                Sent for hearing test.   No date: S/P appendectomy  No date: S/P laparoscopic procedure      Comment: Exploratory  Patient Active Problem List:     Multiple Breast Cysts     Menorrhagia     Hypertension     Hyperlipidemia LDL goal < 130     Hot flashes not due to menopause     Tendonitis     Sinusitis     D[facility] COORDINATION PROGRAM PARTICIPANT     Tension headache, chronic     Routine general medical examination at a health care facility     Atrial ectopy     Vitamin D deficiency        Past Surgical History: Past Surgical History:  2000: APPENDECTOMY      Comment: laparoscopy  9/04: ENDOMETRIAL BX W/WO ENDOCERVIX BX W/O DILAT SPX      Comment: endometrial bx = negative  No date: HYSTEROSCOPY BX ENDOMETRIUM&/POLYPC  W/WO D&C      Comment: 3 times in [place]  No date: OB ANTEPARTUM CARE CESAREAN DLVR & POSTPARTUM      Comment: tubal ligation      Medications:   No current facility-administered medications on file prior to encounter.   Current Outpatient Prescriptions on File Prior to Encounter:  cyclobenzaprine (FLEXERIL) 10 MG tablet Take 1 tablet by mouth 3 (three) times daily as needed Disp: 15 tablet Rfl: 0   HYDROcodone-acetaminophen (NORCO) 5-325 MG per tablet Take 1 tablet by mouth every 12 (twelve) hours as needed for Pain Max Daily Amount: 2 tablets Disp: 6 tablet Rfl: 0   FLUoxetine (PROZAC) 20 MG capsule Take 1 capsule by mouth daily Disp: 30 capsule Rfl: 11   amLODIPine (NORVASC) 5 MG tablet Take 1 tablet by mouth daily Disp: 30 tablet Rfl: 11   cholecalciferol (VITAMIN D3) 1000 UNIT tablet Take 1 tablet by mouth daily Disp: 30 tablet Rfl: 11   OTHER MEDICATION Isoflavones (A [place] OTC preparation to treat hot flashes) Disp:  Rfl:  Social History:   Social History   Marital status: Married  Spouse name: [name]    Years of education: N/A  Number of children: 4     Occupational History  Babysitting SELF EMPLO Works daytime     Social History Main Topics   Smoking status: Former Smoker     Smokeless tobacco: Not on file    Alcohol use Yes    Comment: Occasionally    Drug use: No    Sexual activity: Yes    Partners: Male    Birth control/ protection: Surgical     Other Topics Concern   None on file     Social History Narrative    Immigrated from [place] in 2003           Allergies:  Review of Patient's Allergies indicates:   Pravachol               Nausea Only, Other (See Comments)    Comment:Malaise      Physical Exam:  BP 126/85  Pulse 102  Temp 99.2 F  Resp 16  Wt 72.6 kg (160 lb)  LMP 06/08/2008  SpO2 98%  BMI 28.35 kg/m2    GENERAL: Well appearing, No acute distress, non-toxic.   SKIN:  Warm & Dry, no erythema or rash.  HEAD:  NCAT. Sclerae are anicteric and aninjected  LUNGS:  Clear to  auscultation bilaterally. No wheezes, rales, rhonchi.   HEART:  RRR.  No murmurs, rubs, or gallops.   EXTREMITIES:  No obvious deformities.  CSM intact x 4.     LEFT LOWER EXTREMITY: No obvious deformity, soft tissue swelling, or ecchymosis present, mild tenderness to palpation to the anterior ankle as well as bilateral malleoli, mild discomfort with range of motion, no tenderness to palpation of the midfoot.  Distal pulse movement sensation intact.  RIGHT LOWER EXTREMITY: No obvious deformity, soft tissue swelling, or ecchymosis present, mild tenderness to palpation to the anterior ankle as well as bilateral malleoli, mild discomfort with range of motion, no tenderness to palpation of the midfoot.  Distal pulse movement sensation intact.  NEUROLOGIC:  Alert and oriented x4; moves all extremities well; speaking in clear fluent sentences  PSYCHIATRIC:  Appropriate for age, time of day, and situation    RESULTS  No results found for this visit on 01/01/16 (from the past 24 hour(s)).        ED Course and Medical Decision-making:  This 60 year old female patient  presents to the Emergency Department with chief complaint of bilateral foot pain.  Vital signs stable on presentation.  Physical exam significant for mild tenderness to palpation to bilateral anterior ankles and bilateral malleoli, neurovascularly intact, mild discomfort with range of motion of the ankles.  Considered strain, sprain, we'll also assess for acute fracture.  I do not suspect dangerous etiology of fall, this appears to be mechanical in nature.  Patient is walking with a stable gait.    Patient is given a dose of Tylenol.  X-ray with mild soft tissue swelling bilaterally without evidence of fracture.  Patient is reassured.  Advised RICE therapy. Provided with Ace wrap to bilateral ankles, offered a cane, patient accepts however she leaves the department prior to receiving her cane.  She'll follow up with PCP for reassessment within 1 week,  instructed to return to emergency department if she experiences worsening symptoms, new symptoms such as fever, vomiting, difficulty urinating, or she is unable to comply with discharge instructions.  Patient understands  and agrees with plan.      Condition: stable  Disposition: home      Diagnosis/Diagnoses:  Fall, initial encounter  Acute bilateral ankle pain    Debara Pickett, PA-C

## 2016-01-01 NOTE — Discharge Instructions (Signed)
DIAGNOSIS & TREATMENT:  You were seen in a [facility] Emergency Department for bilateral ankle pain and fall. You were treated with tylenol, ace wraps, and cane    TEST RESULTS:  Your xray did not show a fracture. Did show soft tissue swelling    FURTHER CARE:  RICE: rest, ice, compress, elevate extremities    NEW MEDICATIONS:    Tylenol every 6hrs for pain control    WHEN SHOULD YOU BE SEEN NEXT?   Please call your doctor and be seen with in the next 5 days for re-evaluation if your symptoms are not improving. If you do not have a primary care doctor or would like to transfer your primary care to [facility], please call [phone] to set one up an appointment.    WHEN SHOULD YOU RETURN TO THE ED?  Please return to the emergency room if you develop fever, increased difficulty breathing, you pass out, your symptoms worsen, you get new symptoms or you are unable to schedule follow up care.

## 2016-01-03 NOTE — Progress Notes (Signed)
PRECEPTOR NOTE  On the day of the patient's visit, I personally saw and evaluated the patient. In addition, I reviewed findings with the resident.  I confirm the key elements of history and physical exam as described in resident's note.  I agree with the assessment and plan as described above.  Please see resident's note for further details.  > 50% of this 30 minute visit was spent in face to face counseling and coordination of care.

## 2016-01-04 MED ORDER — CYCLOBENZAPRINE HCL 10 MG PO TABS
10.0000 mg | ORAL_TABLET | Freq: Three times a day (TID) | ORAL | 0 refills | Status: AC | PRN
Start: 2016-01-04 — End: 2016-01-09

## 2016-01-14 ENCOUNTER — Ambulatory Visit (HOSPITAL_BASED_OUTPATIENT_CLINIC_OR_DEPARTMENT_OTHER): Payer: Medicaid Other | Admitting: Orthopaedic Surgery

## 2016-01-14 DIAGNOSIS — M25552 Pain in left hip: Secondary | ICD-10-CM

## 2016-01-14 DIAGNOSIS — M71452 Calcium deposit in bursa, left hip: Principal | ICD-10-CM

## 2016-01-14 LAB — XR HIP LEFT 2 VIEWS & AP PELVIS

## 2016-01-14 MED ORDER — OMEPRAZOLE 20 MG PO CPDR: 20 mg | capsule | Freq: Every day | ORAL | 0 refills | 0 days | Status: AC

## 2016-01-14 MED ORDER — OMEPRAZOLE 20 MG PO CPDR
20.0000 mg | DELAYED_RELEASE_CAPSULE | Freq: Every day | ORAL | 0 refills | Status: DC
Start: 2016-01-14 — End: 2022-03-03

## 2016-01-14 MED ORDER — INDOMETHACIN 50 MG PO CAPS: 50 mg | capsule | Freq: Two times a day (BID) | ORAL | 0 refills | 0 days | Status: DC

## 2016-01-14 MED ORDER — INDOMETHACIN 50 MG PO CAPS
50.0000 mg | ORAL_CAPSULE | Freq: Two times a day (BID) | ORAL | 0 refills | Status: DC
Start: 2016-01-14 — End: 2016-01-28

## 2016-01-14 MED FILL — INDOMETHACIN 50MG: 30 days supply | Qty: 60 | Fill #0 | Status: CP

## 2016-01-14 MED FILL — *OMEPRAZOLE   20MG: 30 days supply | Qty: 30 | Fill #0 | Status: CP

## 2016-01-14 NOTE — Progress Notes (Signed)
PA addendum:    I personally examined and spoke with Tina [name].  She has had about a year of some increasing pain over the left hip and buttock area but 3 months ago it began to get more acute and has become constant.  She can't sit normally because of the pain in her left buttock and lateral hip area.  She denies trauma or any unusual activity that she can associate with the beginning of this discomfort.  On physical exam she is hypersensitive to palpation in the left buttock and moderately tender to palpation over the calcium deposits in the trochanteric bursa region.  Pain does not radiate below the knee but does go down the back of her thigh somewhat.  There is a large calcium deposit about one by one centimeter on the posterior aspect of her acetabulumShe'll be started on some Indocin 50 milligrams p.o. b.i.d. he'll return in 2 weeks if she hasn't gotten any improvement.  I would put her on some prednisone at that time and if that doesn't relieve her discomfort in a week or 2 I would probably order an MRI scan.

## 2016-01-14 NOTE — Progress Notes (Signed)
PGY-2 ORTHO CLINIC NOTE    No interpreter was required during this encounter.     Subjective:   Tina [name] is a 60 year old female who presents with the following concerns:    #) left hip pain  - seen for left hip pain 8/16 by Dr. Valeta [name] of internal medicine primary care  - XR of left hip reviewed at that visit showed "calcifications adjacent to the left femur, greater trochanter, and left acetabulum"  - today, patient reports that pain began 2 mos ago and was seen in ED in [place] 1 mo ago -- unclear etiology  - pain described as intermittent (but occurs every day), sharp, and shock-like  - pain sometimes radiates down to back of knee with some numbness/tingling  - not much relief from percocet or tylenol  - ibuprofen upsets stomach    #) left ankle pain  - fell 2 weeks ago, seen in ED, no fracture  - pain improving with tylenol  - no specific complaints today    Review of systems:  10 systems reviewed. Pertinent positives were reviewed as per the HPI above. All other systems were reviewed and are negative.    Patient Active Problem List:     Multiple Breast Cysts     Menorrhagia     Hypertension     Hyperlipidemia LDL goal < 130     Hot flashes not due to menopause     Tendonitis     Sinusitis     DPH-CARE COORDINATION PROGRAM PARTICIPANT     Tension headache, chronic     Routine general medical examination at a health care facility     Atrial ectopy     Vitamin D deficiency        Current Outpatient Prescriptions:   .  FLUoxetine (PROZAC) 20 MG capsule, Take 1 capsule by mouth daily, Disp: 30 capsule, Rfl: 11  .  amLODIPine (NORVASC) 5 MG tablet, Take 1 tablet by mouth daily, Disp: 30 tablet, Rfl: 11  .  cholecalciferol (VITAMIN D3) 1000 UNIT tablet, Take 1 tablet by mouth daily, Disp: 30 tablet, Rfl: 11  .  OTHER MEDICATION, Isoflavones (A [place] OTC preparation to treat hot flashes), Disp: , Rfl:     Review of Patient's Allergies indicates:   Pravachol               Nausea Only, Other (See  Comments)    Comment:Malaise    Objective:  LMP 06/08/2008  GEN:  Well appearing, NAD  HEENT:  PERRLA. EOMI. Sclera non-injected and anicteric.  HEART:  RRR, no murmurs, rubs or gallops.  PULM: Lungs clear to auscultation bilaterally, no wheezes, crackles or rhonchi.   MSK: LLE: no gross bony abnormalities or skin changes. Mod TTP of left greater trochanter, mod to severe TTP of left posterior inferior iliac spine.   NEURO: AAO x3, CN II-XII grossly intact, non-focal otherwise    Imaging:  XR Left Hip & Pelvis:  Prelim read by me and Dr. Bjorn [name] shows no acute fracture. ? Of a calcium deposit posteriorly adjacent to acetabulum. Also calcium deposits in area of greater trochanteric bursa     Labs:  N/A    Assessment and Plan:  Tina [name] is a 60 year old female with:    1. 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 Left hip pain  Assessment:  - obtained and reviewed XR Hip Left 2v & Pelvis, which showed calcium deposits adjacent to  left acetabulum of hip and left trochanteric bursa.  - no clear signs/sx of sciatica  - patient would benefit from an effective anti-inflammatory medication in addition to physical therapy  Plan:  - REFERRAL TO PHYSICAL THERAPY ( INT) for U/S treatment  - indomethacin (INDOCIN) 50 MG capsule; Take 1 capsule by mouth 2 (two) times daily with meals  Dispense: 60 capsule; Refill: 0  - omeprazole (PRILOSEC) 20 MG capsule; Take 1 capsule by mouth daily  Dispense: 30 capsule; Refill: 0  - f/u in 2 weeks to re-assess -- may consider steroid taper tx at that time      An after-visit summary was provided and patient instructions, if any, were reviewed and printed.  Tina [name] verbalized/demonstrated understanding and was advised to call or return to clinic with any concerns as needed.    Discussed with Dr. Dudley Major, MD, 01/14/2016, 10:51 AM

## 2016-01-18 ENCOUNTER — Telehealth (HOSPITAL_BASED_OUTPATIENT_CLINIC_OR_DEPARTMENT_OTHER): Payer: Self-pay | Admitting: Registered Nurse

## 2016-01-18 NOTE — Progress Notes (Signed)
Using [name] telephone interpreter, I spoke with patient regarding staff message stating she was having adverse effects(dizziness, H/A, nausea) from pain medicine and increased pain. Patient was advised to discontinue medication and call us back on Friday if symptoms resolve and provider will call in a different medication. She was also provided with a sooner appointment. Patient is aware that she should seek emergent care if pain is unbearable after stopping meds.

## 2016-01-24 ENCOUNTER — Ambulatory Visit (HOSPITAL_BASED_OUTPATIENT_CLINIC_OR_DEPARTMENT_OTHER): Payer: Medicaid Other | Admitting: Orthopaedic Surgery

## 2016-01-28 ENCOUNTER — Ambulatory Visit (HOSPITAL_BASED_OUTPATIENT_CLINIC_OR_DEPARTMENT_OTHER): Payer: Medicaid Other | Admitting: Orthopaedic Surgery

## 2016-01-28 DIAGNOSIS — M25552 Pain in left hip: Secondary | ICD-10-CM

## 2016-01-28 DIAGNOSIS — M71452 Calcium deposit in bursa, left hip: Principal | ICD-10-CM

## 2016-01-28 MED ORDER — PREDNISONE 10 MG PO TABS: tablet | ORAL | 0 refills | 0 days | Status: DC

## 2016-01-28 MED ORDER — OXYCODONE-ACETAMINOPHEN 5-325 MG PO TABS: 1 | tablet | ORAL | 0 refills | 0 days | Status: AC | PRN

## 2016-01-28 MED ORDER — OXYCODONE-ACETAMINOPHEN 5-325 MG PO TABS
1.0000 | ORAL_TABLET | ORAL | 0 refills | Status: AC | PRN
Start: 2016-01-28 — End: 2016-02-22

## 2016-01-28 MED ORDER — PREDNISONE 10 MG PO TABS
ORAL_TABLET | ORAL | 0 refills | Status: DC
Start: 2016-01-28 — End: 2016-02-07

## 2016-01-28 MED FILL — *OXYCOD/APAP 5-325MG: 17 days supply | Qty: 100 | Fill #0 | Status: CP

## 2016-01-28 MED FILL — PREDNISONE  10MG: 14 days supply | Qty: 54 | Fill #0 | Status: CP

## 2016-01-28 NOTE — Progress Notes (Signed)
Attending note:    I personally examined and spoke with Tina [name].  She has calcium deposits near the acetabulum of the left hip and some calcium deposits in the trochanteric bursa she is tender in the trochanteric bursa but also extremely tender proximal in the gluteus medius and near the sciatic notch although it doesn't give her sciatic pain.  She also has calcific inflammation in the hip joint capsule and in the bursa.  She was unable to take the Indocin because it bothered her stomach.  I'm going to put her a prednisone taper for 2 weeks and have her also take the omeprazole.  If she gets good relief at a higher dose but it starts to come back if that does get slower we may keep her on a 50 milligrams dose of prednisone for a month.

## 2016-01-28 NOTE — Progress Notes (Signed)
PGY-2 ORTHO CLINIC NOTE    A [name] interpreter was used during this encounter due to language barrier.     Subjective:   Tina [name] is a 60 year old female who presents with the following concerns:    #) f/u left hip pain  - seen in clinic 01/18/16 -- found to have calcium deposits adjacent to left acetabulum of hip and left trochanteric bursa.  - picked up indomethacin/omeprazole after last visit; took for about 4 days but felt very nauseous when taking noth. Still had pain while taking this.  Called ortho clinic and was told to d/c med and call back if sx resolved. Unclear which medication of the two caused an adverse reaction.  - today still having same pain in left hip -- still shock-like pain that radiates to groin  - unclear what causes the pain or what makes it better/worse  - intermittent associated left knee pain    Denies fever, no abnormal weight loss, change in vision, weakness    Review of systems:  10 systems reviewed. Pertinent positives were reviewed as per the HPI above. All other systems were reviewed and are negative.    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  indomethacin (INDOCIN) 50 MG capsule, Take 1 capsule by mouth 2 (two) times daily with meals, Disp: 60 capsule, Rfl: 0  .  omeprazole (PRILOSEC) 20 MG capsule, Take 1 capsule by mouth daily, Disp: 30 capsule, Rfl: 0  .  FLUoxetine (PROZAC) 20 MG capsule, Take 1 capsule by mouth daily, Disp: 30 capsule, Rfl: 11  .  amLODIPine (NORVASC) 5 MG tablet, Take 1 tablet by mouth daily, Disp: 30 tablet, Rfl: 11  .  cholecalciferol  (VITAMIN D3) 1000 UNIT tablet, Take 1 tablet by mouth daily, Disp: 30 tablet, Rfl: 11  .  OTHER MEDICATION, Isoflavones (A [place] OTC preparation to treat hot flashes), Disp: , Rfl:     Review of Patient's Allergies indicates:   Pravachol               Nausea Only, Other (See Comments)    Comment:Malaise    Objective:  LMP 06/08/2008  GEN:  Well appearing, NAD  HEENT:  PERRLA. EOMI. Sclera non-injected and anicteric.  HEART:  RRR, no murmurs, rubs or gallops.  PULM: Lungs clear to auscultation bilaterally, no wheezes, crackles or rhonchi.   MSK:  LLE: no gross bony abnormalities or skin changes. Mod TTP of left greater trochanter, mod to severe TTP of left posterior inferior iliac spine.   NEURO: AAO x3, CN II-XII grossly intact, non-focal otherwise    Imaging:  N/A    Labs:  N/A    Assessment and Plan:  Tina [name] is a 60 year old female with:    1. 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 Left hip pain  Assessment:  Patient is still having pain in the left hip 2/2 to calcium deposits in near the acetabulum of the left hip and the trochanteric bursa. the pain is intermittent and sharp when it does occur. She was unable to tolerate the indomethacin that was prescribed to her on last visit.  Physical exam is unchanged from previous visit. No signs/sx of sciatica. She would benefit now from a 14-day steroid taper and Percocet as needed for severe pain.   Plan:  - predniSONE (DELTASONE) 10 MG tablet; Take 6 tabs by mouth daily x 4d, then 5 tabs x 2d, 4 tabs x 2d, 3 tabs x2d, 2 tabs x 2d, 1 tab x 2d  Dispense: 54 tablet; Refill: 0  - oxyCODONE-acetaminophen (PERCOCET) 5-325 MG per tablet; Take 1 tablet by mouth every 4 (four) hours as needed for Pain Max Daily Amount: 6 tablets  Dispense: 100 tablet; Refill: 0  - Will follow up in 2 weeks for reassessment of left hip pain. sooner PRN. We will consider keeping her on a 50 mg dose of prednisone for one month if the pain has not resolved at next visit.    An  after-visit summary was provided and patient instructions, if any, were reviewed and printed.  Tina [name] verbalized/demonstrated understanding and was advised to call or return to clinic with any concerns as needed.    Discussed with Dr. Dudley Major, MD, 01/28/2016, 8:38 AM

## 2016-02-04 ENCOUNTER — Ambulatory Visit (HOSPITAL_BASED_OUTPATIENT_CLINIC_OR_DEPARTMENT_OTHER): Payer: Medicaid Other | Admitting: Internal Medicine

## 2016-02-07 ENCOUNTER — Ambulatory Visit (HOSPITAL_BASED_OUTPATIENT_CLINIC_OR_DEPARTMENT_OTHER): Payer: Medicaid Other | Admitting: Internal Medicine

## 2016-02-07 ENCOUNTER — Ambulatory Visit (HOSPITAL_BASED_OUTPATIENT_CLINIC_OR_DEPARTMENT_OTHER): Payer: Medicaid Other | Admitting: Orthopaedic Surgery

## 2016-02-07 DIAGNOSIS — M71452 Calcium deposit in bursa, left hip: Secondary | ICD-10-CM

## 2016-02-07 MED ORDER — HYDROMORPHONE HCL 4 MG PO TABS: 4 mg | tablet | ORAL | 0 refills | 0 days | Status: DC | PRN

## 2016-02-07 MED ORDER — HYDROMORPHONE HCL 4 MG PO TABS
4.0000 mg | ORAL_TABLET | ORAL | 0 refills | Status: DC | PRN
Start: 2016-02-07 — End: 2016-02-07

## 2016-02-07 MED ORDER — HYDROMORPHONE HCL 4 MG PO TABS: 4 mg | tablet | ORAL | 0 refills | 0 days | Status: AC | PRN

## 2016-02-07 MED ORDER — PREDNISONE 50 MG PO TABS: 50 mg | tablet | Freq: Every day | ORAL | 0 refills | 0 days | Status: AC

## 2016-02-07 MED ORDER — HYDROMORPHONE HCL 4 MG PO TABS
4.0000 mg | ORAL_TABLET | ORAL | 0 refills | Status: AC | PRN
Start: 2016-02-07 — End: 2016-02-14

## 2016-02-07 MED ORDER — PREDNISONE 50 MG PO TABS
50.00 mg | ORAL_TABLET | Freq: Every day | ORAL | 0 refills | Status: AC
Start: 2016-02-07 — End: 2016-02-27

## 2016-02-07 MED FILL — PREDNISONE  50MG: 20 days supply | Qty: 20 | Fill #0 | Status: CP

## 2016-02-07 MED FILL — *HYDROMORPHON 4MG: 7 days supply | Qty: 42 | Fill #0 | Status: CP

## 2016-02-07 MED FILL — *AMLODIPINE 5MG: 30 days supply | Qty: 30 | Fill #0 | Status: CP

## 2016-02-07 NOTE — Progress Notes (Signed)
CC: Patient presents with:  Hip Pain: Left hip pain      HPI: Tina [name] is here for follow up for left hip pain secondary to calcium deposit near the hip joint.  It is a 16 x 14 mm well-circumscribed calcium deposit.  It appears to be more acute and chronic.  She is having severe pain all around the hip and was given a prednisone taper which apparently worked for about 4 days and then as the dosage decreased the pain came back.  She also was given oxycodone but it made her sick and she could not take it.  The pain now is just as bad as it was before she started taking the prednisone taper and she still has 2 days to go.      Re: HPI    ROS: Review of systems filled out by patient and scanned into the medical record. Reviewed by me and all other systems are negative except as noted in HPI.    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 No signs of infection on the right.                Sent for hearing test.   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  oxyCODONE-acetaminophen (PERCOCET) 5-325 MG per tablet, Take 1 tablet by mouth every 4 (four) hours as needed for Pain Max Daily Amount: 6 tablets, Disp: 100 tablet, Rfl: 0  .  omeprazole (PRILOSEC) 20 MG capsule, Take 1 capsule by mouth daily, Disp: 30 capsule, Rfl: 0  .  FLUoxetine (PROZAC) 20 MG capsule, Take 1 capsule by mouth daily, Disp: 30 capsule, Rfl: 11  .  amLODIPine (NORVASC) 5 MG tablet, Take 1 tablet by mouth daily, Disp: 30 tablet, Rfl: 11  .  cholecalciferol (VITAMIN D3) 1000 UNIT tablet, Take 1 tablet by mouth daily, Disp: 30 tablet, Rfl: 11  .  OTHER MEDICATION, Isoflavones (A [place] OTC preparation to treat hot flashes), Disp: , Rfl:   .  HYDROmorphone (DILAUDID) 4 MG tablet, Take 1 tablet by mouth every 4 (four) hours as needed for Pain Max Daily Amount: 24 mg, Disp: 42 tablet, Rfl: 0  .  predniSONE (DELTASONE) 50 MG tablet, Take 1 tablet by mouth daily, Disp: 20 tablet, Rfl: 0    Re: past history, social history, meds and allergies, see also scanned New Patient form for additional info provided by patient    Physical Exam   There were no vitals filed for this visit.  General Appearance: Average size female in acute distress with left buttock and hip pain.  Pain was mostly in the buttock at first but now it is also in the inguinal region.  Alert and oriented x3, pleasant and cooperative  Speech: appropriate  Gait and posture: Antalgic due to left hip pain  Mood: Appropriate  Eyes: Equal, not pinpoint, and reactive to light  Respiratory: No respiratory distress and  normal rate  .  Upper Extremities not applicable  Right:    Left:      Lower Extremities:  Right: Inspection of the right hip does not show any abnormality there is good range of motion without pain.      Left: Inspection of the left hip does not show any abnormality but she has severe pain to palpation around the greater trochanter and posterior to it into the buttock area.  She does not have sciatic pain down her leg.  More recently should be getting pain all the way into her groin as well.  There is pain with range of motion which is limited due to discomfort.  Neurovascular status is intact      Spine: Not applicable  Inspection:  Palpation:  ROM:  SLR:  Strength:  Sensation:  Reflexes:  Atrophy:   Lymph:   Gait/Posture:    Coordination and balance:   Vascular;        Imaging - Available images visualized by me and reports reviewed.  No new x-rays were taken today.    A/P: 60 year old female with two-month history of severe pain in the left hip which seems to be related to a calcium deposit near the superior and posterior aspect of the left acetabulum.  She has not gotten sustained relief from a prednisone taper.    I am going to see if the interventional radiologist can aspirate and inject the area with Xylocaine and Depo-Medrol.  In the meantime I am going to put her on 50 mg of prednisone per day and I gave her a prescription for some Dilaudid.      We reviewed the likely diagnosis, the prognosis, and various treatment options in detail.  Risks and benefits of treatment plan discussed.  Patient's questions have been answered, patient understands condition and agrees with treatment plan.,      .    Vertell Limber, MD, 02/07/2016, 11:21 AM

## 2016-02-07 NOTE — Addendum Note (Signed)
Addended by: Vertell Limber on: 02/07/2016 01:03 PM     Modules accepted: Orders

## 2016-02-07 NOTE — Progress Notes (Signed)
Patient taught how to use crutches by Jerlyn Ly RN under the direction of Dr Bjorn Loser. Patient is to use daily until next follow up appt wit MD.      Call [facility] with any questions or concerns.

## 2016-02-10 ENCOUNTER — Telehealth (HOSPITAL_BASED_OUTPATIENT_CLINIC_OR_DEPARTMENT_OTHER): Payer: Self-pay | Admitting: Registered Nurse

## 2016-02-10 ENCOUNTER — Telehealth (HOSPITAL_BASED_OUTPATIENT_CLINIC_OR_DEPARTMENT_OTHER): Payer: Self-pay | Admitting: Physician Assistant

## 2016-02-10 DIAGNOSIS — M25552 Pain in left hip: Secondary | ICD-10-CM

## 2016-02-10 DIAGNOSIS — M71452 Calcium deposit in bursa, left hip: Principal | ICD-10-CM

## 2016-02-10 DIAGNOSIS — G8929 Other chronic pain: Secondary | ICD-10-CM

## 2016-02-10 MED ORDER — GABAPENTIN 300 MG PO CAPS
300.0000 mg | ORAL_CAPSULE | Freq: Two times a day (BID) | ORAL | 0 refills | Status: DC
Start: 2016-02-10 — End: 2016-02-18

## 2016-02-10 MED ORDER — GABAPENTIN 300 MG PO CAPS: 300 mg | capsule | Freq: Two times a day (BID) | ORAL | 0 refills | 0 days | Status: DC

## 2016-02-10 MED FILL — *GABAPENTIN 300 MG: 30 days supply | Qty: 60 | Fill #0 | Status: CP

## 2016-02-10 NOTE — Progress Notes (Signed)
Voicemail left for patient regarding staff message her meds weren't helping. I followed up to ensure that she understood the plan of care provided by PA as call was dropped. She was told to continue the Prednisone and Dilaudid and pick up the Gabapentin at [facility]. Contact numbers provided if any further questions or problems.

## 2016-02-10 NOTE — Progress Notes (Signed)
[name] interpreter used via telephone.    Patient recently prescribed Dilaudid 4 mg and Prednisone 50 mg tab.  Dr. Bjorn Loser raised med dosage.  Yesterday got very strong pain like a shock.  She is taking the prednisone 50 mg and Dilaudid.  It does reduce her pain level but she still gets severe shock like pains out of nowhere and they make it difficult for her to do things. She could not work today due to pain.    In the past she has taken  Percocet 5-325 mg  Prednisone taper starting at 60 mg  Indomethacin 50 mg  Norco ( hydrocodone-acetaminophen)  Naproxen 500 mg  Ibuprofen 800 mg      She has interventional radiology scheduled tomorrow for aspiration of the calcium deposit in her left hip.  This may resolve her pain.  In the meantime I am going to start her on gabapentin 300 mg BID.  She should continue taking the prednisone and dilaudid.  She asked that the medicine be sent to [facility].  We then lost the connection with her.

## 2016-02-11 ENCOUNTER — Ambulatory Visit (HOSPITAL_BASED_OUTPATIENT_CLINIC_OR_DEPARTMENT_OTHER): Payer: Self-pay | Admitting: Orthopaedic Surgery

## 2016-02-11 DIAGNOSIS — M71452 Calcium deposit in bursa, left hip: Secondary | ICD-10-CM

## 2016-02-11 LAB — XR LARGE JOINT OR BURSA INJECTION/ASPIRATION

## 2016-02-11 MED ORDER — TRIAMCINOLONE ACETONIDE 40 MG/ML IJ SUSP
40.00 mg | Freq: Once | INTRAMUSCULAR | Status: AC
Start: 2016-02-11 — End: 2016-03-12
  Administered 2016-02-11: 40 mg via INTRA_ARTICULAR

## 2016-02-11 MED ORDER — NORMAL SALINE FLUSH 0.9 % IV SOLN
10.00 mL | Freq: Once | INTRAVENOUS | Status: AC
Start: 2016-02-11 — End: ?

## 2016-02-11 MED ORDER — IOHEXOL 300 MG/ML IJ SOLN
1.00 mL | Freq: Once | INTRAMUSCULAR | Status: AC
Start: 2016-02-11 — End: 2016-02-11
  Administered 2016-02-11: 3 mL via INTRA_ARTICULAR

## 2016-02-11 MED ORDER — ROPIVACAINE HCL 5 MG/ML IJ SOLN
1.00 mL | Freq: Once | INTRAMUSCULAR | Status: AC
Start: 2016-02-11 — End: 2016-02-11
  Administered 2016-02-11: 5 mL via INTRA_ARTICULAR

## 2016-02-11 MED ORDER — LIDOCAINE HCL 1 % IJ SOLN
1.00 mL | Freq: Once | INTRAMUSCULAR | Status: AC
Start: 2016-02-11 — End: 2016-02-11
  Administered 2016-02-11: 5 mL via SUBCUTANEOUS

## 2016-02-11 NOTE — Addendum Note (Signed)
Addended by: Oswaldo Milian H on: 02/11/2016 11:28 AM     Modules accepted: Orders, SmartSet

## 2016-02-11 NOTE — Addendum Note (Signed)
Addended by: Jilda Roche on: 02/11/2016 11:04 AM     Modules accepted: Orders

## 2016-02-15 ENCOUNTER — Telehealth (HOSPITAL_BASED_OUTPATIENT_CLINIC_OR_DEPARTMENT_OTHER): Payer: Self-pay | Admitting: Registered Nurse

## 2016-02-15 NOTE — Progress Notes (Signed)
Using [name] telephone interpreter, I left a voicemail for patient regarding staff message stating she had med questions. Patient encouraged to call us back so we can answer her questions. Contact number provided.

## 2016-02-16 ENCOUNTER — Telehealth (HOSPITAL_BASED_OUTPATIENT_CLINIC_OR_DEPARTMENT_OTHER): Payer: Self-pay | Admitting: Registered Nurse

## 2016-02-16 NOTE — Progress Notes (Signed)
Using [name] telephone interpreter, patient states she is continuing to have "violent pain" even with the new medication. She is comfortable at night but does not take the Percocet during the day because she works. She asked for a sooner appointment which I provided. Patient read back date, time and location to ensure understanding. Appointment scheduled at front desk.

## 2016-02-18 ENCOUNTER — Ambulatory Visit (HOSPITAL_BASED_OUTPATIENT_CLINIC_OR_DEPARTMENT_OTHER): Payer: Medicaid Other | Admitting: Rehabilitative and Restorative Service Providers"

## 2016-02-18 ENCOUNTER — Ambulatory Visit (HOSPITAL_BASED_OUTPATIENT_CLINIC_OR_DEPARTMENT_OTHER): Payer: Medicaid Other | Admitting: Physician Assistant

## 2016-02-18 DIAGNOSIS — M71452 Calcium deposit in bursa, left hip: Principal | ICD-10-CM

## 2016-02-18 DIAGNOSIS — M5432 Sciatica, left side: Secondary | ICD-10-CM

## 2016-02-18 DIAGNOSIS — G8929 Other chronic pain: Secondary | ICD-10-CM

## 2016-02-18 DIAGNOSIS — M25552 Pain in left hip: Secondary | ICD-10-CM

## 2016-02-18 MED ORDER — GABAPENTIN 300 MG PO CAPS
300.0000 mg | ORAL_CAPSULE | Freq: Three times a day (TID) | ORAL | 2 refills | Status: DC
Start: 2016-03-03 — End: 2016-07-17

## 2016-02-18 MED ORDER — GABAPENTIN 300 MG PO CAPS: 300 mg | capsule | Freq: Three times a day (TID) | ORAL | 2 refills | 0 days | Status: DC

## 2016-02-18 NOTE — Progress Notes (Signed)
Orthopedic Office Note    CC: Patient presents with:  Follow Up: Left Hip      HPI: Tina [name] is a 60 year old female who presents for follow-up of L hip pain, buttock pain.  Taking dilaudid at night it makes her dizzy. Takes tylenol during the day.   Also taking the gabapentin. It has reduced the intensity of the "shock."  Painful to sit on left buttocks.  If cross left leg over right it hurts in left buttocks.  Previously it hurt no matter what.    She had IR aspiration/steriod injection into L hip on 02/11/16, but I am unable to see a brief op note.  The pt reports her pain has improved.  Works as International aid/development worker.  Has taken week off/easy since having the IR aspiration/injection.      ROS: No recent F/C/N/V, CP, SOB, cough, abd pain, weight loss, headache, dizziness, vision loss, hearing loss, numbness, paresthesias, weakness, rash.  Reviewed by me and all other systems are negative except as noted in HPI.    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 No signs of infection on the right.                Sent for hearing test.   No date: S/P appendectomy  No date: S/P laparoscopic procedure      Comment: Exploratory    Review of Patient's Allergies indicates:   Pravachol               Nausea Only, Other (See Comments)    Comment:Malaise      Current Outpatient Prescriptions on File Prior to Visit:  gabapentin (NEURONTIN) 300 MG capsule Take 1 capsule by mouth 2 (two) times daily Disp: 60 capsule Rfl: 0   predniSONE (DELTASONE) 50 MG tablet Take 1 tablet by mouth daily Disp: 20 tablet Rfl: 0   oxyCODONE-acetaminophen (PERCOCET) 5-325 MG per tablet Take 1 tablet by mouth every 4 (four) hours as needed for Pain Max Daily Amount: 6 tablets Disp: 100 tablet Rfl: 0    FLUoxetine (PROZAC) 20 MG capsule Take 1 capsule by mouth daily Disp: 30 capsule Rfl: 11   amLODIPine (NORVASC) 5 MG tablet Take 1 tablet by mouth daily Disp: 30 tablet Rfl: 11   cholecalciferol (VITAMIN D3) 1000 UNIT tablet Take 1 tablet by mouth daily Disp: 30 tablet Rfl: 11   OTHER MEDICATION Isoflavones (A [place] OTC preparation to treat hot flashes) Disp:  Rfl:      Current Facility-Administered Medications on File Prior to Visit:  sodium chloride 0.9 % flush 10-100 mL 10-100 mL Intra-articular Once in imaging [facility]    triamcinolone acetonide (KENALOG-40) injection 40 mg 40 mg Intra-articular Once in imaging [facility] 40 mg at 02/11/16 1221         IMAGING: x-ray of the left hip and AP pelvis show the calcification at greater tuberosity.           PHYSICAL EXAM:  GENERAL: Alert and oriented, in no acute distress  MOOD: Appropriate.   HENT: NCAT, sclera anicteric, EOMI.  RESP: Breathing unlabored.  MUSCULOSKELETAL:  Lower Extremities:   Left LE:  No obvious deformity.  No edema, erythema, ecchymosis, or effusion.  Skin intact.  + moderate TTP proximal aspect of left greater trochanter, +TTP left PSIS and SI joint, +TTP.   Thigh and calf SNT.  Full AROM of digits, ankle, knee and hip.  No pain with IR or ER of L hip.  No pain with logrolling.  Strength 5/5 throughout.   Sensation intact throughout.  DP/PT pulses 2+/2.  Digits WWP.  Right LE:  No obvious deformity.  No edema, erythema, ecchymosis, or effusion.  Skin intact.  No TTP.  Thigh and calf SNT.  Full AROM of digits, ankle, knee and hip.  Strength 5/5 throughout.  Sensation intact throughout.  DP/PT pulses 2+/2.  Digits WWP.  Gait/Posture: Antalgic gait.  When she ambulates she keeps her LLE stiff.   Coordination and balance: grossly normal        ASSESSMENT/PLAN: 59 year old female with chronic left hip pain and calcium deposit in left GT bursa.  She had IR aspiration/steroid injection of left hip joint a week ago.  This plus the  addition of gabapentin have helped improve her pain. She is moving around and appears much more comfortable than when I first met her at her visit with Dr. Bjorn Loser on 02/07/16.  The injection may take a couple more weeks to take full effect.  I am going to increase her gabapentin dose to 300 mg TID to see if this improves the shock sensation even more.  I suspect she has a component of left sided sciatica too and the gabapentin will help with this.  She has an appointment with Dr. Bjorn Loser in 2.5 weeks. We can re-assess her pain at that time.  If no further improvement or it worsens we may want to consider MRI of hip and/or lumbar spine.    We reviewed the likely diagnosis, the prognosis, and various treatment options in detail. Risks and benefits of treatment plan discussed. The patient's questions have been answered, and the patient understands and agrees with treatment plan.     Patient was seen independently today in a PA clinic.       Janetta Hora, PA-C, 02/18/2016, 10:51 AM

## 2016-02-28 ENCOUNTER — Emergency Department (HOSPITAL_BASED_OUTPATIENT_CLINIC_OR_DEPARTMENT_OTHER)
Admission: RE | Admit: 2016-02-28 | Disposition: A | Discharge status: Home / Self Care | Payer: Self-pay | Source: Emergency Department | Attending: Emergency Medicine | Admitting: Emergency Medicine

## 2016-02-28 ENCOUNTER — Ambulatory Visit (HOSPITAL_BASED_OUTPATIENT_CLINIC_OR_DEPARTMENT_OTHER): Payer: Medicaid Other | Admitting: Internal Medicine

## 2016-02-28 ENCOUNTER — Encounter (HOSPITAL_BASED_OUTPATIENT_CLINIC_OR_DEPARTMENT_OTHER): Payer: Self-pay

## 2016-02-28 VITALS — BP 120/79 | Wt 158.2 lb

## 2016-02-28 DIAGNOSIS — M25552 Pain in left hip: Principal | ICD-10-CM

## 2016-02-28 LAB — XR PELVIS AP 1 OR 2 VIEWS

## 2016-02-28 LAB — XR HIP LEFT MINIMUM 2 VIEWS

## 2016-02-28 MED ORDER — KETOROLAC TROMETHAMINE 60 MG/2ML IM SOLN
60.0000 mg | Freq: Once | INTRAMUSCULAR | Status: AC
Start: 2016-02-28 — End: 2016-02-28
  Administered 2016-02-28: 60 mg via INTRAMUSCULAR
  Filled 2016-02-28: qty 2

## 2016-02-28 NOTE — Progress Notes (Signed)
SUBJECTIVE:    [name] is a 60 y.o who presents to clinic with c/c of acute left hip pain, onset last night    Hx of chronic left hip pain, calcium deposits of bursa of left hip, left sciatic notch pain    Has received IR aspiration and steroid injection of her left hip 929/17 with f/u with Orthopedics 02/18/16    Is currently taking Gabapentin and Dilaudid for chronic hip pain    Voices concerns of increase in her hip pain today, states the pain is a " shocking pain" and is very severe    Pain increases with sitting, ambulation.     Only has taken Tylenol 1000 mg today for her pain     Does not like taking Dilaudid, states this medication make her feel dizzy    Continues to take Gabapentin, states this medication has not helped her hip pain    Denies recent falls,trauma or injury    OBJECTIVE:  General: A+0x3, is very uncomfortable, crying  Hip exam - left hip with tenderness on palpation with little palpation. Pt is guarding her left hip on exam due to her hip pain. Unable to sit down due to her pain        ASSESSMENT/PLAN  (M25.552) Acute pain of left hip  (primary encounter diagnosis)  Comment: presents to clinic with acute severe left hip pain  Will send to ED, may need to be treated with Toradol IM of Morphine IV  Plan:  Call made to [facility] ED, report given  Pt transferred to ED via wheelchair.   Scheduled to see Orthopedics 03/06/16    Pt declines to have PE today due to her acute hip pain and will schedule an appt for PE with this provider

## 2016-02-28 NOTE — ED Notes (Signed)
Bed: 01B  Expected date: 02/28/16  Expected time: 2:44 PM  Means of arrival:   Comments:  Tina [name], [place] [birth year] - 20F with Chronic left hip pain with bursal calcium deposits.  Getting cortisone injections until recently.  Also recently on dilaudid and gabapentin.  Now having severe acute hip pain in clinic. Now in agony. Only took tylenol today. Dilaudid made her dizzy in past.

## 2016-02-28 NOTE — ED Triage Note (Signed)
Patient presents here today with persistent atraumatic left hip pain. Has been seen and treated by orthopedic with an injection 31 days ago. Pain has been 10/10.

## 2016-02-28 NOTE — ED Patient Condition Note (Signed)
Patient condition: Stable

## 2016-02-28 NOTE — Narrator Note (Signed)
Patient Disposition    Patient education for diagnosis, medications, activity, diet and follow-up.  Patient left ED 4:46 PM.  Patient rep received written instructions.  Interpreter to provide instructions: Yes    Patient belongings with patient: YES    Have all existing LDAs been addressed? N/A    Have all IV infusions been stopped? N/A    Discharged to: Discharged to home instructions reviewed.

## 2016-02-28 NOTE — Discharge Instructions (Signed)
You were seen in the emergency department for left hip pain.  An x-ray of your left hip did not reveal any concerning findings.  You were given a shot of medicine (ketorolac) in the emergency room with improvement in your pain.  Please take Tylenol and ibuprofen as needed for pain.  Please follow-up with your primary care doctor to make sure symptoms continue to improve or resolve    Call your doctor or return to the emergency room if you develop fevers, swelling of your left hip, or any other concerning symptoms

## 2016-02-28 NOTE — ED Provider Notes (Signed)
The patient was seen primarily by me. ED nursing record was reviewed. Select prior records as available electronically through the Epic record were reviewed.    History, physical exam and disposition were conducted with an official hospital [place] interpreter.      HPI:    Tina [name] is a 60 year old female patient who presents for evaluation of left hip pain.  The patient reports she has been having chronic left hip pain for at least 3 months.  Over the last several days she feels as if the pain in her left hip has worsened.  She reports a stabbing pain over the lateral hip.  It is worse with palpation of her hip as well as movement.  She denies any trauma or falls.  She denies any fevers or chills.  She has been able to ambulate although has been having worsening pain.  Her chronic hip pain is been treated with steroid injections in the past.  Her last injection was about a month ago.  She was seen in her PCP office today and sent to the ED due to worsening pain.  She denies any weakness or numbness in her left leg.  She denies any swelling in her leg.    ROS: Pertinent positives were reviewed as per the HPI above.    Specifically, no fevers or chills, no headache, no vision changes, no chest pain, no shortness of breath, no abdominal pain, no nausea or vomiting or diarrhea, no black or bloody stools, no hematuria or dysuria, no vaginal discharge or bleeding    All other systems were reviewed and are negative.    Macky Lower  Language of care: [place]  MRN: [id number]  PCP: [name], APRN  Mode of arrival to ED: Relative.  Arrival time: 02/28/2016  2:55 PM  Chief complaint: Hip Pain (HIP PAIN)    Past Medical History/Problem list:  Past Medical History:  No date: Arthritis  No date: H/o Condyloma      Comment: 2003 (vulvar)  No date: HTN (hypertension)  No date: Hypercholesteremia  No date: Hyperopia  No date: Presbyopia  06/17/2010: Right ear pain      Comment: Seen by ENT. Can't  hear from left ear from old               surgery. No signs of infection on the right.                Sent for hearing test.   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    Past Surgical History: Past Surgical History:  2000: APPENDECTOMY      Comment: laparoscopy  9/04: ENDOMETRIAL BX W/WO ENDOCERVIX BX W/O DILAT SPX      Comment: endometrial bx = negative  No date: HYSTEROSCOPY BX ENDOMETRIUM&/POLYPC W/WO D&C  Comment: 3 times in [place]  No date: OB ANTEPARTUM CARE CESAREAN DLVR & POSTPARTUM      Comment: tubal ligation  Social History: Alcohol:No  Tobacco:No  Cocaine/Heroin/Methamphetamine:No   Allergies: Review of Patient's Allergies indicates:   Pravachol               Nausea Only, Other (See Comments)    Comment:Malaise    Immunizations:   Immunization History   Administered Date(s) Administered   . Depo Provera 01/02/2005, 04/24/2008, 07/10/2008, 09/04/2008, 10/16/2008, 11/13/2008   . H1N1 0.61ml W/ Preserv 3 & > 07/10/2008   . HEP B ADULT 3 DOSE 20 and > 04/09/2015, 05/07/2015   . INFLUENZA VIRUS TRI W/PRESV VACCINE 18/> YRS IM (PRIVATE) 03/19/2009, 01/28/2010, 03/22/2011, 03/12/2012   . Influenza Virus Quad Presv Free Vacc 3/> Yrs IM 05/27/2014   . Influenza Virus Quadrivalent Vacc 3/> Yrs Im 03/13/2013   . Td 04/20/2003   . Tdap 01/13/2014     Family Hx: no history of significant inheritable disease,   CAD: No  DM: No     Triage Vital Signs:   ED Triage Vitals [02/28/16 1515]  BP: 131/74  Pulse: 78  Resp: 14  Temp: 97.4 F  Temp src: n/a  SpO2: 97  %  Weight: 71.7 kg  Height: n/a  Head Circumference: n/a  Peak Flow: n/a  Pain Score: 10   Pain Loc: n/a  Pain Edu?: n/a  Excl. in [place]?: n/a    Medications:  Prior to Admission Medications   Prescriptions Last Dose Informant Patient Reported? Taking?   FLUoxetine (PROZAC) 20 MG capsule   No No   Sig: Take 1 capsule by mouth daily   OTHER MEDICATION   Yes No   Sig: Isoflavones (A [place] OTC preparation to treat hot flashes)   amLODIPine (NORVASC) 5 MG tablet   No No   Sig: Take 1 tablet by mouth daily   cholecalciferol (VITAMIN D3) 1000 UNIT tablet   No No   Sig: Take 1 tablet by mouth daily   gabapentin (NEURONTIN) 300 MG capsule   No No   Sig: Take 1 capsule by mouth 3 (three) times daily   predniSONE (DELTASONE) 50 MG tablet   No No   Sig: Take 1 tablet by mouth daily      Facility-Administered Medications: None     Physical Exam:   GENERAL: No acute distress, non-toxic     HEAD: No evidence of trauma.   Sclerae anicteric.   Normal posterior oropharynx. Moist mucous membranes.   Pupils equal & reactive, extraocular movements intact.     NECK: Supple. No stridor.    LUNGS: Clear to auscultation bilaterally. No wheezes, rales, rhonchi.     HEART: Regular rate & rhythm.  No murmurs appreciated.      ABDOMEN: Soft, nontender. No masses appreciated.  No involuntary guarding or rebound tenderness.     MUSCULOSKELETAL/EXTREMITIES: Full ROM of the left hip, tenderness on palpation of the left lateral hip. Knee nontender and ankle nontender with full ROM. 2+ DP and PT pulses. Pt is able to bear weight on the LLE. No swelling or edema of the LLE. No obvious deformities or trauma.  Warm and well perfused.      SKIN: Warm and dry, no rash    GENITOURINARY: No CVA tenderness.     NEUROLOGIC: Alert and oriented x3; moves all extremities well; speaking in clear fluent sentences. Normal gait without ataxia. Sensation intact to light touch throughout.  PSYCHIATRIC: Appropriate, cooperative.       Medications Given in the  ED:  Medications   ketorolac (TORADOL) injection 60 mg (60 mg Intramuscular Given 02/28/16 1548)    Radiology Results:  XRAY L hip/pelvis:  IMPRESSION:    1. Left hip: Degenerative changes of the left hip, unchanged prior    examination.    2. Pelvis: Mild degenerative changes of the right hip and lower lumbar    spine, unchanged.    Lab Results :  No results found for this visit on 02/28/16 (from the past 24 hour(s)). Other Results (e.g. ECG):  N/A     ED Course and Medical Decision-making:  60 year old female patient presenting to the ED with left hip pain. On exam she is afebrile with tenderness over lateral aspect of her hip and full ROM of the L hip joint. XR did not reveal any fracture, the patient does have degenerative changes. She was given a shot of Toradal in the ED with significant improvement in her pain. On re-evaluation she was able to ambulate and bear weight on the LLE without difficulty. I suspect her symptoms are likely from arthritis in her hip vs bursitis. The patient was instructed to take ibuprofen as needed for pain and to follow up with her PCP. She expressed understanding and agreement with this plan. Discharged home in improved condition.        Patient educated on diagnosis; she states understanding and agrees with plan of care.  Reasons to return to the ED were reviewed in detail. She agrees with this plan and disposition.      Diagnosis/Diagnoses:  Left hip pain    Discharge Prescriptions:  Discharge Medication List as of 02/28/2016  4:28 PM          Christin Bach, MD  02/28/2016   Dept of Emergency [place]    This Emergency Department patient encounter note was created using voice-recognition software and in real time during the ED visit. Please excuse any typographical errors that have not yet been reviewed and corrected.

## 2016-03-01 MED FILL — *GABAPENTIN 300 MG: 30 days supply | Qty: 90 | Fill #0 | Status: CP

## 2016-03-02 MED FILL — *AMLODIPINE 5MG: 30 days supply | Qty: 30 | Fill #1 | Status: CP

## 2016-03-06 ENCOUNTER — Ambulatory Visit (HOSPITAL_BASED_OUTPATIENT_CLINIC_OR_DEPARTMENT_OTHER): Payer: Medicaid Other | Admitting: Orthopaedic Surgery

## 2016-03-07 ENCOUNTER — Ambulatory Visit (HOSPITAL_BASED_OUTPATIENT_CLINIC_OR_DEPARTMENT_OTHER): Payer: Medicaid Other | Admitting: Orthopaedic Surgery

## 2016-03-07 DIAGNOSIS — M71452 Calcium deposit in bursa, left hip: Secondary | ICD-10-CM

## 2016-03-07 MED ORDER — DICLOFENAC SODIUM 1 % TD GEL: 4 g | g | Freq: Two times a day (BID) | 1 refills | 0 days | Status: AC

## 2016-03-07 MED ORDER — DICLOFENAC SODIUM 50 MG PO TBEC
50.00 mg | DELAYED_RELEASE_TABLET | Freq: Two times a day (BID) | ORAL | 1 refills | Status: AC
Start: 2016-03-07 — End: 2016-05-06

## 2016-03-07 MED ORDER — DICLOFENAC SODIUM 50 MG PO TBEC: 50 mg | tablet | Freq: Two times a day (BID) | ORAL | 1 refills | 0 days | Status: AC

## 2016-03-07 MED ORDER — DICLOFENAC SODIUM 1 % TD GEL
4.00 g | Freq: Two times a day (BID) | TRANSDERMAL | 1 refills | Status: AC
Start: 2016-03-07 — End: 2016-05-06

## 2016-03-07 MED ORDER — OMEPRAZOLE 20 MG PO CPDR: 20 mg | capsule | Freq: Every day | ORAL | 1 refills | 0 days | Status: AC

## 2016-03-07 MED ORDER — OMEPRAZOLE 20 MG PO CPDR
20.00 mg | DELAYED_RELEASE_CAPSULE | Freq: Every day | ORAL | 1 refills | Status: AC
Start: 2016-03-07 — End: 2016-04-06

## 2016-03-07 MED FILL — VOLTAREN   GEL 1%: 12 days supply | Qty: 100 | Fill #0 | Status: CP

## 2016-03-07 MED FILL — DICLOFENAC 50MG DR: 30 days supply | Qty: 60 | Fill #0 | Status: CP

## 2016-03-07 MED FILL — *OMEPRAZOLE   20MG: 30 days supply | Qty: 30 | Fill #0 | Status: CP

## 2016-03-07 NOTE — Progress Notes (Signed)
CC: Patient presents with:  Follow Up: Left hip      HPI: Tina [name] is here for follow up for calcific tendinitis about the left hip.  She has had an injection in the left hip joint which only helped a little bit and a prednisone taper which did help somewhat.  She says the gabapentin made her feel little funny and she believes is given her some diarrhea so she is going to stop taking it.  Overall she is much better today than she was last time I saw her.  However she still has the same pain just posterior to her left hip and slightly superior to the sciatic notch.  It does correlate with a calcium deposit on the x-ray.      Re: HPI    ROS: Review of systems filled out by patient and scanned into the medical record. Reviewed by me and all other systems are negative except as noted in HPI.    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 No signs of infection on the right.                Sent for hearing test.   No date: S/P appendectomy  No date: S/P laparoscopic procedure      Comment: Exploratory    Surgical HX: Past Surgical History:  2000: APPENDECTOMY      Comment: laparoscopy  9/04: ENDOMETRIAL BX W/WO ENDOCERVIX BX W/O DILAT SPX      Comment: endometrial bx = negative  No date: HYSTEROSCOPY BX ENDOMETRIUM&/POLYPC W/WO D&C      Comment: 3 times in [place]  No date: OB ANTEPARTUM CARE CESAREAN DLVR & POSTPARTUM      Comment: tubal ligation    SH:   Social History  Social History   Marital status: Married  Spouse name: [name]    Years of education: N/A  Number of children: 4     Occupational History  Babysitting SELF EMPLO Works daytime     Social History Main Topics   Smoking status: Former Smoker     Smokeless tobacco: Never Used    Alcohol use Yes     Comment: Occasionally    Drug use: No    Sexual activity: Yes    Partners: Male    Patent examiner protection: Surgical     Other Topics Concern   None on file     Social History Narrative    Immigrated from [place] in 2003       FH: Noncontributory    Allergies: Review of Patient's Allergies indicates:   Pravachol               Nausea Only, Other (See Comments)    Comment:Malaise    Current Medications:   Current Outpatient Prescriptions:   .  gabapentin (NEURONTIN) 300 MG capsule, Take 1 capsule by mouth 3 (three) times daily, Disp: 90 capsule, Rfl: 2  .  FLUoxetine (PROZAC) 20 MG capsule, Take 1 capsule by mouth daily, Disp: 30 capsule, Rfl: 11  .  amLODIPine (NORVASC) 5 MG tablet, Take 1 tablet by mouth daily,  Disp: 30 tablet, Rfl: 11  .  cholecalciferol (VITAMIN D3) 1000 UNIT tablet, Take 1 tablet by mouth daily, Disp: 30 tablet, Rfl: 11  .  OTHER MEDICATION, Isoflavones (A [place] OTC preparation to treat hot flashes), Disp: , Rfl:   .  diclofenac (VOLTAREN) 1 % GEL Gel, Apply 4 g topically 2 (two) times daily Do Not Exceed 32 g in 24 Hours, Disp: 100 g, Rfl: 1  .  diclofenac (VOLTAREN) 50 MG EC tablet, Take 1 tablet by mouth 2 (two) times daily, Disp: 60 tablet, Rfl: 1  .  omeprazole (PRILOSEC) 20 MG capsule, Take 1 capsule by mouth daily, Disp: 30 capsule, Rfl: 1  No current facility-administered medications for this visit.     Facility-Administered Medications Ordered in Other Visits:   .  sodium chloride 0.9 % flush 10-100 mL, 10-100 mL, Intra-articular, Once in imaging, [facility]  .  triamcinolone acetonide (KENALOG-40) injection 40 mg, 40 mg, Intra-articular, Once in imaging, [facility], 40 mg at 02/11/16 1221    Re: past history, social history, meds and allergies, see also scanned New Patient form for additional info provided by patient    Physical Exam   There were no vitals filed for this visit.  General Appearance: Average size female in no acute distress  Alert and oriented x3, pleasant  and cooperative  Speech: appropriate  Gait and posture: Limps slightly because of left buttock pain  Mood: Appropriate  Eyes: Equal, not pinpoint, and reactive to light  Respiratory: No respiratory distress and normal rate  .  Upper Extremities not applicable  Right:    Left:      Lower Extremities:  Right: Inspection of the right hip does not show any significant abnormality.  She has full range of motion without pain and there is no tenderness to palpation      Left: Inspection of the left hip does not show any abnormality.  Range of motion causes pain in the groin on internal rotation but does not duplicate the pain she gets in her buttock.  She has tenderness to palpation around the left buttock but it does not radiate down the left leg and there is no sciatic component.  There is no paresthesias in the left leg      Spine: Not applicable  Inspection:  Palpation:  ROM:  SLR:  Strength:  Sensation:  Reflexes:  Atrophy:   Lymph:   Gait/Posture:    Coordination and balance:   Vascular;        Imaging - Available images visualized by me and reports reviewed.     A/P: 60 year old female with calcium deposit around the left hip and only mild help from an injection into the hip joint.  I am going to put her on some omeprazole, diclofenac pills 50 mg p.o. twice daily, and some diclofenac gel to rub over the sciatic notch area where she is tender.  She will return in 1 month.  If she has not made much improvement I will get an MRI scan of her pelvis      We reviewed the likely diagnosis, the prognosis, and various treatment options in detail.  Risks and benefits of treatment plan discussed.  Patient's questions have been answered, patient understands condition and agrees with treatment plan.,      .    Vertell Limber, MD, 03/07/2016, 4:56 PM

## 2016-04-01 MED FILL — *OMEPRAZOLE   20MG: 30 days supply | Qty: 30 | Fill #1 | Status: CP

## 2016-04-01 MED FILL — *AMLODIPINE 5MG: 30 days supply | Qty: 30 | Fill #2 | Status: CP

## 2016-04-03 ENCOUNTER — Other Ambulatory Visit (HOSPITAL_BASED_OUTPATIENT_CLINIC_OR_DEPARTMENT_OTHER): Payer: Self-pay

## 2016-04-03 ENCOUNTER — Telehealth (HOSPITAL_BASED_OUTPATIENT_CLINIC_OR_DEPARTMENT_OTHER): Payer: Self-pay

## 2016-04-03 ENCOUNTER — Ambulatory Visit (HOSPITAL_BASED_OUTPATIENT_CLINIC_OR_DEPARTMENT_OTHER): Payer: Medicaid Other | Admitting: Internal Medicine

## 2016-04-03 DIAGNOSIS — E559 Vitamin D deficiency, unspecified: Principal | ICD-10-CM

## 2016-04-03 MED ORDER — CHOLECALCIFEROL 25 MCG (1000 UT) PO TABS
1000.0000 [IU] | ORAL_TABLET | Freq: Every day | ORAL | 11 refills | Status: DC
Start: 2016-04-03 — End: 2017-04-09

## 2016-04-03 MED ORDER — CHOLECALCIFEROL 1000 UNITS PO TABS: 1000 [IU] | tablet | Freq: Every day | ORAL | 11 refills | 0 days | Status: DC

## 2016-04-03 MED FILL — VITAMIN D3 1000UNIT: 30 days supply | Qty: 30 | Fill #0 | Status: CP

## 2016-04-03 NOTE — Progress Notes (Signed)
[facility] INTERNAL MED    Person calling on behalf of patient: Patient (self)    May list multiple medications in this section    Medicine Name: cholecalciferol (VITAMIN D3) 1000 UNIT tablet    Dosage:     Frequency (how many pills, how many times a day):     Number of pills left:     Documented patient preferred pharmacies:       [facility]  Phone: [phone] Fax: [phone]

## 2016-04-03 NOTE — Progress Notes (Signed)
pcp out sick, appt for today was cancelled. tel phone call with [place] interpreters, left vm to call [facility] back to r/s

## 2016-04-03 NOTE — Progress Notes (Signed)
PER Patient (self), Tina [name] is a 60 year old female has requested a refill of Vit D 1000 units.      Last Office Visit: 02/28/16 with [name]  Last Physical Exam: 01/13/14      Other Med Adult:  Most Recent BP Reading(s)  02/28/16 : 131/74          Cholesterol (mg/dL)   Date Value   03/24/2015 251 (*H)   ----------    LOW DENSITY LIPOPROTEIN DIRECT (mg/dL)   Date Value   03/24/2015 175   ----------    HIGH DENSITY LIPOPROTEIN (mg/dL)   Date Value   03/24/2015 51   ----------    TRIGLYCERIDES (mg/dL)   Date Value   03/24/2015 171 (H)   ----------        THYROID SCREEN TSH REFLEX FT4 (uIU/mL)   Date Value   03/16/2014 1.640   ----------        TSH (THYROID STIM HORMONE) (uIU/mL)   Date Value   09/23/2010 1.52   ----------    No results found for: HGBA1C    No results found for: POCA1C      No results found for: INR      SODIUM (mmol/L)   Date Value   03/19/2015 140   ----------      POTASSIUM (mmol/L)   Date Value   03/19/2015 4.1   ----------          CREATININE (mg/dL)   Date Value   03/19/2015 0.8   ----------      Documented patient preferred pharmacies:      [facility]  Phone: [phone] Fax: [phone]

## 2016-04-04 ENCOUNTER — Telehealth (HOSPITAL_BASED_OUTPATIENT_CLINIC_OR_DEPARTMENT_OTHER): Payer: Self-pay | Admitting: Registered Nurse

## 2016-04-04 ENCOUNTER — Telehealth (HOSPITAL_BASED_OUTPATIENT_CLINIC_OR_DEPARTMENT_OTHER): Payer: Self-pay | Admitting: Licensed Practical Nurse

## 2016-04-04 MED FILL — VOLTAREN   GEL 1%: 30 days supply | Qty: 100 | Fill #1 | Status: CP

## 2016-04-04 NOTE — Progress Notes (Signed)
Confirmed with [place] Voltaren gel and Diclofenac are covered through [place]. PA is not required.

## 2016-04-04 NOTE — Progress Notes (Signed)
Prior authorization request for Diclofenac 1% Gel and Diclofenac 50 MG EC tablet  Clinical Indication: calcific tendinitis about the left hip  Previous medications tried: Percocet, Dilaudid, Indocin, Flexoril, Norco  Diagnostic testing information: Xrays    Membership ID (insurance): MEDICAID     Plan: MEDICAID LIMITED Member: [id number] Effective from: 02/11/2016   Subscriber: Tina [name], Tina [name] Subscriber ID: [id number] Guarantor: Tina [name]     Plan: HEALTH SAFETY NET SECONDARY Member: [id number] Subscriber: Tina [name], Tina [name]   Subscriber ID: [id number] Guarantor: Tina [name]

## 2016-04-04 NOTE — Progress Notes (Signed)
Prior authorization needed for diclofenac gel 1%, please initiate .

## 2016-04-07 MED FILL — FLUOXETINE   20MG: 30 days supply | Qty: 30 | Fill #0 | Status: CP

## 2016-04-14 ENCOUNTER — Ambulatory Visit (HOSPITAL_BASED_OUTPATIENT_CLINIC_OR_DEPARTMENT_OTHER): Payer: Medicaid Other | Admitting: Orthopaedic Surgery

## 2016-05-01 MED FILL — VITAMIN D3 1000UNIT: 30 days supply | Qty: 30 | Fill #1 | Status: CP

## 2016-05-01 MED FILL — *AMLODIPINE 5MG: 30 days supply | Qty: 30 | Fill #3 | Status: CP

## 2016-05-01 MED FILL — *FLUOXETINE   20MG: 30 days supply | Qty: 30 | Fill #1 | Status: CP

## 2016-06-01 ENCOUNTER — Other Ambulatory Visit (HOSPITAL_BASED_OUTPATIENT_CLINIC_OR_DEPARTMENT_OTHER): Payer: Self-pay | Admitting: Internal Medicine

## 2016-06-01 DIAGNOSIS — I10 Essential (primary) hypertension: Principal | ICD-10-CM

## 2016-06-01 MED FILL — *AMLODIPINE 5MG: 30 days supply | Qty: 30 | Fill #4 | Status: CP

## 2016-06-01 MED FILL — *FLUOXETINE   20MG: 30 days supply | Qty: 30 | Fill #2 | Status: CP

## 2016-06-01 MED FILL — VITAMIN D3 1000UNIT: 30 days supply | Qty: 30 | Fill #2 | Status: CP

## 2016-06-06 NOTE — Progress Notes (Signed)
Pt calling for medication

## 2016-07-01 MED FILL — *AMLODIPINE  5MG: 30 days supply | Qty: 30 | Fill #5 | Status: CP

## 2016-07-01 MED FILL — VITAMIN D3 1000UNIT: 30 days supply | Qty: 30 | Fill #3 | Status: CP

## 2016-07-01 MED FILL — FLUOXETINE 20MG CAPS: 30 days supply | Qty: 30 | Fill #3 | Status: CP

## 2016-07-17 ENCOUNTER — Ambulatory Visit (HOSPITAL_BASED_OUTPATIENT_CLINIC_OR_DEPARTMENT_OTHER): Payer: Medicaid Other | Admitting: Orthopaedic Surgery

## 2016-07-17 ENCOUNTER — Telehealth (HOSPITAL_BASED_OUTPATIENT_CLINIC_OR_DEPARTMENT_OTHER): Payer: Self-pay

## 2016-07-17 DIAGNOSIS — G8929 Other chronic pain: Secondary | ICD-10-CM

## 2016-07-17 DIAGNOSIS — M5432 Sciatica, left side: Secondary | ICD-10-CM

## 2016-07-17 DIAGNOSIS — M71452 Calcium deposit in bursa, left hip: Secondary | ICD-10-CM

## 2016-07-17 DIAGNOSIS — M25552 Pain in left hip: Secondary | ICD-10-CM

## 2016-07-17 MED ORDER — GABAPENTIN 300 MG PO CAPS: 300 mg | capsule | Freq: Three times a day (TID) | ORAL | 2 refills | 0 days | Status: DC

## 2016-07-17 MED ORDER — OXYCODONE-ACETAMINOPHEN 5-325 MG PO TABS
1.0000 | ORAL_TABLET | ORAL | 0 refills | Status: AC | PRN
Start: 2016-07-17 — End: 2016-08-11

## 2016-07-17 MED ORDER — GABAPENTIN 300 MG PO CAPS
300.0000 mg | ORAL_CAPSULE | Freq: Three times a day (TID) | ORAL | 2 refills | Status: DC
Start: 2016-07-17 — End: 2016-10-31

## 2016-07-17 MED ORDER — OXYCODONE-ACETAMINOPHEN 5-325 MG PO TABS: 1 | tablet | ORAL | 0 refills | 0 days | Status: AC | PRN

## 2016-07-17 MED FILL — *OXYCOD/APAP 5-325MG: 16 days supply | Qty: 100 | Fill #0 | Status: CP

## 2016-07-17 MED FILL — *GABAPENTIN 300 MG: 30 days supply | Qty: 90 | Fill #0 | Status: CP

## 2016-07-17 NOTE — Progress Notes (Unsigned)
Call patient for Dr. Bjorn Loser to tell her that the prescription were sent to [facility].

## 2016-07-17 NOTE — Progress Notes (Signed)
CC: Patient presents with:  Follow Up  Hip Pain: left hip pain      HPI: Tina [name] is here for follow up for left buttock pain.  She had severe pain in the left buttock and groin area back in September and was given an injection into the hip joint on September 29.  That did seem to work very well and she had been on Neurontin and diclofenac gel which was keeping it under control.  She felt mostly with the Neurontin.  However recently it has been flared up again and gives her intermittent very sharp pain which is difficult for her to control.    Her hip really does not look that bad but there is a calcium deposit just posterior to the greater trochanter and she is very tender there.  I was a little surprised that the hip joint injection helped that much since her hip looks good and it seems to be more the calcium deposit.      Re: HPI    ROS: Review of systems filled out by patient and scanned into the medical record. Reviewed by me and all other systems are negative except as noted in HPI.    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 No signs of infection on the right.                Sent for hearing test.   No date: S/P appendectomy  No date: S/P laparoscopic procedure      Comment: Exploratory    Surgical HX: Past Surgical History:  2000: APPENDECTOMY      Comment: laparoscopy  9/04: ENDOMETRIAL BX W/WO ENDOCERVIX BX W/O DILAT SPX      Comment: endometrial bx = negative  No date: HYSTEROSCOPY BX ENDOMETRIUM&/POLYPC W/WO D&C      Comment: 3 times in [place]  No date: OB ANTEPARTUM CARE CESAREAN DLVR & POSTPARTUM      Comment: tubal ligation    SH:   Social History  Social History   Marital status: Married  Spouse name: [name]    Years of education:  N/A  Number of children: 4     Occupational History  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  oxyCODONE-acetaminophen (PERCOCET) 5-325 MG per tablet, Take 1 tablet by mouth every  4 (four) hours as needed for Pain Max Daily Amount: 6 tablets, Disp: 100 tablet, Rfl: 0  .  gabapentin (NEURONTIN) 300 MG capsule, Take 1 capsule by mouth 3 (three) times daily, Disp: 90 capsule, Rfl: 2  .  cholecalciferol (VITAMIN D3) 1000 UNIT tablet, Take 1 tablet by mouth daily, Disp: 30 tablet, Rfl: 11  .  FLUoxetine (PROZAC) 20 MG capsule, Take 1 capsule by mouth daily, Disp: 30 capsule, Rfl: 11  .  amLODIPine (NORVASC) 5 MG tablet, Take 1 tablet by mouth daily, Disp: 30 tablet, Rfl: 11  .  OTHER MEDICATION, Isoflavones (A [place] OTC preparation to treat hot flashes), Disp: , Rfl:   No current facility-administered medications for this visit.     Facility-Administered Medications Ordered in Other Visits:   .  sodium chloride 0.9 % flush 10-100 mL, 10-100 mL, Intra-articular, Once in imaging, Frances Maywood    Re: past history, social history, meds and allergies, see also scanned New Patient form for additional info provided by patient    Physical Exam   There were no vitals filed for this visit.  General Appearance: Slightly overweight female in no acute distress  Alert and oriented x3, pleasant and cooperative  Speech: appropriate  Gait and posture: Limping because of pain in the  right posterior hip and buttock area  Mood: Appropriate  Eyes: Equal, not pinpoint, and reactive to light  Respiratory: No respiratory distress and normal rate  .  Upper Extremities not applicable  Right:    Left:      Lower Extremities:  Right: Inspection of the right lower extremity does not show any abnormality there is good range of motion of the hip and the knee without pain.  Neurovascular status is intact.      Left: Inspection of the left hip does not show any abnormality.  She has good range of motion of the hip and external rotation but gets a lot of pain posterior to the greater trochanter and in the buttock area with internal rotation.  Neurovascular status is intact.  She only gets the pain with hip and knee flexed to 90 and then internally rotating when her hip is out straight on the exam table you internally rotated does not seem to hurt very much.      Spine: Not applicable  Inspection:  Palpation:  ROM:  SLR:  Strength:  Sensation:  Reflexes:  Atrophy:   Lymph:   Gait/Posture:    Coordination and balance:   Vascular;        Imaging - Available images visualized by me and reports reviewed.  No new x-rays were taken today    A/P: 61 year old female 61 year old female with a chronic left hip pain which becomes acute frequently.  And hip joint injection helped her a lot last time so we are going to reschedule that.  However her x-rays do not show a lot of arthritis so I am going to order an MRI scan of her pelvis and hips to see exactly what is going on and what role this calcification in the posterior aspect of her hip area might be playing      We reviewed the likely diagnosis, the prognosis, and various treatment options in detail.  Risks and benefits of treatment plan discussed.  Patient's questions have been answered, patient understands condition and agrees with treatment plan.,      .    Vertell Limber, MD, 07/17/2016, 4:14 PM

## 2016-07-19 ENCOUNTER — Ambulatory Visit (HOSPITAL_BASED_OUTPATIENT_CLINIC_OR_DEPARTMENT_OTHER): Payer: Self-pay | Admitting: Orthopaedic Surgery

## 2016-07-19 DIAGNOSIS — M71452 Calcium deposit in bursa, left hip: Secondary | ICD-10-CM

## 2016-07-21 ENCOUNTER — Ambulatory Visit (HOSPITAL_BASED_OUTPATIENT_CLINIC_OR_DEPARTMENT_OTHER): Payer: Self-pay | Admitting: Internal Medicine

## 2016-07-21 LAB — XR LARGE JOINT OR BURSA INJECTION/ASPIRATION

## 2016-07-21 MED ORDER — IOHEXOL 300 MG/ML IJ SOLN
1.00 mL | Freq: Once | INTRAMUSCULAR | Status: AC
Start: 2016-07-21 — End: 2016-07-21
  Administered 2016-07-21: 3 mL via INTRA_ARTICULAR

## 2016-07-21 MED ORDER — LIDOCAINE HCL 1 % IJ SOLN
1.00 mL | Freq: Once | INTRAMUSCULAR | Status: AC
Start: 2016-07-21 — End: 2016-07-21
  Administered 2016-07-21: 4 mL via SUBCUTANEOUS

## 2016-07-21 MED ORDER — TRIAMCINOLONE ACETONIDE 40 MG/ML IJ SUSP
40.00 mg | Freq: Once | INTRAMUSCULAR | Status: AC
Start: 2016-07-21 — End: 2016-08-20
  Administered 2016-07-21: 40 mg via INTRA_ARTICULAR

## 2016-07-21 MED ORDER — ROPIVACAINE HCL 5 MG/ML IJ SOLN
1.00 mL | Freq: Once | INTRAMUSCULAR | Status: AC
Start: 2016-07-21 — End: 2016-07-21
  Administered 2016-07-21: 4 mL via INTRA_ARTICULAR

## 2016-07-21 NOTE — Addendum Note (Signed)
Addended by: Hetty Ely on: 07/21/2016 11:11 AM     Modules accepted: Orders, SmartSet

## 2016-07-21 NOTE — Addendum Note (Signed)
Addended by: JONES, YVETTE on: 07/21/2016 10:54 AM     Modules accepted: Orders

## 2016-07-24 ENCOUNTER — Encounter (HOSPITAL_BASED_OUTPATIENT_CLINIC_OR_DEPARTMENT_OTHER): Payer: Self-pay | Admitting: Internal Medicine

## 2016-07-24 ENCOUNTER — Ambulatory Visit (HOSPITAL_BASED_OUTPATIENT_CLINIC_OR_DEPARTMENT_OTHER): Payer: Medicaid Other | Admitting: Internal Medicine

## 2016-07-24 VITALS — BP 122/64 | HR 68 | Temp 98.2°F | Wt 160.0 lb

## 2016-07-24 DIAGNOSIS — I10 Essential (primary) hypertension: Secondary | ICD-10-CM

## 2016-07-24 DIAGNOSIS — Z23 Encounter for immunization: Secondary | ICD-10-CM

## 2016-07-24 DIAGNOSIS — E785 Hyperlipidemia, unspecified: Secondary | ICD-10-CM

## 2016-07-24 DIAGNOSIS — Z1212 Encounter for screening for malignant neoplasm of rectum: Secondary | ICD-10-CM

## 2016-07-24 DIAGNOSIS — G8929 Other chronic pain: Secondary | ICD-10-CM

## 2016-07-24 DIAGNOSIS — H43393 Other vitreous opacities, bilateral: Principal | ICD-10-CM

## 2016-07-24 DIAGNOSIS — M25552 Pain in left hip: Secondary | ICD-10-CM

## 2016-07-24 DIAGNOSIS — Z1211 Encounter for screening for malignant neoplasm of colon: Secondary | ICD-10-CM

## 2016-07-24 LAB — LIPID PANEL
Cholesterol: 259 mg/dL (ref 0–239)
HIGH DENSITY LIPOPROTEIN: 55 mg/dL (ref 40–?)
LOW DENSITY LIPOPROTEIN DIRECT: 179 mg/dL (ref 0–189)
TRIGLYCERIDES: 134 mg/dL (ref 0–150)

## 2016-07-24 NOTE — Progress Notes (Signed)
SUBJECTIVE:    Mrs. 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 Reports receiving a cortisone injection last week. Is also scheduled to have a MRI per Orthopedics next week.   Has been taking Gabapentin 300 mg TID with some effect  Also has a Rx for Percocet, tries to avoid taking this medication, states it causes her to have  dizziness     Desires to see Ophthalmology secondary to seeing visual floaters. Reports noticing black spots in her visual field. Denies headaches, vision loss, eye pain, blurred vision    Hx of hyperlipidemia, is due for repeat labs  Previously took Pravachol which was d'cd due to pt complaint of fatigue      OBJECTIVE:  General: A+0x3,NAD  BP 122/64  Pulse 68  Temp 98.2 F (36.8 C) (Oral)  Wt 72.6 kg (160 lb)  LMP 06/08/2008  SpO2 97%  BMI 28.35 kg/m2   HEENT: NT/AT PERRLA, no conjunctivial injection noted, Nares paten TMs intact, no perforation, erythema  or effusion noted, Throat clear,MMM, no exudate noted.  NECK: supple, no nodules, negative lymphadenopathy  CHEST: Clear to auscultation, no wheezes or rales.  CV: RRR, S1, S2 WNL, no murmurs    ASSESSMENT/PLAN  (H43.393) Floaters in visual field, bilateral  (primary encounter diagnosis)  Comment: with hx of HTN  Will refer to Ophthalmology  Plan: [place] ( INT)         (I10) Essential hypertension  Comment:   Plan: BP stable    (Z12.11,  Z12.12) Screening for colorectal cancer  Comment:   Plan: POC IMMUNOASSAY FECAL OCCULT BLOOD TEST           (E78.5) Hyperlipidemia LDL goal < 130  Comment:  Plan: LIPID PANEL, COLLECTION VENOUS BLOOD         VENIPUNCTURE           (Z23) Need for prophylactic vaccination and inoculation against viral hepatitis  Comment: will need to complete Hepatitis B vaccine series  Plan: IMMUNIZATION ADMIN SINGLE, RN, PR HEPB VACCINE          ADULT 3 DOSE SCHEDULE FOR IM USE, IMMUNIZATION         ADMIN EACH ADD, RN            (Z23) Need for prophylactic vaccination and inoculation against influenza  Comment:   Plan: PR IIV4 VACC SPLIT VIRUS 0.5 ML DOS FOR IM USE         (M25.552,  G89.29) Chronic left hip pain  Comment: scheduled to have MRI per Orthopedics  encouraged pt to keep her f/u appt with Orthopedics   and scheduled MRI. Pt will discuss plan of care with Orthopedics  Plan:      I have spent 25 minutes in face to face time with this patient/patient proxy of which > 50% was in counseling or coordination of care regarding above issues/Dx.

## 2016-07-24 NOTE — Progress Notes (Signed)
07/24/2016  VIS given prior to administration and reviewed with the patient and or legal guardian. Patient understands the disease and the vaccine. See immunization/Injection module or chart review for date of publication and additional information.  Melodie Ashworth K. Davy Westmoreland, LPN

## 2016-07-28 ENCOUNTER — Ambulatory Visit (HOSPITAL_BASED_OUTPATIENT_CLINIC_OR_DEPARTMENT_OTHER): Payer: Self-pay | Admitting: Orthopaedic Surgery

## 2016-07-28 DIAGNOSIS — M71452 Calcium deposit in bursa, left hip: Secondary | ICD-10-CM

## 2016-07-28 NOTE — Addendum Note (Signed)
Addended by: Remi Haggard on: 07/28/2016 04:35 PM     Modules accepted: Orders

## 2016-07-31 ENCOUNTER — Ambulatory Visit (HOSPITAL_BASED_OUTPATIENT_CLINIC_OR_DEPARTMENT_OTHER): Payer: Medicaid Other | Admitting: Orthopaedic Surgery

## 2016-07-31 DIAGNOSIS — M25552 Pain in left hip: Secondary | ICD-10-CM

## 2016-07-31 MED FILL — FLUOXETINE 20MG CAPS: 30 days supply | Qty: 30 | Fill #4 | Status: CP

## 2016-07-31 MED FILL — VITAMIN D3 1000UNIT: 30 days supply | Qty: 30 | Fill #4 | Status: CP

## 2016-07-31 MED FILL — *AMLODIPINE  5MG: 30 days supply | Qty: 30 | Fill #6 | Status: CP

## 2016-07-31 NOTE — Progress Notes (Signed)
CC: Patient presents with:  Follow Up: Left Hip and Pelvis MRI Review      HPI: Tina [name] is here for follow up for left hip pain.  She had a hip joint injection 9 days ago and had an MRI scan a few days ago.  She said the hip pain gave her complete relief for a few hours and then it got worse for 2 days and then it got much better almost completely better for 2 days and now the pain is coming back.    Re: HPI    ROS: Review of systems filled out by patient and scanned into the medical record. Reviewed by me and all other systems are negative except as noted in HPI.    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 No signs of infection on the right.                Sent for hearing test.   No date: S/P appendectomy  No date: S/P laparoscopic procedure      Comment: Exploratory    Surgical HX: Past Surgical History:  2000: APPENDECTOMY      Comment: laparoscopy  9/04: ENDOMETRIAL BX W/WO ENDOCERVIX BX W/O DILAT SPX      Comment: endometrial bx = negative  No date: HYSTEROSCOPY BX ENDOMETRIUM&/POLYPC W/WO D&C      Comment: 3 times in [place]  No date: OB ANTEPARTUM CARE CESAREAN DLVR & POSTPARTUM      Comment: tubal ligation    SH:   Social History  Social History   Marital status: Married  Spouse name: [name]    Years of education: N/A  Number of children: 4     Occupational History  Babysitting SELF EMPLO Works daytime     Social History Main Topics   Smoking status: Former Smoker     Smokeless tobacco: Never Used    Alcohol use Yes    Comment: Occasionally    Drug use: No    Sexual activity: Yes    Partners: Male    Patent examiner protection: Surgical     Other Topics Concern   None on file     Social History Narrative    Immigrated from [place] in 2003        FH: Noncontributory    Allergies: Review of Patient's Allergies indicates:   Pravachol               Nausea Only, Other (See Comments)    Comment:Malaise    Current Medications:   Current Outpatient Prescriptions:   .  oxyCODONE-acetaminophen (PERCOCET) 5-325 MG per tablet, Take 1 tablet by mouth every 4 (four) hours as needed for Pain Max Daily Amount: 6 tablets, Disp: 100 tablet, Rfl: 0  .  gabapentin (NEURONTIN) 300 MG capsule, Take 1 capsule by mouth 3 (three) times daily, Disp: 90 capsule, Rfl: 2  .  cholecalciferol (VITAMIN D3) 1000 UNIT tablet, Take 1 tablet by mouth daily, Disp: 30 tablet, Rfl: 11  .  FLUoxetine (PROZAC) 20 MG capsule, Take 1 capsule by mouth daily, Disp: 30 capsule, Rfl: 11  .  amLODIPine (NORVASC) 5 MG tablet, Take 1 tablet by mouth daily, Disp: 30 tablet, Rfl: 11  .  OTHER MEDICATION, Isoflavones (A [place] OTC preparation to treat hot flashes), Disp: , Rfl:   No current facility-administered medications for this visit.     Facility-Administered Medications Ordered in Other Visits:   .  triamcinolone acetonide (KENALOG-40) injection 40 mg, 40 mg, Intra-articular, Once in imaging, Lavone Nian, MD, 40 mg at 07/21/16 1152  .  sodium chloride 0.9 % flush 10-100 mL, 10-100 mL, Intra-articular, Once in imaging, Frances Maywood    Re: past history, social history, meds and allergies, see also scanned New Patient form for additional info provided by patient    Physical Exam   There were no vitals filed for this visit.  General Appearance: Slightly overweight female in no acute distress  Alert and oriented x3, pleasant and cooperative  Speech: appropriate  Gait and posture: Limping because of left hip pain  Mood: Appropriate  Eyes: Equal, not pinpoint, and reactive to light  Respiratory: No respiratory distress and normal rate  .  Upper Extremities not applicable  Right:    Left:      Lower Extremities:  Right: Inspection of the right hip does not show any abnormality.  There is good  range of motion of the right hip without pain and neurovascular status is intact.      Left: Inspection of the left hip does not show any abnormality.  There is some mild tenderness to palpation over the trochanteric bursa but is not severe.  She has pain mostly with range of motion in particular flexion and internal rotation of the left hip.  This gives her severe pain.  Neurovascular status is intact.      Spine: Not applicable  Inspection:  Palpation:  ROM:  SLR:  Strength:  Sensation:  Reflexes:  Atrophy:   Lymph:   Gait/Posture:    Coordination and balance:   Vascular;        Imaging - Available images visualized by me and reports reviewed.  MRI scan was reviewed and does show some degenerative change in the hip and probable labral tear.  She also has a calcium deposit in the rectus femoris anterior to the hip joint and some fluid and edema around the quadratus femoris.    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  She has had 2 injections in her hip joint which gave her temporary relief.  She uses meloxicam and oxycodone at present.    I explained to her that I think she might benefit from a hip arthroscopy to evaluate her hip joint and perhaps alleviate the labral problem.  If she does not get good relief from the arthroscopic procedure she would probably need a hip replacement.  I referred her to [facility] to see 1 of the people there who does hip arthroscopy.      We reviewed the likely diagnosis, the prognosis, and various treatment options in detail.  Risks and benefits of treatment plan discussed.  Patient's questions have been answered, patient understands condition and agrees with treatment plan.,      .    Vertell Limber, MD, 07/31/2016, 3:25 PM

## 2016-08-01 ENCOUNTER — Telehealth (HOSPITAL_BASED_OUTPATIENT_CLINIC_OR_DEPARTMENT_OTHER): Payer: Self-pay | Admitting: Internal Medicine

## 2016-08-01 DIAGNOSIS — E785 Hyperlipidemia, unspecified: Secondary | ICD-10-CM

## 2016-08-01 NOTE — Progress Notes (Signed)
Call made to pt  Discussed lipid panel results  Desires to see a Nutritionist  Diet modifications discussed with pt  Will repeat lipid panel in 6 months  Pt agreed to plan

## 2016-08-02 LAB — MRI HIPS WO CONTRAST

## 2016-08-04 ENCOUNTER — Other Ambulatory Visit (HOSPITAL_BASED_OUTPATIENT_CLINIC_OR_DEPARTMENT_OTHER): Payer: Self-pay | Admitting: Orthopaedic Surgery

## 2016-08-04 DIAGNOSIS — M25552 Pain in left hip: Secondary | ICD-10-CM

## 2016-08-17 ENCOUNTER — Ambulatory Visit (HOSPITAL_BASED_OUTPATIENT_CLINIC_OR_DEPARTMENT_OTHER): Payer: Medicaid Other | Admitting: Registered"

## 2016-08-30 ENCOUNTER — Other Ambulatory Visit (HOSPITAL_BASED_OUTPATIENT_CLINIC_OR_DEPARTMENT_OTHER): Payer: Self-pay | Admitting: Internal Medicine

## 2016-08-30 DIAGNOSIS — I10 Essential (primary) hypertension: Principal | ICD-10-CM

## 2016-08-30 MED FILL — *GABAPENTIN 300 MG: 30 days supply | Qty: 90 | Fill #1 | Status: CP

## 2016-08-30 MED FILL — FLUOXETINE 20MG CAPS: 30 days supply | Qty: 30 | Fill #5 | Status: CP

## 2016-08-30 MED FILL — VITAMIN D3 1000UNIT: 30 days supply | Qty: 30 | Fill #5 | Status: CP

## 2016-08-30 MED FILL — *AMLODIPINE  5MG: 30 days supply | Qty: 30 | Fill #0 | Status: CP

## 2016-08-30 NOTE — Progress Notes (Signed)
PER Pharmacy, Tina [name] is a 60 year old female has requested a refill of AMLODIPINE 5 MG TAB.      Last Office Visit: 07/24/2016 with [name]   Last Physical Exam: 01/13/2014      HTN Med:    Most Recent BP Reading(s)  07/24/16 : 122/64  02/28/16 : 131/74  02/28/16 : 120/79      Documented patient preferred pharmacies:    [facility]  Phone: [phone] Fax: [phone]

## 2016-09-25 ENCOUNTER — Ambulatory Visit (HOSPITAL_BASED_OUTPATIENT_CLINIC_OR_DEPARTMENT_OTHER): Payer: Medicaid Other | Admitting: Internal Medicine

## 2016-09-30 MED FILL — *GABAPENTIN 300 MG: 30 days supply | Qty: 90 | Fill #2 | Status: CP

## 2016-09-30 MED FILL — VITAMIN D3 1000UNIT: 30 days supply | Qty: 30 | Fill #6 | Status: CP

## 2016-09-30 MED FILL — FLUOXETINE 20MG CAPS: 30 days supply | Qty: 30 | Fill #6 | Status: CP

## 2016-09-30 MED FILL — *AMLODIPINE  5MG: 30 days supply | Qty: 30 | Fill #1 | Status: CP

## 2016-10-02 ENCOUNTER — Ambulatory Visit (HOSPITAL_BASED_OUTPATIENT_CLINIC_OR_DEPARTMENT_OTHER): Payer: Medicaid Other | Admitting: Ophthalmology

## 2016-10-02 ENCOUNTER — Ambulatory Visit (HOSPITAL_BASED_OUTPATIENT_CLINIC_OR_DEPARTMENT_OTHER): Payer: PRIVATE HEALTH INSURANCE

## 2016-10-02 ENCOUNTER — Encounter (HOSPITAL_BASED_OUTPATIENT_CLINIC_OR_DEPARTMENT_OTHER): Payer: Self-pay | Admitting: Ophthalmology

## 2016-10-02 DIAGNOSIS — H524 Presbyopia: Secondary | ICD-10-CM

## 2016-10-02 DIAGNOSIS — H52203 Unspecified astigmatism, bilateral: Principal | ICD-10-CM

## 2016-10-02 DIAGNOSIS — H5203 Hypermetropia, bilateral: Principal | ICD-10-CM

## 2016-10-02 NOTE — Nursing Note (Signed)
Annual eye exam, f/u Hyperopia, Presbyopia.  Sees floaters. No flashes.  Sensitivity to light.

## 2016-10-02 NOTE — Progress Notes (Signed)
Tina [name] was seen in the [facility]  for a complaint of decreased near vision.    She was examined and refracted and would benefit from glasses for hyperopia, astigmatism, and presbyopia.    She was given an eye glass prescription which they can get filled at the optical shop of their choice.    Options including over- the- counter readers, single vision readers, bifocals, and progressive add lenses were discussed with patient.    She  was instructed to follow prn/3 years.

## 2016-10-03 ENCOUNTER — Ambulatory Visit (HOSPITAL_BASED_OUTPATIENT_CLINIC_OR_DEPARTMENT_OTHER): Payer: Medicaid Other | Admitting: Physical Therapy

## 2016-10-03 ENCOUNTER — Ambulatory Visit (HOSPITAL_BASED_OUTPATIENT_CLINIC_OR_DEPARTMENT_OTHER): Payer: Self-pay | Admitting: Physical Therapy

## 2016-10-03 DIAGNOSIS — M25552 Pain in left hip: Principal | ICD-10-CM

## 2016-10-03 DIAGNOSIS — G8929 Other chronic pain: Principal | ICD-10-CM

## 2016-10-03 NOTE — Progress Notes (Signed)
Outpatient Physical Therapy Initial Evaluation  [facility]: [place]    Patient: Tina [name] , DOB February 21, 1956  Primary Language: [name]  Interpreter Needed: yes  Occurrence Code: 11  ICD-10: Chronic left hip pain [M25.552, G89.29]  Date of Onset: 1 year ago  Mechanism: insiduous  Progression: slight improvement    History of Present Illness: Tina [name] a 61 year old female who complains pain in her left buttocks since past 1 year. She has been taking Gabapentin which decreases her pain. "When I am in a lot of pain, I feel paralyzed". She had a hip injection in March which helped. Patient expresses long term goal of pain, is motivated to work towards this in [place].     Patient learns best by: demonstration, verbal instructions, written instructions.    Patient Regis Bill is aware of attendance policy: Yes  Plan of care discussed with Patient/Family: Yes  Patient goals reviewed and incorporated in plan of care: Yes  Patient/Family agrees with plan of care: Yes  Patient/Family education: Yes  Does patient feel safe at home: Yes    Imaging: Radiographs of the left hip demonstrate:   Exam Date: 07/28/16    Exam: MRI HIPS NON CONTRAST    Reason for Exam: hip pain      Addendum: Impression and findings are as follows follows:      There is an area of low signal in intensity in T1 and T2 weighted sequence in the left rectus femoris muscle corresponding to the calcification in the left rectus femoris muscle as noted in the x-ray.. This is compatible with known calcific tendinopathy of the    rectus femoris. There is also mild edema and increased signal is noted in the quadratus femoris muscle (series 4 image 10; series 8 image 8) which can be seen with ischiofemoral impingement.      IMPRESSION:      1. 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    2. Mild edema in the quadratus femoris which can be seen with ischiofemoral impingement.      MRI PELVIS, LEFT hip    HISTORY:  Left hip pain, response to multiple injections COMPARISON: X-ray 02/28/2016    TECHNIQUE: MRI of the pelvis and hips with 1.5T Siemens magnet. Coronal STIR, coronal T1W, coronal T2W FS, axial PD, axial T2W FS, sagittal T2W FS and 3D GRE sequences.      FINDINGS: Bony structures: There is normal marrow signal. No marrow edema or contusion. There is mild joint space narrowing. The femoral head,    acetabulum show normal signal intensity. No evidence of avascular necrosis, fracture or bony lesion. The sacrum, iliac bone, right hip are unremarkable. Sacroiliac joints are grossly normal.    Articular structures: The alignment of the hip joint is normal. There are no erosions. Suggestion of mild joint space narrowing. There is small cartilage defect in the acetabular cartilage. There is physiologic amount of fluid within the joint space. There is a linear    T2 hyperintensity in the posterior inferior labrum which may represent a small labral tear. No loose bodies.      Soft tissue structures: The muscles and ligaments and tendons surrounding the hip joint are unremarkable. No inflammatory change,mass or fluid collection. Soft tissue calcification noted in the prior    study is not fully appreciated in the current study.      There is a low signal lesion within the uterine myometrium corresponding to the intramural uterine fibroid. Remainder  of the uterus is unremarkable. No adnexal masses. Several small nabothian    cysts at the cervix. Bladder outline is normal. Visualized small and large bowel loops are unremarkable.      IMPRESSION:      1. Mild degenerative changes of the left hip with a small cartilage defect and suggestion of a small labral tear in the posterior inferior labrum.    2. Soft tissue calcification noted in the CT scan x-ray is not fully appreciated on this study.     Exam Date: 02/28/16    Exam: HIP, LEFT MIN 2 VIEWS; PELVIS AP 1 OR 2 VIEWS    Reason for Exam: left hip pain     INDICATION: Left hip pain    TECHNIQUE: 2 views of the left hip; standing frontal radiograph the pelvis      COMPARISON: None available      FINDINGS: Left hip: There are osteophytes along the left hip joint. There is a prominent ossification along the anterolateral aspect of the acetabulum and measures 16 x 12 mm and is unchanged from prior examination. There are some osteophytes along the left greater    trochanter. There is no acute fracture or dislocation. Pelvis: The sacroiliac joints and pubic symphysis are intact. There are osteophytes along the right hip. There are small osteophytes along the lower lumbar spine. There is no acute fracture or dislocation.      IMPRESSION:    1. Left hip: Degenerative changes of the left hip, unchanged prior examination.    2. Pelvis: Mild degenerative changes of the right hip and lower lumbar spine, unchanged.         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 Often will not be able to push the stroller when in pain.   Driving: ok   Mobility Status/Stairs: ok   Sleeping: "I have had nights when I wake up screaming in pain"   Recreational Activities: ok   Fitness Activities: none    Additional Eval Findings   Supine to long sit: WNL  Pelvic alignment: no malalignment in supine or WB  Slump -ve  SLR -ve  FABER +ve with left buttocks pain       10/03/16 1500   Language Information   Language  of Care [place]   Interpreter Yes   Name telephonic   Evaluation Type   Evaluation Type Initial Evaluation   Rehab Discipline   Rehab Discipline PT   Visit   Visit number 0   POC Due date 4 weeks from 5/22   Pain   Pain Assessment (Thermometer Scale) No Pain (0)   Precautions   Precautions No   Weight Bearing Status   RLE FWB   LLE FWB   Posture   R Elevated Shoulder  Moderate   Lumbar Assessment   Overall Lumbar ROM WNL w/o symptoms   Strength LLE   L Hip Extension 3+/5  (pain)   L Hip ABduction 5/5  L Knee Flexion 3+/5  (pain)   L Knee Extension 4/5   L Hip Internal Rotation 5/5   L Hip External Rotation 5/5   L Hip Flexion 4+/5   AROM RLE (degrees)   Overall AROM WNL hip and knee ROM   Strength RLE   Overall Strength glute max 4-/5, rest 5/5   Functional Mobility   Gait WNL   Assistive devices None   Transfers WNL   Balance Excellent   Palpation   Tenderness to Palpation left ischial tuberosity and buttocks   Increased Tissue Density none   Left Hip    L Hip Anterior 3/6   L Hip Posterior 3/6   L Hip Inferior 3/6   L Hip Lateral 3/6   Reflexes   RLE Normal reflexes   LLE Normal reflexes   Patient Education   What was taught? current condition, PT POC, ice pack for pain relief   Method Verbal   Patient comprehension Yes     Tina [name], PT, Lic # [id number]                 Physical Therapy Plan of Care    PCP:  [name], APRN    Referring Provider:   Bettey Costa  [address]  [place], [place] [address]    Patient Active Problem List:  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     Chronic left hip pain      Medications:     Current  Outpatient Prescriptions on File Prior to Visit:  amLODIPine (NORVASC) 5 MG tablet TAKE 1 TABLET BY MOUTH EVERY DAY Disp: 30 tablet Rfl: 1   gabapentin (NEURONTIN) 300 MG capsule Take 1 capsule by mouth 3 (three) times daily Disp: 90 capsule Rfl: 2   cholecalciferol (VITAMIN D3) 1000 UNIT tablet Take 1 tablet by mouth daily Disp: 30 tablet Rfl: 11   FLUoxetine (PROZAC) 20 MG capsule Take 1 capsule by mouth daily Disp: 30 capsule Rfl: 11   OTHER MEDICATION Isoflavones (A [place] OTC preparation to treat hot flashes) Disp:  Rfl:      Current Facility-Administered Medications on File Prior to Visit:  sodium chloride 0.9 % flush 10-100 mL 10-100 mL Intra-articular Once in imaging [facility]       Diagnosis: Chronic left hip pain [M25.552, G89.29]    Assessment/Objective Findings: Tina [name] is a 61 year old year old female who presents to [place] outpatient physical therapy with complaints of chronic pain in her left hip and buttocks. On examination, the patient demonstrates: pain, tenderness along left ischial tuberosity and buttocks, decreased left gluteus max and hamstring muscle strength.    She has good lumbar spine and hip joint mobility. There are no peripheral or local symptoms reproduces with lower limb tension test. However, severe buttocks pain is reported with FABER on the left side. There is no pain or tenderness at the hip joint line. As  per MD note from 07/31/2016, her symptoms at that time were conssitent with acetabular impingement and labral tear. Clinical presentation today, in conjuntion with imaging are consistent with labral tear and buttocks pain. These problems limit the patient with the following functional activities: push baby stroller, sleep.       Patient will benefit from skilled PT focused on pain management, manual therapy including soft tissue mobilization or Graston Technique, therapeutic exercises including strength training and stretching manuevers, TENS, patient education  and HEP instruction for self-management to address the above mentioned  concerns and in order to return to prior level of function. The prescribed treatment plan of care is medically necessary to address afore mentioned impairments and return to maximal level of function. Barriers to treatment including comorbidities of hypertension, chronic tension headaches, vitamin D deficiency and personal factors of sedentary lifestyle were identified and taken into considerations of plan of care. The rehabilitation potential for this patient is good with an stable clinical presentation due to above listed barriers, functional limitations, and clinical exam.    Short Term Functional Goals: 2 weeks.      Patient will demonstrate decreased tenderness over left buttocks upon palpation.   Patient will be independent with initial HEP.    Long Term Goal: 4 weeks.      Patient will demonstrate improved LLE strength to 5/5.   Patient will report pain reduction from 10/10 to 1-3/10 at worst, in order to improve quality of sleep.   Patient will push a baby stroller for 15-20 minutes with no increase in buttocks pain.   Patient will demonstrate home exercises with no errors, in order to transition to independent HEP.     Treatment Plan: ** PT Eval - Low Complexity (CPT 97161)  ** PT Re-Eval (CPT 97164)  ** Stretching/ROM Exercise [phone])  ** Therapeutic Exercise (CPT 97110)  ** Home Exercise Program (CPT [phone])  ** Neuromuscular Re-education (CPT [id number])  ** Soft Tissue Mobilization (CPT 97140)  ** Ultrasound (CPT 97035)  ** Hot/Cold Rx (CPT 97010)  ** Gait Training (CPT [id number])  ** Patient Education (CPT [phone])    Recommendations: Physical Therapy be continued 1 times per week for 4-6 weeks, after which a reassessment for future physical therapy needs will occur.     The frequency and duration may be tapered or increased as treatment progresses based on the therapist's judgment of factors, including but not limited to,  co-morbidities, tissue healing, patient/caregiver independent self management, the ability to participate in/attend/receive therapy based on the patient's medical stability and competing care or life priorities.    Tina [name], BPT, MSPT, DPT   Staff Physical Therapist   [facility]   Phone: [place], [place], Lic # [id number]

## 2016-10-04 NOTE — Progress Notes (Signed)
I certify that the documented Treatment Plan is reasonable and necessary.    10/12/2014  Mitchell Epling, MD

## 2016-10-16 ENCOUNTER — Telehealth (HOSPITAL_BASED_OUTPATIENT_CLINIC_OR_DEPARTMENT_OTHER): Payer: Self-pay

## 2016-10-16 NOTE — Progress Notes (Signed)
.  This is the eye center calling. Your glasses are here and ready for pick up. [facility] is open Monday though Thursday, from 8:30 to 4:30 pm. It is closed for lunch from 12:30 to 1:30 pm. Please call our office at [phone] if you have any questions.

## 2016-10-18 ENCOUNTER — Ambulatory Visit (HOSPITAL_BASED_OUTPATIENT_CLINIC_OR_DEPARTMENT_OTHER): Payer: Medicaid Other | Admitting: Social Worker

## 2016-10-18 DIAGNOSIS — G8929 Other chronic pain: Secondary | ICD-10-CM

## 2016-10-18 DIAGNOSIS — M25552 Pain in left hip: Principal | ICD-10-CM

## 2016-10-18 NOTE — Progress Notes (Signed)
S: Pt reports no pain currently, notes pain woke her up early this morning and she was unable to get back to sleep, had to take ibprofen.   O: see rehab flow sheet  A: Pt with L posterior innominate rotation, corrected with MET, pelvic alignment WNL at end of tx today.  Pt with intermittent pain in glutes during tx today needing to pause tx to wait for pain to pass, <1 min, pt reports 10/10 pain with these episodes.  Pt with good overall execution of therex today, no increases in pain.  Handout provided with therex done today, pt verbalized and demonstrated understanding  P: cont per PT POC  Tyron Russell, PTA, Lic # [id number]

## 2016-10-18 NOTE — Progress Notes (Signed)
10/18/16 0800   Language Information   Language of Care [place] Yes   Name pacific - [place]   Precautions   Precautions No   Weight Bearing Status   RLE FWB   LLE FWB   Rehab Discipline   Rehab Discipline PT   Visit   Visit number 1   POC Due date 4 weeks from 5/22   Pain   Pain Assessment (Thermometer Scale) No Pain (0)   Manual Therapy   Technique MET for pelvic rotation L   Time in minutes 2   Manual Therapy 2   Technique IAST stick roller glute, HS, quad, ITB   Time in minutes 8   Ther Exercise   Exercise bridge   Reps 10   Sets 2   Ther Exercise 2   Exercise HS stretch c strap   Holds 2 60"   Ther Exercise 3   Exercise 3 piriformis stretch   Holds 3 60"   Ther Exercise 4   Exercise 4 L SKTC   Holds 4 60"   Ther Exercise 5   Exercise 5 s/l clams L only   Reps 5 10   Sets 5 2   Patient Education   What was taught? clams, stretches, bridge   Method Written;Demo;Practice;Verbal   Patient comprehension Yes     Tyron Russell, PTA, Lic # [id number]

## 2016-10-19 ENCOUNTER — Ambulatory Visit (HOSPITAL_BASED_OUTPATIENT_CLINIC_OR_DEPARTMENT_OTHER): Payer: Self-pay | Admitting: Ophthalmology

## 2016-10-25 ENCOUNTER — Ambulatory Visit (HOSPITAL_BASED_OUTPATIENT_CLINIC_OR_DEPARTMENT_OTHER): Payer: Medicaid Other | Admitting: Social Worker

## 2016-10-27 ENCOUNTER — Ambulatory Visit (HOSPITAL_BASED_OUTPATIENT_CLINIC_OR_DEPARTMENT_OTHER): Payer: Medicaid Other | Admitting: Internal Medicine

## 2016-10-27 ENCOUNTER — Ambulatory Visit (HOSPITAL_BASED_OUTPATIENT_CLINIC_OR_DEPARTMENT_OTHER): Payer: Medicaid Other | Admitting: Physical Therapy

## 2016-10-27 VITALS — BP 116/78 | HR 90 | Temp 97.8°F | Ht 62.99 in | Wt 156.0 lb

## 2016-10-27 DIAGNOSIS — G8929 Other chronic pain: Secondary | ICD-10-CM

## 2016-10-27 DIAGNOSIS — Z124 Encounter for screening for malignant neoplasm of cervix: Secondary | ICD-10-CM

## 2016-10-27 DIAGNOSIS — I10 Essential (primary) hypertension: Secondary | ICD-10-CM

## 2016-10-27 DIAGNOSIS — E785 Hyperlipidemia, unspecified: Secondary | ICD-10-CM

## 2016-10-27 DIAGNOSIS — Z0000 Encounter for general adult medical examination without abnormal findings: Secondary | ICD-10-CM

## 2016-10-27 DIAGNOSIS — M25552 Pain in left hip: Principal | ICD-10-CM

## 2016-10-27 DIAGNOSIS — Z1239 Encounter for other screening for malignant neoplasm of breast: Secondary | ICD-10-CM

## 2016-10-27 NOTE — Patient Instructions (Signed)
[facility]  [facility] - ADULT [facility]  [address]  Primary Care  [place] [address]  Clinical Nutrition Services    Calcium and Your Health    Calcium is a mineral that helps build strong bones and teeth, helps prevent brittle bones (osteoporosis) and hip fractures, and helps maintain a normal blood pressure. It is very important to get enough calcium each day.    * CALCIUM NEEDS AT ALL AGES: (RDI 2000)  Infants birth to 12 months  is 210 -270 mg/day          Child 1-8 years  is 500-.800 mg/day                     Adolescent 9-18years is 1300 mg/day                   Adults 19-30 years is 1000 mg/day  Adults 31-50 years is 1000 mg/day  Adults 51 and older is 1200 mg/day    * TO MAINTAIN GOOD BONE HEALTH, EAT A CALCIUM RICH DIET:    FOODS                                      Yogurt, plain, nonfat: 1 cup serving is 450 mgs of calcium    Ricotta cheese, part skim: 1/2 cup serving is 340 mgs of calcium    Milk, skim: 1 cup serving is 300 mgs of calcium    Milk, whole: 1 cup serving is 290 mgs of calcium    Sardines, canned, with bones: 3 oz serving is 280 mgs of calcium    Yogurt, plain, whole milk:1 cup serving is 275 mgs of calcium    Orange juice, calcium fortified:1 cup serving is 270 mgs of calcium    Swiss cheese: 1 oz serving is 270 mgs of calcium    Spinach, cooked: 1 cup serving is 240 mgs of calcium   Turnip greens, rhubarb, cooked:1 cup serving is 200 mgs of calcium   Salmon, canned, with bones: 3 oz serving is 180 mgs of calcium   Ice Cream: 1 cup serving is 175 mgs of calcium   Pudding, instant mix: 1/2 cup serving is 150 mgs of calcium   Almonds:  /2 cup serving is 150 mgs of calcium   Kale, cooked: 1 cup serving is 100 mgs of calcium   Lobster, cooked: 6 oz serving is 100 mgs of calcium   Tofu, with calcium sulfate:  3oz serving is 100 mgs of calcium     EXERCISE: Weight bearing exercises, such as walking, jogging, racquet sports, aerobics, and weight  training  help make bones stronger.     VITAMIN D: Vitamin D is essential to help your body absorb calcium. A healthy body can make its own vitamin D with the help of sunlight. But in the winter months, you may not get enough sun, and should make   sure you get it from another source, like milk or a multivitamin.     HORMONES: The hormone estrogen helps the female body and bones to use calcium. After menopause women need to discuss hormone treatment with their doctor.    * This is just a first step in helping improve your health.  * For help with a meal plan for you, make an appointment with the Nutritionist.  * For more information,   you can also contact the National Nutrition Hotline 1-800-366-1655                                                              9/95 MAS 1/96, 12/97eqj, 01/02, 4/02

## 2016-10-27 NOTE — Progress Notes (Signed)
S: Patient reports 5/10 shooting pain in her left buttock. She says she was pain-free for a while, but her pain returned last night due to prolonged walking and working which intensified this morening  O: see flowsheet below  A: Patient tolerated the treatment poor as she is in severe pain. Light STM and massage with ice-cube performed over left buttock. Manual lumbar traction not tolerated past 2 minutes due to pain. Patient reports significant pain relief following TENS application.   P: Cont POC and progress as tolerated.      10/27/16 1200   Language Information   Language of Care [place]   Interpreter Patient Declined   Precautions   Precautions No   Weight Bearing Status   RLE FWB   LLE FWB   Rehab Discipline   Rehab Discipline PT   Visit   Visit number 2   POC Due date 4 weeks from 5/22   Pain   Pain Assessment (Thermometer Scale) Moderate Pain (3-5)   Manual Therapy   Manual Therapy Yes   Technique manual lumbar traction   Manual Therapy 2   Technique ice-cube massage to left buttocks   Manual Therapy 3   Technique light STM to left buttocks   Ther Exercise   Therapeutic Exercise? Yes   Exercise Rec Bike   Holds L2 for 5' for warm-up   Modalities   Type of modalities Electrical stimulation   Electrical Stimulation   Joints Left;Hip   Position Right Sidelying   Time in minutes 10   Parameters TENS   Cold pack   Joints Left;Hip   Position Right Sidelying   Time in minutes 10   Parameters w/ TENS post Rx   Patient Education   What was taught? HEP: ice pack applciation, avoiding aggravating activities, squat and lift   Method Verbal;Demo;Practice   Patient comprehension Yes     Reola Mosher, PT, Lic # [id number]        Davanna He, [place], Lic # [id number]

## 2016-10-27 NOTE — Progress Notes (Signed)
Chief Complaint:  Tina [name] is a 61 year old female who presents for a physical exam.          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     Chronic left hip pain        Current Outpatient Prescriptions:  amLODIPine (NORVASC) 5 MG tablet TAKE 1 TABLET BY MOUTH EVERY DAY Disp: 30 tablet Rfl: 1   gabapentin (NEURONTIN) 300 MG capsule Take 1 capsule by mouth 3 (three) times daily Disp: 90 capsule Rfl: 2   cholecalciferol (VITAMIN D3) 1000 UNIT tablet Take 1 tablet by mouth daily Disp: 30 tablet Rfl: 11   FLUoxetine (PROZAC) 20 MG capsule Take 1 capsule by mouth daily Disp: 30 capsule Rfl: 11   OTHER MEDICATION Isoflavones (A [place] OTC preparation to treat hot flashes) Disp:  Rfl:      No current facility-administered medications for this visit.   Facility-Administered Medications Ordered in Other Visits:  sodium chloride 0.9 % flush 10-100 mL 10-100 mL Intra-articular Once in imaging [facility]       Allergies:  Review of Patient's Allergies indicates:   Pravachol               Nausea Only, Other (See Comments)    Comment:Malaise    Health Maintenance:  PHYSICAL EXAM (AGE 95+) due on 01/14/2015  FECAL OCCULT BLOOD AGE 95+ due on 03/29/2016  PAP SMEAR due on 09/27/2016  HPV SCREENING due on 09/27/2016  MAMMOGRAPHY due on 04/15/2017  AWQ Questionnaire due on 07/24/2017  HEALTH CARE PROXY due on 08/30/2020  LIPID SCREENING due on 07/24/2021  TDAP/TD VACCINE(2 - Td) due on 01/14/2024  HIV SCREENING Completed  HEP B HIGH RISK VACCINE EVAL (ONCE) Completed  HEP C SCREEN (DOB [birth year]) Completed  INFLUENZA VACCINE  Completed    Immunizations:  Immunization History   Administered Date(s) Administered   . Depo Provera 01/02/2005, 04/24/2008, 07/10/2008, 09/04/2008, 10/16/2008, 11/13/2008   . H1N1 0.36ml W/ Preserv 3 & > 07/10/2008   . HEP B ADULT 3 DOSE 20 and > 04/09/2015, 05/07/2015, 07/24/2016   . INFLUENZA VIRUS TRI W/PRESV VACCINE 18/> YRS IM (PRIVATE) 03/19/2009, 01/28/2010, 03/22/2011, 03/12/2012   . Influenza Virus Quad Presv Free Vacc 3/> Yrs IM 05/27/2014   . Influenza Virus Quadrivalent Vacc 3/> Yrs Im 03/13/2013, 07/24/2016   . Td 04/20/2003   . Tdap 01/13/2014       Histories:  Past Medical History:  No date: Arthritis  No date: H/o Condyloma      Comment: 2003 (vulvar)  No date: HTN (hypertension)  No date: Hypercholesteremia  No date: Hyperopia  No date: Presbyopia  06/17/2010: Right ear pain      Comment: Seen by ENT. 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 No signs of infection on the right.  Sent for hearing test.   No date: S/P appendectomy  No date: S/P laparoscopic procedure      Comment: Exploratory  No date: Wears eyeglasses  Past Surgical History:  2000: APPENDECTOMY      Comment: laparoscopy  9/04: ENDOMETRIAL BX W/WO ENDOCERVIX BX W/O DILAT SPX      Comment: endometrial bx = negative  No date: HYSTEROSCOPY BX ENDOMETRIUM&/POLYPC W/WO D&C      Comment: 3 times in [place]  No date: OB ANTEPARTUM CARE CESAREAN DLVR & POSTPARTUM      Comment: tubal ligation    Social History  Social History   Marital status: Married  Spouse name: [name]    Years of education: N/A  Number of children: 4     Occupational History  Babysitting SELF EMPLO Works daytime     Social History Main Topics   Smoking status: Former Smoker     Smokeless tobacco: Never Used    Alcohol use Yes    Comment: Occasionally    Drug use: No    Sexual activity: Yes    Partners: Male    Patent examiner protection: Surgical     Other Topics Concern   None on file     Social History Narrative    Immigrated from [place] in  2003       Family History    OTHER Mother     Comment: HTN, Heart problems    DES Exp Father     Comment: Died during heart catheterization at age 46    Glaucoma Father     Macular Degeneration Father     Cancer - Other Paternal Grandfather     Comment: prostate cancer     Cancer - Other Paternal Uncle     Comment: prostate cancer    Cancer - Breast Paternal Aunt        Review of Systems:                   Skin: negative  Eyes: negative  Ears/Nose/Throat: negative  Respiratory: negative  Cardiovascular: negative  Gastrointestinal: negative  Genitourinary: negative  [id number] having a lump of her vulva 1 month ago which expelled drainage and blood. States this vulva lesion has healed. Is not currently sexually active denies postmenopausal  bleeding, pelvic pain or discharge  no breast pain or new or enlarging lumps on self exam  Musculoskeletal: chronic left hip pain. Saw PT prior to this visit. Reports having left hip pain after doing PT. Denies increase in left hip pain  Neurologic: negative  Endocrine: negative  Psychiatric: negative  Hematologic/Lymphatic/Immunologic: negative    Physical:  BP 116/78  Pulse 90  Temp 97.8 F (36.6 C) (Oral)  Ht 5' 2.99" (1.6 m)  Wt 70.8 kg (156 lb)  LMP 06/08/2008  SpO2 98%  BMI 27.64 kg/m2  General appearance: healthy, alert, well developed, well nourished  Eyes: conjunctivae/corneas clear. PERRL, EOM's intact. Fundi benign  Skin: skin color, texture, turgor are normal  Head: Normocephalic. No masses, lesions, tenderness or abnormalities  Ears: External ears normal. Canals clear. TM's normal.  Nose/Sinuses: Nares normal. Septum midline. Mucosa normal. No drainage or sinus tenderness.  Oropharynx: Lips, mucosa, and tongue normal. Teeth and gums normal. Oropharynx moist and without lesion  Neck: Neck supple. No adenopathy. Thyroid symmetric, normal size, and without nodularity  Back: Back symmetric, no curvature. ROM normal. No CVA tenderness.  Lungs: Percussion normal. Good  diaphragmatic excursion. Lungs clear to auscultation bilaterally  Heart: PMI  normal. No lifts, heaves, or thrills. RRR. No murmurs, clicks, gallops or rubs  Breast: breasts symmetric, no dominant or suspicious mass, no skin or nipple changes, no axillary adenopathy and self exam in taught and encouraged  Abdomen: Abdomen soft, non-tender. BS normal. No masses, no organomegaly  Extremities: Extremities normal. No deformities, edema, or skin discoloration  Musculoskeletal: Spine ROM normal. Muscular strength intact.  Peripheral pulses: radial=4/4, femoral=4/4, popliteal=4/4, dorsalis pedis=4/4  Neuro: Gait normal. Reflexes normal and symmetric. Sensation grossly normal   Pelvic: External genitalia and vagina normal. Bimanual and rectovaginal exam normal  Rectal: negative    Health Counseling:  Smoking:  Reviewed and Discussed  Substance Use Issues:   Reviewed and Discussed   Seat Belts:  Reviewed and Discussed  Protective Sports Equipment:  Reviewed and Discussed  Smoke Dectectors:  Reviewed and Discussed  Diet, Exercise, Wt. Control:  Reviewed and Discussed  Dental Health:  Reviewed and Discussed  Vision Health:  Reviewed and Discussed  Mental Health:  Reviewed and Discussed  Domestic Violence:  Reviewed and Discussed  Sexual Health:  Reviewed and Discussed  BSE:  Reviewed and Discussed  Osteoperosis: Reviewed and Discussed  Health Care Proxy:  Reviewed and Discussed      ASSESSMENT/PLAN:  (Z00.00) Routine general medical examination at a health care facility  (primary encounter diagnosis)  Comment: Normal physical exam   Plan: no abnormal findings on pelvic exam, pt reassured     (E78.5) Hyperlipidemia LDL goal < 130  Comment: is not fasting today  Is due for repeat Lipid panel  Pt states she will come back to clinic for fasting lipids  Plan:     (Z12.4) Pap smear for cervical cancer screening  Comment:   Plan: OBTAINING SCREEN PAP SMEAR, HUMAN         PAPILLOMAVIRUS (HPV), CYTP CERV/VAG AUTO THIN         LAYER PREP  MNL SCREEN           (M25.552,  G89.29) Chronic left hip pain  Comment: stable  To continue to see PT and Orthopedics for management   Plan:     (Z12.31) Breast cancer screening  Comment:   Plan:  MAMMOGRAPHY SCREENING BILATERAL W CAD            (I10) Essential hypertension  Comment: BP stable  Plan: continue with current medications as directed

## 2016-10-30 ENCOUNTER — Ambulatory Visit (HOSPITAL_BASED_OUTPATIENT_CLINIC_OR_DEPARTMENT_OTHER): Payer: Medicaid Other | Admitting: Social Worker

## 2016-10-30 LAB — HUMAN PAPILLOMAVIRUS (HPV): HUMAN PAPILLOMAVIRUS: NEGATIVE

## 2016-10-31 ENCOUNTER — Other Ambulatory Visit (HOSPITAL_BASED_OUTPATIENT_CLINIC_OR_DEPARTMENT_OTHER): Payer: Self-pay | Admitting: Nurse Practitioner

## 2016-10-31 ENCOUNTER — Other Ambulatory Visit (HOSPITAL_BASED_OUTPATIENT_CLINIC_OR_DEPARTMENT_OTHER): Payer: Self-pay | Admitting: Orthopaedic Surgery

## 2016-10-31 DIAGNOSIS — I10 Essential (primary) hypertension: Secondary | ICD-10-CM

## 2016-10-31 DIAGNOSIS — M5432 Sciatica, left side: Secondary | ICD-10-CM

## 2016-10-31 DIAGNOSIS — G8929 Other chronic pain: Secondary | ICD-10-CM

## 2016-10-31 DIAGNOSIS — M25552 Pain in left hip: Secondary | ICD-10-CM

## 2016-10-31 DIAGNOSIS — M71452 Calcium deposit in bursa, left hip: Principal | ICD-10-CM

## 2016-10-31 MED FILL — VITAMIN D3 1000UNIT: 30 days supply | Qty: 30 | Fill #7 | Status: CP

## 2016-10-31 MED FILL — FLUOXETINE 20MG CAPS: 30 days supply | Qty: 30 | Fill #7 | Status: CP

## 2016-10-31 NOTE — Progress Notes (Signed)
PER Pharmacy, Tina [name] is a 61 year old female has requested a refill of GABAPENTIN 300MG .      Last ORTHOPEDICS Office Visit: 07/31/2016      Other Med Adult:  Most Recent BP Reading(s)  10/27/16 : 116/78          Cholesterol (mg/dL)   Date Value   07/24/2016 259 (*H)   ----------    LOW DENSITY LIPOPROTEIN DIRECT (mg/dL)   Date Value   07/24/2016 179   ----------    HIGH DENSITY LIPOPROTEIN (mg/dL)   Date Value   07/24/2016 55   ----------    TRIGLYCERIDES (mg/dL)   Date Value   07/24/2016 134   ----------        THYROID SCREEN TSH REFLEX FT4 (uIU/mL)   Date Value   03/16/2014 1.640   ----------        TSH (THYROID STIM HORMONE) (uIU/mL)   Date Value   09/23/2010 1.52   ----------    No results found for: HGBA1C    No results found for: POCA1C      No results found for: INR      SODIUM (mmol/L)   Date Value   03/19/2015 140   ----------      POTASSIUM (mmol/L)   Date Value   03/19/2015 4.1   ----------          CREATININE (mg/dL)   Date Value   03/19/2015 0.8   ----------    Documented patient preferred pharmacies:    [facility], [address]  Phone: [phone] Fax: [phone]

## 2016-10-31 NOTE — Progress Notes (Signed)
PER Pharmacy, Tina [name] is a 61 year old female has requested a refill of AMLODIPINE 5MG .      Last [facility] Office Visit: 10/27/2016  Last Physical Exam: 10/27/2016      HTN Med:    Most Recent BP Reading(s)  10/27/16 : 116/78  07/24/16 : 122/64  02/28/16 : 131/74      Documented patient preferred pharmacies:    [facility], [place]. [place]  Phone: [phone] Fax: [phone]

## 2016-11-01 MED FILL — *AMLODIPINE  5MG: 30 days supply | Qty: 30 | Fill #0 | Status: CP

## 2016-11-02 LAB — CYTOPATH, C/V, THIN LAYER

## 2016-11-03 ENCOUNTER — Encounter (HOSPITAL_BASED_OUTPATIENT_CLINIC_OR_DEPARTMENT_OTHER): Payer: Self-pay | Admitting: Internal Medicine

## 2016-11-06 ENCOUNTER — Ambulatory Visit (HOSPITAL_BASED_OUTPATIENT_CLINIC_OR_DEPARTMENT_OTHER): Payer: Medicaid Other | Admitting: Social Worker

## 2016-11-10 ENCOUNTER — Ambulatory Visit (HOSPITAL_BASED_OUTPATIENT_CLINIC_OR_DEPARTMENT_OTHER): Payer: Medicaid Other | Admitting: Physical Therapy

## 2016-11-10 DIAGNOSIS — M25552 Pain in left hip: Principal | ICD-10-CM

## 2016-11-10 DIAGNOSIS — G8929 Other chronic pain: Principal | ICD-10-CM

## 2016-11-10 NOTE — Progress Notes (Signed)
S: Patient reports 1/01 pain in her left butt cheek.  O: see flowsheet below  A: Patient tolerated the treatment well and reports with manual lumbar traction. She did not tolerate side-lying clamshells (with or without resistance). Estim applied to affected area with cold pack as patient reported significant relief following TENS at the last visit.  P: Cont POC and progress as tolerated.      11/10/16 1200   Language Information   Language of Care English   Interpreter No   Precautions   Precautions No   Weight Bearing Status   RLE FWB   LLE FWB   Rehab Discipline   Rehab Discipline PT   Visit   Visit number 3   POC Due date 4 weeks from 5/22   Time Calculation   Start Time 1100   Stop Time 1140   Time Calculation (min) 40 min   Pain   Pain Assessment (Thermometer Scale) Milds Pain (1-2)   Manual Therapy   Manual Therapy Yes   Technique manual lumbar traction   Manual Therapy 2   Technique IASTM: left buttocks and postero-lateral thigh   Ther Exercise   Therapeutic Exercise? Yes   Exercise Stretching: internal and external rotators   Holds 2x30"   Ther Exercise 2   Exercise supine CS w/ RTB   Reps 2 10   Sets 2 2   Holds 2 bracing   Modalities   Type of modalities Electrical stimulation   Electrical Stimulation   Joints Left;Hip   Position Right Sidelying   Time in minutes 10   Parameters TENS   Cold pack   Joints Left;Hip   Position Right Sidelying   Time in minutes 10   Parameters w/ TENS post Rx   Patient Education   What was taught? HEP: supine CS   Method Verbal;Demo;Practice   Patient comprehension Yes     Reola Mosher, [place], Lic # [id number]

## 2016-11-17 ENCOUNTER — Ambulatory Visit (HOSPITAL_BASED_OUTPATIENT_CLINIC_OR_DEPARTMENT_OTHER): Payer: Self-pay | Admitting: Internal Medicine

## 2016-11-24 ENCOUNTER — Encounter (HOSPITAL_BASED_OUTPATIENT_CLINIC_OR_DEPARTMENT_OTHER): Payer: Self-pay | Admitting: Physical Therapy

## 2016-11-24 ENCOUNTER — Ambulatory Visit (HOSPITAL_BASED_OUTPATIENT_CLINIC_OR_DEPARTMENT_OTHER): Payer: Self-pay | Admitting: Physical Therapy

## 2016-11-24 NOTE — Progress Notes (Signed)
Patient came to her scheduled appointment, but unable to stay for treatment due to insurance issues. She recently purchased a TENS unit and wants to learn how to use it. Instructed patient on adjusting frequency and intensity and electrode placement. Patient shows good understanding. A follow-up appointment scheduled on 12/08/2016 at 25 am.  Reola Mosher, PT, Lic # [id number]

## 2016-11-28 ENCOUNTER — Ambulatory Visit (HOSPITAL_BASED_OUTPATIENT_CLINIC_OR_DEPARTMENT_OTHER): Payer: Medicaid Other | Admitting: Orthopaedic Surgery

## 2016-11-28 ENCOUNTER — Other Ambulatory Visit (HOSPITAL_BASED_OUTPATIENT_CLINIC_OR_DEPARTMENT_OTHER): Payer: Self-pay | Admitting: Internal Medicine

## 2016-11-28 DIAGNOSIS — R232 Flushing: Secondary | ICD-10-CM

## 2016-11-28 MED FILL — FLUOXETINE 20MG CAPS: 30 days supply | Qty: 30 | Fill #0 | Status: CP

## 2016-11-28 MED FILL — VITAMIN D3 1000UNIT: 30 days supply | Qty: 30 | Fill #8 | Status: CP

## 2016-11-28 MED FILL — *AMLODIPINE  5MG: 30 days supply | Qty: 30 | Fill #1 | Status: CP

## 2016-11-28 NOTE — Progress Notes (Signed)
PER Pharmacy,Tina [name] is a 61 year old female has requested a refill offluoxetine capsule.  ?  Last Office Visit: 10/27/2016 with [name], Carolyn   Last Physical Exam: 10/27/2016  ?  Other Med Adult:  Most Recent BP Reading(s)  10/27/16 : 116/78          Cholesterol (mg/dL)   Date Value   07/24/2016 259 (*H)   ----------    LOW DENSITY LIPOPROTEIN DIRECT (mg/dL)   Date Value   07/24/2016 179   ----------    HIGH DENSITY LIPOPROTEIN (mg/dL)   Date Value   07/24/2016 55   ----------    TRIGLYCERIDES (mg/dL)   Date Value   07/24/2016 134   ----------        THYROID SCREEN TSH REFLEX FT4 (uIU/mL)   Date Value   03/16/2014 1.640   ----------        TSH (THYROID STIM HORMONE) (uIU/mL)   Date Value   09/23/2010 1.52   ----------    No results found for: HGBA1C    No results found for: POCA1C      No results found for: INR      SODIUM (mmol/L)   Date Value   03/19/2015 140   ----------      POTASSIUM (mmol/L)   Date Value   03/19/2015 4.1   ----------          CREATININE (mg/dL)   Date Value   03/19/2015 0.8   ----------  Documented patient preferred pharmacies:    [facility], [address]  Phone: [phone] Fax: [phone]

## 2016-12-04 ENCOUNTER — Ambulatory Visit (HOSPITAL_BASED_OUTPATIENT_CLINIC_OR_DEPARTMENT_OTHER): Payer: Self-pay | Admitting: Obstetrics & Gynecology

## 2016-12-04 NOTE — Telephone Encounter (Signed)
Regarding: headaches, feeling off  ----- Message from [name] sent at 12/04/2016 11:24 AM EDT -----  Tina [name] [id number], 61 year old, female    Calls today:    What are the symptoms: patient is calling, started on Friday felt very off like she was going to faint but didn't loose consciousness, and started to sweat. Woke up on Saturday very sore with a temp of 100. Patient still feels off, very tired and is having very bad headaches.   How long has patient been sick? Started friday  Person calling on behalf of patient: Patient (self)    CALL BACK NUMBER: [phone]    Patient's language of care: [name]  Patient needs a [name] interpreter.  Patient's PCP: [name], APRN

## 2016-12-04 NOTE — Telephone Encounter (Signed)
Tc with [name] interpreter .   Lm to call nurses at [phone] .

## 2016-12-04 NOTE — Telephone Encounter (Signed)
[place] assisting with this call.      Patient states she is feeling a little better today but on Friday was feeling bad.    On Friday while helping her husband move something feel really dizzy and thought she would faint.  She had just taken her BP medication. These symptoms resolved after 15 minutes.  Did not take her BP.  She also had a headache, ibuprofen helped ( 800 mg q 8 hours )    On Saturday she had a very bad headache and felt a lot of body aches.    Drinking plenty of fluids  Headache  10/10 when severe and now 4-5/10   Last took medication yesterday.  Taking her BP medications daily  No visual changes  Runny nose yesterday  No sore throat, or cough  No chest pain, no breathing difficulty.  No fever or chills.  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    Advised to present to ED for headache that does not go away with Ibuprofen.    Appointment with Dr Westley Hummer for 12 noon tomorrow.

## 2016-12-05 ENCOUNTER — Ambulatory Visit (HOSPITAL_BASED_OUTPATIENT_CLINIC_OR_DEPARTMENT_OTHER): Payer: Medicaid Other | Admitting: Internal Medicine

## 2016-12-08 ENCOUNTER — Ambulatory Visit (HOSPITAL_BASED_OUTPATIENT_CLINIC_OR_DEPARTMENT_OTHER): Payer: Medicaid Other | Admitting: Internal Medicine

## 2016-12-08 ENCOUNTER — Ambulatory Visit (HOSPITAL_BASED_OUTPATIENT_CLINIC_OR_DEPARTMENT_OTHER): Payer: Medicaid Other | Admitting: Physical Therapy

## 2016-12-08 DIAGNOSIS — M25552 Pain in left hip: Principal | ICD-10-CM

## 2016-12-08 DIAGNOSIS — G8929 Other chronic pain: Secondary | ICD-10-CM

## 2016-12-08 NOTE — Progress Notes (Signed)
PHYSICAL THERAPY DISCHARGE REPORT      DIAGNOSIS: Chronic left hip pain [M25.552, G89.29]      MD: [name], APRN      REASONS FOR DISCHARGE: Tina [name],  has been discharged from Physical Therapy after a total of 4 visits. The patient has made excellent progress with physical therapy, as evident by 0/10 pain reported in the past 2+ weeks. She is happy with her progress and would like to get discharged. On examiantion, she exhibits excellent lumbar and hip mobility. She does report left buttock pain with slump testing. But reports ability to perform all ADLs with no symptoms. At this time, the patient has achieved the functional goals and therefore is being discharged.      VISIT SUMMARY:  Therapeutic exercises were reviewed. An exercise handout was provided.  The patient was instructed to continue with the HEP, in order to maintain the benefits made with PT. The patient shows good understanding, confidence and independence of their home exercises. Patient's concerns were addressed in depth.    MISTINA COATNEY is in agreement with the POC.     RECOMMENDATIONS: It is recommended that the patient follows up with their referring MD if symptoms increase or additional problems arise.     If you have any questions please feel free to contact me by email or phone.      Thank you,    Reola Mosher, PT  Staff Physical Therapist  [place]: [place]  [email or web address]  Phone: [phone]  [phone]         12/08/16 1100   Language Information   Language of Care English   Interpreter No   Precautions   Precautions No   Rehab Discipline   Rehab Discipline PT   Visit   Visit number 4   Time Calculation   Start Time 1100   Stop Time 1115   Time Calculation (min) 15 min   Pain   Pain Assessment (Thermometer Scale) No Pain (0)   Ther Exercise   Therapeutic Exercise? Yes   Exercise DKTC   Holds 2x30"   Ther Exercise 2   Exercise L-spine and hip testing: ROM, MMT, special testing   Ther Exercise  3   Exercise 3 reviewed home exercises   Patient Education   What was taught? cont HEP   Method Verbal   Patient comprehension Yes     Reola Mosher, PT, Lic # [id number]        Merrit Friesen, [place], Lic # [id number]

## 2016-12-26 ENCOUNTER — Encounter (HOSPITAL_BASED_OUTPATIENT_CLINIC_OR_DEPARTMENT_OTHER): Payer: Self-pay | Admitting: Internal Medicine

## 2016-12-26 ENCOUNTER — Ambulatory Visit (HOSPITAL_BASED_OUTPATIENT_CLINIC_OR_DEPARTMENT_OTHER): Payer: Medicaid Other | Admitting: Lab

## 2016-12-26 DIAGNOSIS — Z1211 Encounter for screening for malignant neoplasm of colon: Secondary | ICD-10-CM

## 2016-12-26 DIAGNOSIS — Z1212 Encounter for screening for malignant neoplasm of rectum: Principal | ICD-10-CM

## 2016-12-26 LAB — POC IMMUNOASSAY FECAL OCCULT BLOOD TEST: POC FECAL OCCULT BLOOD TEST (IMMUNOASSAY): NEGATIVE

## 2016-12-26 NOTE — Progress Notes (Signed)
iFOB was dropped at LAB by pt and resulted negative.

## 2016-12-28 ENCOUNTER — Other Ambulatory Visit (HOSPITAL_BASED_OUTPATIENT_CLINIC_OR_DEPARTMENT_OTHER): Payer: Self-pay | Admitting: Nurse Practitioner

## 2016-12-28 DIAGNOSIS — I10 Essential (primary) hypertension: Principal | ICD-10-CM

## 2016-12-28 NOTE — Progress Notes (Signed)
PER Pharmacy, Tina [name] is a 61 year old female has requested a refill of Amlodipine 5mg .      Last Office Visit: 10/27/16  Last Physical Exam: 10/27/16      HTN Med:    Most Recent BP Reading(s)  10/27/16 : 116/78  07/24/16 : 122/64  02/28/16 : 131/74      Documented patient preferred pharmacies:    [facility], [place]. [place]  Phone: [phone] Fax: [phone]

## 2016-12-29 ENCOUNTER — Ambulatory Visit (HOSPITAL_BASED_OUTPATIENT_CLINIC_OR_DEPARTMENT_OTHER): Payer: Medicaid Other | Admitting: Internal Medicine

## 2016-12-29 VITALS — BP 122/76 | HR 68 | Temp 97.5°F | Wt 158.0 lb

## 2016-12-29 DIAGNOSIS — R42 Dizziness and giddiness: Secondary | ICD-10-CM

## 2016-12-29 DIAGNOSIS — G44229 Chronic tension-type headache, not intractable: Secondary | ICD-10-CM

## 2016-12-29 DIAGNOSIS — N644 Mastodynia: Secondary | ICD-10-CM

## 2016-12-29 DIAGNOSIS — E785 Hyperlipidemia, unspecified: Secondary | ICD-10-CM

## 2016-12-29 DIAGNOSIS — N950 Postmenopausal bleeding: Secondary | ICD-10-CM

## 2016-12-29 LAB — CBC, PLATELET & DIFFERENTIAL
ABSOLUTE BASO COUNT: 0 10*3/uL (ref 0.0–0.1)
ABSOLUTE EOSINOPHIL COUNT: 0.1 10*3/uL (ref 0.0–0.8)
ABSOLUTE IMM GRAN COUNT: 0.01 10*3/uL (ref 0.00–0.03)
ABSOLUTE LYMPH COUNT: 2.4 10*3/uL (ref 0.6–5.9)
ABSOLUTE MONO COUNT: 0.5 10*3/uL (ref 0.2–1.4)
ABSOLUTE NEUTROPHIL COUNT: 2.7 10*3/uL (ref 1.6–8.3)
BASOPHIL %: 0.2 % (ref 0.0–1.2)
EOSINOPHIL %: 2.2 % (ref 0.0–7.0)
HEMATOCRIT: 41.6 % (ref 34.1–44.9)
HEMOGLOBIN: 13.9 g/dL (ref 11.2–15.7)
IMMATURE GRANULOCYTE %: 0.2 % (ref 0.0–0.4)
LYMPHOCYTE %: 41.9 % (ref 15.0–54.0)
MEAN CORP HGB CONC: 33.4 g/dL (ref 31.0–37.0)
MEAN CORPUSCULAR HGB: 31.6 pg (ref 26.0–34.0)
MEAN CORPUSCULAR VOL: 94.5 fL (ref 80.0–100.0)
MEAN PLATELET VOLUME: 13.4 fL — ABNORMAL HIGH (ref 8.7–12.5)
MONOCYTE %: 8.8 % (ref 4.0–13.0)
NEUTROPHIL %: 46.7 % (ref 40.0–75.0)
PLATELET COUNT: 211 10*3/uL (ref 150–400)
RBC DISTRIBUTION WIDTH STD DEV: 43.1 fL (ref 35.1–46.3)
RBC DISTRIBUTION WIDTH: 12.8 % (ref 11.5–14.3)
RED BLOOD CELL COUNT: 4.4 M/uL (ref 3.90–5.20)
WHITE BLOOD CELL COUNT: 5.8 10*3/uL (ref 4.0–11.0)

## 2016-12-29 LAB — COMPREHENSIVE METABOLIC PANEL
ALANINE AMINOTRANSFERASE: 31 U/L (ref 12–45)
ALBUMIN: 3.6 g/dL (ref 3.4–5.0)
ALKALINE PHOSPHATASE: 98 U/L (ref 45–117)
ANION GAP: 9 mmol/L (ref 5–15)
ASPARTATE AMINOTRANSFERASE: 20 U/L (ref 8–34)
BILIRUBIN TOTAL: 0.3 mg/dL (ref 0.2–1.0)
BUN (UREA NITROGEN): 14 mg/dL (ref 7–18)
CALCIUM: 9.3 mg/dL (ref 8.5–10.1)
CARBON DIOXIDE: 29 mmol/L (ref 21–32)
CHLORIDE: 104 mmol/L (ref 98–107)
CREATININE: 0.7 mg/dL (ref 0.4–1.2)
ESTIMATED GLOMERULAR FILT RATE: >60 mL/min (ref >60)
Glucose Random: 93 mg/dL (ref 74–160)
POTASSIUM: 5.2 mmol/L — ABNORMAL HIGH (ref 3.5–5.1)
SODIUM: 142 mmol/L (ref 136–145)
TOTAL PROTEIN: 7.1 g/dL (ref 6.4–8.2)

## 2016-12-29 LAB — PLATELET SCAN: PLATELET ESTIMATE: NORMAL

## 2016-12-29 LAB — LIPID PANEL
Cholesterol: 291 mg/dL (ref 0–239)
HIGH DENSITY LIPOPROTEIN: 53 mg/dL (ref 40–?)
LOW DENSITY LIPOPROTEIN DIRECT: 211 mg/dL (ref 0–189)
TRIGLYCERIDES: 136 mg/dL (ref 0–150)

## 2016-12-29 LAB — RBCMORPH

## 2016-12-29 LAB — THYROID SCREEN TSH REFLEX FT4: THYROID SCREEN TSH REFLEX FT4: 1.19 u[IU]/mL (ref 0.358–3.740)

## 2016-12-29 NOTE — Progress Notes (Signed)
[facility]    CC: Post menopausal bleeding PV    HPI: Tina [name] is a 61 year old year old female who presents to clinic with the following concerns:   Patient is postmenopausal since many years. Was scheduled to have mammogram 3 weeks back but was not able to complete it because of breast fullness and tenderness bilaterally   Reports onset of light vaginal bleeding, bright red blood x 3 days   Associated with pelvic pain. Is not sexually active presently. Patient is particularly concerned about breast tenderness, says it was so painful that she could not even wear her bra. Also associated with increased temperature of her breasts.    Reports having onset of dizziness and pre syncope  2 weeks ago.reports feeling  very dizzy. Associated with increased sweating during the episode and headache. She was carrying some heavy stuff on that day and she exerted more than usual. The weather was very hot that day.denies chest pain, shortness of breath with the dizziness but associated palpitations. No similar episodes since this episode. Patient was nauseous during this episode but no vomiting, light sensitivity, seizures, fainting, loss of consciousness.    Past h/o headaches as part of premenstrual syndrome.  Denies increase or change in nature of her headaches. Denies head trauma, falls, thunderclap headaches         ROS: no chest pain/shortness of breath/edema; no dyspnea/cough/wheezing; no fevers/chills/night sweats; no rash; no jaundice; no abd pain; no hemoptysis; no hematemesis; no melena or blood in stool; no joint swelling; no seizures; no vision or hearing loss; no lumps/bumps/swollen glands; no excessive thirst or urination; no temp intolerance; no change in hair/hair texture/voice.       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 No signs of infection on the right.                Sent for hearing test.   No date: S/P appendectomy  No date: S/P laparoscopic procedure      Comment: Exploratory  No date: Wears eyeglasses     Review of Patient's Allergies indicates:   Pravachol               Nausea Only, Other (See Comments)    Comment:Malaise     @[facility] @     .Social History Narrative    Immigrated from [place] in 2003      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     Chronic left hip pain    Medications:  reviewed with patient    Current Outpatient Prescriptions on File Prior to Visit:  amLODIPine (NORVASC) 5 MG tablet TAKE 1 TABLET BY MOUTH EVERY DAY Disp: 30 tablet Rfl: 1   FLUoxetine (PROZAC) 20 MG capsule Take 1 capsule by mouth daily Disp: 30 capsule Rfl: 11   gabapentin (NEURONTIN) 300 MG capsule Take 1 capsule by mouth 3 (three) times daily Disp: 90 capsule Rfl: 2   cholecalciferol (VITAMIN D3) 1000 UNIT tablet Take 1 tablet by mouth daily Disp: 30 tablet Rfl: 11   OTHER MEDICATION Isoflavones (A [place] OTC preparation to treat hot flashes) Disp:  Rfl:      Current Facility-Administered Medications on File Prior to Visit:  sodium chloride 0.9 % flush 10-100 mL 10-100 mL Intra-articular Once in imaging [place] Narrative    Immigrated from [place] in 2003      OBJECTIVE:   Vital Signs: BP 122/76  Pulse 68  Temp 97.5 F (36.4 C) (Oral)  Wt 71.7 kg (158 lb)  LMP 06/08/2008  SpO2 98%  BMI 28 kg/m2  General:   Patient is well developed, well nourished and in no apparent distress.   HEENT:  Pupils equal round and reactive to light.  Extraocular muscle range of motion full and without nystagmus.  No scleral icterus. Bilateral tympanic membranes clear.  Oral mucous membranes moist.   Neck:  No cervical, supraclavicular or infraclavicular lymphadenopathy.  No thyromegaly.   CV:  Auscultation reveals a regular rate and rhythm without murmurs, rubs or gallops.   Resp:  Lungs clear to auscultation without wheezes, rales or rhonchi.   Breasts: normal without suspicious masses, skin or nipple changes or axillary nodes, self-exam is taught and encouraged.  Abdomen:  Bowel sounds present.  Abdomen soft, non-tender, nondistended.  No hepatosplenomegaly.   Pelvic exam: EGBUS within normal limits, normal cervix without lesions, polyps or tenderness, uterus normal size, shape, consistency, no mass or tenderness, adnexa normal in size without mass or tenderness.  Skin:  No lesions.   Psychiatric:  Alert & oriented with normal affect and insight.  Patient does not appear depressed or anxious.           ASSESMENT/PLAN:  (N95.0) Post-menopausal bleeding  (primary encounter diagnosis)  Comment:   Menopause about 8 years back   Single episode of light bleeding associated with lower abdominal and pelvic cramps   Will refer to OB/GYN for endometrial biospy   Pelvic u/s- r/o ovarian and uterine pathology   Strongly advised pt to report to ER with pelvic pain and vaginal bleeding requiring changing more than one pad/tampon a hour  Plan:    REFERRAL TO GYNECOLOGY (INT),    US PELVIC NONOBSTETRIC REAL-TIME IMAGE COMPLETE,    THYROID SCREEN TSH REFLEX T4    (N64.4) Breast pain  Comment:    Fullness and tenderness associated with  Post menopausal bleed   Breast exam normal, no concerns of infection, no masses noted.    Will reschedule mammogram   Plan:     (R42) Dizziness and giddiness  Comment: ddx include BBPV, labyrinthitis   Single  episode following physical exertion in hot weather   Associated with palpitations but no chest pain, shortness of breath   ? Dehydration at time of pt dizziness   Plan:    CBC, PLATELET & DIFFERENTIAL,    COMPREHENSIVE METABOLIC PANEL      (N62.952) Chronic tension-type headache, not intractable  Comment:   Usually used to be  associated with PMS, post menopausal since 2010.   Now has been present almost everyday since episode of post menopausal bleeding 3 weeks back.   Today, pain is 2/10  Plan:    Tylenol as needed for the pain    (E78.5) Hyperlipidemia LDL goal < 130  Comment:      Ref. Range 07/24/2016 16:51   Cholesterol Latest Ref Range: 0 - 239 mg/dL 259 (*H)   TRIGLYCERIDES Latest Ref Range: 0 - 150 mg/dL 134   HIGH DENSITY LIPOPROTEIN Latest Ref Range: 40- mg/dL 55   LOW DENSITY LIPOPROTEIN DIRECT Latest Ref Range: 0 - 189 mg/dL 179     Plan:    Fasting lipid panel today    We discussed  the importance of medication compliance. The patient was ready to learn and no apparent learning barriers were identified. I explained the diagnosis and treatment plan, and the patient expressed understanding of the content. Possible side effects of the prescribed medication(s) were explained.I attempted to answer any questions regarding the diagnosis and the proposed treatment.    Discussed with the patient and all questions fully answered. She will call me if any problems arise.  I have reviewed the past medical, surgical, social and family history and updated these sections of EpicCare as relevant. All interim labs, test results, and consult notes were reviewed and discussed with the patient. Medications were reconciled during this visit and a current medication list was given to the patient at the end of the visit.    This documentation serves as a record of the services and decisions personally performed by Treyton Slimp [name] APRN. It was created by Fransico Meadow, medical scribe, and was based on the provider's  statement to [facility].    Tina Inabinet [name], APRN       I have spent 25 minutes in face to face time with this patient/patient proxy of which > 50% was in counseling or coordination of care regarding above issues/Dx.

## 2017-01-01 ENCOUNTER — Encounter (HOSPITAL_BASED_OUTPATIENT_CLINIC_OR_DEPARTMENT_OTHER): Payer: Self-pay | Admitting: Internal Medicine

## 2017-01-05 ENCOUNTER — Ambulatory Visit (HOSPITAL_BASED_OUTPATIENT_CLINIC_OR_DEPARTMENT_OTHER): Payer: Self-pay | Admitting: Internal Medicine

## 2017-01-05 DIAGNOSIS — N950 Postmenopausal bleeding: Principal | ICD-10-CM

## 2017-01-05 LAB — US PELVIC NON-OB W TRANSVAG, 3D

## 2017-01-05 NOTE — Addendum Note (Signed)
Addended by: Remi Haggard on: 01/05/2017 03:09 PM     Modules accepted: Orders

## 2017-01-09 ENCOUNTER — Telehealth (HOSPITAL_BASED_OUTPATIENT_CLINIC_OR_DEPARTMENT_OTHER): Payer: Self-pay | Admitting: Internal Medicine

## 2017-01-09 DIAGNOSIS — E875 Hyperkalemia: Principal | ICD-10-CM

## 2017-01-09 DIAGNOSIS — E785 Hyperlipidemia, unspecified: Secondary | ICD-10-CM

## 2017-01-09 NOTE — Progress Notes (Signed)
Call made to pt to discuss lab results  And pelvic u/s results    Hx of elevated lipids, ASCVD risk 5.1%    slightly elevated K+  Normal TSH  CBC stable    Seen in clinic with onset of postmenopausal bleeding  Is scheduled to see OB/GYN    Pelvic u/s with impression of   The endometrium is thin and uniform. Neither ovary could be    visualized.      Left message on pt voicemail. Awaiting call back

## 2017-01-10 NOTE — Progress Notes (Signed)
Patient calling back .     Discussed results .     She will try to get a sooner appointment with GYN .   She is concerned , Questions why they do not see her ovaries . Still has some pain in her lower abdomen at times still . A minute amount of vaginal bleeding .   She feels this is not normal so wants to be seen by GYN sooner .       Does have some abnormal labs .  To provider to review      To provider to contact patient she still has questions

## 2017-01-11 MED FILL — *AMLODIPINE  5MG: 30 days supply | Qty: 30 | Fill #0 | Status: CP

## 2017-01-11 NOTE — Progress Notes (Signed)
Please advise pt her lipid were elevated, I can refer her to see Nutrition and repeat her lipids in 3 months, if they are still elevated, I will need to start pt on a statin    Noted borderline K+ 5.2,  We can repeat this   Future lab order placed to repeat [place], can you please call OB/GYN to see if there is a sooner appt for pt to be seen

## 2017-01-11 NOTE — Progress Notes (Signed)
Nothing sooner with GYN at the moment, provided phone # to gyn to call for cancellations during the week.

## 2017-01-11 NOTE — Progress Notes (Signed)
Spoke with Tina [name] .     She is willing to Start Statin . Feels she does not need nutritionist . Knows what to avoid to lower Cholesterol .   Has researched it .     Please order Statin .     She will come in tomorrow for repeat K .   Has been avoiding foods high in Potassium     Appreciates [place] helping her get a sooner appointment to GYN .     Will await Wilma's call .

## 2017-01-12 ENCOUNTER — Ambulatory Visit (HOSPITAL_BASED_OUTPATIENT_CLINIC_OR_DEPARTMENT_OTHER): Payer: Medicaid Other

## 2017-01-12 DIAGNOSIS — E785 Hyperlipidemia, unspecified: Principal | ICD-10-CM

## 2017-01-12 DIAGNOSIS — E875 Hyperkalemia: Secondary | ICD-10-CM

## 2017-01-12 LAB — LIPID PANEL
Cholesterol: 279 mg/dL (ref 0–239)
HIGH DENSITY LIPOPROTEIN: 58 mg/dL (ref 40–?)
LOW DENSITY LIPOPROTEIN DIRECT: 191 mg/dL (ref 0–189)
TRIGLYCERIDES: 108 mg/dL (ref 0–150)

## 2017-01-12 LAB — POTASSIUM: POTASSIUM: 4 mmol/L (ref 3.5–5.1)

## 2017-01-12 NOTE — Progress Notes (Signed)
Labs drawn

## 2017-01-19 ENCOUNTER — Telehealth (HOSPITAL_BASED_OUTPATIENT_CLINIC_OR_DEPARTMENT_OTHER): Payer: Self-pay | Admitting: Ambulatory Care

## 2017-01-19 NOTE — Progress Notes (Signed)
To provider to order Statin if appropriate .

## 2017-01-19 NOTE — Telephone Encounter (Signed)
-----   Message from Ardyth Man sent at 01/19/2017  1:07 PM EDT -----  Regarding: test results   Contact: [phone]  Tina [name] [id number], 62 year old, female    Calls today:  Test Results  Test Results Request from Patient    What test result(s) is the patient requesting? blood  Date testing was done 8/31  Who ordered the test(s)[name]  Message routed to nurses  Mychart status reviewed No    Person calling on behalf of patient: Patient (self)    [place] NUMBER: [phone]  Best time to call back:   Cell phone:   Other phone:    Patient's language of care: [name] ([place]    Patient needs a [name] interpreter.    Patient's PCP: [name], APRN

## 2017-01-19 NOTE — Progress Notes (Signed)
Discussed results .     She wants to start the Stain .   Her diet is already low fat low carb . "I know what to eat and what to avoid "   Potassium is normal 4.0  To provider to review .

## 2017-01-23 NOTE — Progress Notes (Signed)
Call made to pt to discuss lipids, statin therapy    Left message on pt voicemail, awaiting call back

## 2017-01-26 MED ORDER — SIMVASTATIN 10 MG PO TABS: 10 mg | tablet | Freq: Every evening | ORAL | 11 refills | 0 days | Status: DC

## 2017-01-26 MED ORDER — SIMVASTATIN 10 MG PO TABS
10.0000 mg | ORAL_TABLET | Freq: Every evening | ORAL | 11 refills | Status: DC
Start: 2017-01-26 — End: 2017-02-06

## 2017-01-26 MED FILL — SIMVASTATIN 10MG: 30 days supply | Qty: 30 | Fill #0 | Status: CP

## 2017-01-26 NOTE — Progress Notes (Signed)
Patient calling you back 

## 2017-01-26 NOTE — Progress Notes (Signed)
Call made to pt  Discussed results  Elevated lipids, normal K  Pt desires to restart statin  + family hx of hyperlipidemia   Will Rx Zocor 10 mg daily, recheck labs in 6-8 weeks  Pt agreed to plan

## 2017-01-30 ENCOUNTER — Ambulatory Visit (HOSPITAL_BASED_OUTPATIENT_CLINIC_OR_DEPARTMENT_OTHER): Payer: Medicaid Other | Admitting: Registered"

## 2017-02-02 ENCOUNTER — Ambulatory Visit (HOSPITAL_BASED_OUTPATIENT_CLINIC_OR_DEPARTMENT_OTHER): Payer: Self-pay | Admitting: Internal Medicine

## 2017-02-02 ENCOUNTER — Ambulatory Visit (HOSPITAL_BASED_OUTPATIENT_CLINIC_OR_DEPARTMENT_OTHER): Payer: Medicaid Other | Admitting: Family Medicine

## 2017-02-02 DIAGNOSIS — Z1239 Encounter for other screening for malignant neoplasm of breast: Secondary | ICD-10-CM

## 2017-02-02 LAB — MA SCREENING MAMMO BILATERAL DIGITAL WITH DBT & CAD

## 2017-02-02 NOTE — Addendum Note (Signed)
Addended by: Rowan Blase on: 02/02/2017 02:15 PM     Modules accepted: Orders

## 2017-02-05 ENCOUNTER — Ambulatory Visit (HOSPITAL_BASED_OUTPATIENT_CLINIC_OR_DEPARTMENT_OTHER): Payer: Self-pay

## 2017-02-05 MED ORDER — EPINEPHRINE 0.3 MG/0.3ML AUTO-INJECTOR: 0 mg | each | Freq: Once | INTRAMUSCULAR | 1 refills | 0 days | Status: AC | PRN

## 2017-02-05 MED ORDER — EPINEPHRINE 0.3 MG/0.3ML AUTO-INJECTOR
0.3000 mg | Freq: Once | INTRAMUSCULAR | 1 refills | Status: AC | PRN
Start: 2017-02-05 — End: 2018-04-26

## 2017-02-05 MED FILL — EPINEPHRINE INJ 0.3MG: 2 days supply | Qty: 2 | Fill #0 | Status: CP

## 2017-02-05 NOTE — Telephone Encounter (Signed)
Spoke with [name], will send epi pen script to pharmacy, patient can f/u in next two days

## 2017-02-05 NOTE — Telephone Encounter (Signed)
Regarding: Allergies   ----- Message from Dorris Singh sent at 02/05/2017  3:48 PM EDT -----  Tina [name] [id number], 61 year old, female    Calls today:  Sick    What are the symptoms: ???allergies, throat was closing on Sunday morning, took benadryl twice, started feeling better.    How long has patient been sick?   What has pt. tried at home took benadryl twice Sunday morning.   Person calling on behalf of patient: Patient (self)    CALL BACK NUMBER: [phone]  Best time to call back:   Cell phone:   Other phone:    Patient's language of care: [name] ([place]    Patient needs [name] interpreter.    Patient's PCP: [name], APRN

## 2017-02-05 NOTE — Telephone Encounter (Signed)
Epi pen Rx sent to pt pharmacy

## 2017-02-05 NOTE — Telephone Encounter (Signed)
Call to patient, advised as below  Scheduled for tomorrow with dr Jill Alexanders per patient request  Teaching done on use of epipen  Patient states understanding-will review with pharmacy when she picks it up tonight  Advised to ER if any increased/changed/new symptoms-she agrees

## 2017-02-05 NOTE — Telephone Encounter (Addendum)
Call to patient, reports that feeling ok now  No complaints at present  This past Sunday felt like she was having shortness of breath, woke up like this out of a sleep, felt like throat swollen, this is third time it has happened-took 2 benedryl and the swelling was relieved.  Patient is very concerned  Denies use of any new medications,foods or products  Woke up middle of night having some sob, no difficulty swallowing  No other symptoms at times of SOB except   Throat was sore  Relieved by benedryl  States throat was swollen, although denied difficulty swallowing  Works with a doctor-? Allergy to amlodipine-but has taken this for long time  First episode was 4 months ago, second time 2 months ago, then this past Sunday  Did not eat out prior to these episodes or do anything different than normal  Paged [name] to advise

## 2017-02-06 ENCOUNTER — Encounter (HOSPITAL_BASED_OUTPATIENT_CLINIC_OR_DEPARTMENT_OTHER): Payer: Self-pay | Admitting: Internal Medicine

## 2017-02-06 ENCOUNTER — Ambulatory Visit (HOSPITAL_BASED_OUTPATIENT_CLINIC_OR_DEPARTMENT_OTHER): Payer: Medicaid Other | Admitting: Internal Medicine

## 2017-02-06 VITALS — BP 120/70 | HR 81 | Temp 98.6°F | Wt 158.0 lb

## 2017-02-06 DIAGNOSIS — T7840XA Allergy, unspecified, initial encounter: Principal | ICD-10-CM

## 2017-02-06 DIAGNOSIS — Z23 Encounter for immunization: Secondary | ICD-10-CM

## 2017-02-06 MED ORDER — SODIUM CHLORIDE 0.9 % IJ SOLN
2.00 mL | INTRAMUSCULAR | Status: DC
Start: ? — End: 2017-02-06

## 2017-02-06 NOTE — Progress Notes (Signed)
Influenza Vaccine Procedure  February 06, 2017    1. Has the patient received the information for the influenza vaccine? Yes    2. Does the patient have any of the following contraindications?  Allergy to eggs? No  Allergic reaction to previous influenza vaccines? No  Any other problems to previous influenza vaccines? No  Paralyzed by Guillain-Barre syndrome?  No  Current moderate or severe illness? No  Allergy to contact lens solution? No    3. The vaccine has been administered in the usual fashion.     Immunization information reviewed. Current VIS reviewed and given to patient/ guardian. Verbal assent obtained from patient/ guardian.  See immunization/Injection module or chart review for date of publication and additional information. Verbal assent obtained from patient/guardian. Comfort measures for possible side effects reviewed.

## 2017-02-06 NOTE — Progress Notes (Signed)
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     Chronic left hip pain        Current Outpatient Medications:  simvastatin (ZOCOR) 10 MG tablet Take 1 tablet by mouth nightly Disp: 30 tablet Rfl: 11   amLODIPine (NORVASC) 5 MG tablet TAKE 1 TABLET BY MOUTH EVERY DAY Disp: 30 tablet Rfl: 1   FLUoxetine (PROZAC) 20 MG capsule Take 1 capsule by mouth daily Disp: 30 capsule Rfl: 11   gabapentin (NEURONTIN) 300 MG capsule Take 1 capsule by mouth 3 (three) times daily Disp: 90 capsule Rfl: 2   cholecalciferol (VITAMIN D3) 1000 UNIT tablet Take 1 tablet by mouth daily Disp: 30 tablet Rfl: 11   OTHER MEDICATION Isoflavones (A [place] OTC preparation to treat hot flashes) Disp:  Rfl:      No current facility-administered medications for this visit.   Facility-Administered Medications Ordered in Other Visits:  sodium chloride 0.9 % flush 10-100 mL 10-100 mL Intra-articular Once in imaging [place]     MED REC  - completed    RN NOTE: This past Sunday felt like she was having shortness of breath, woke up like this out of a sleep, felt like throat swollen, this is third time it has happened-took 2 benedryl and the swelling was relieved.  Patient is very concerned; Denies use of any new medications,foods or products  Woke up middle of night having some sob, no difficulty swallowing.  No other symptoms at times of SOB except Throat was sore  Relieved by benedryl  States throat was swollen, although denied difficulty swallowing  Works with a doctor-? Allergy to amlodipine-but has taken this for long time  First  episode was 4 months ago, second time 2 months ago, then this past Sunday  Did not eat out prior to these episodes or do anything different than normal    - received EPIPEN yesterday  - notes three episodes - first occurred ~ March 2018; second episode ~ June 2018; third episode episode was 4:00 AM on 02/04/17  - awoke with difficulty breathing and swollen glands  - improved after taking 2 Benadryl  - most recent episode was significantly worse than other episodes  - denies any new foods consumed - she does note that she ate pancake made from [facility] prior to going to bed at ~ 8:00 PM    LIPIDS  Last lipids were:  LOW DENSITY LIPOPROTEIN DIRECT (mg/dL)   Date Value   01/12/2017 191 (*H)   12/29/2016 211 (*H)     HIGH DENSITY LIPOPROTEIN (mg/dL)   Date Value   01/12/2017 58   12/29/2016 53         New medication = simvastatin - which was started ~ one week prior    Social History    Socioeconomic History      Marital status: Married  Spouse name: [name]      Number of children: 4      Years of education: Not on file      Highest education level: Not on file    Social Needs      Financial resource strain: Not on file      Food insecurity - worry: Not on file      Food insecurity - inability: Not on file      Transportation needs - medical: Not on file      Transportation needs - non-medical: Not on file    Occupational History      Occupation: Systems developer: [employer]        Comment: Works daytime    Tobacco Use      Smoking status: Former Smoker      Smokeless tobacco: Never Used    Substance and Sexual Activity      Alcohol use: Yes        Comment: Occasionally      Drug use: No      Sexual activity: Yes        Partners: Male        Birth control/protection: Surgical    Other Topics      Concerns:        Not on file    Social History Narrative      Immigrated from [place] in 2003    OBJECTIVE:  BP 120/70 (Site: RA, Position: Sitting, Cuff Size: Reg)  Pulse 81  Temp 98.6 F (37 C) (Oral)  Wt 71.7 kg  (158 lb)  LMP 06/08/2008  SpO2 100%  BMI 28 kg/m2  Pain Score: 0 (0/10)  Appearance: NAD  HEENT: NC/AT MMM   NECK: Supple; no LAD  LUNGS: CTA - no wheezing  CV: RRR s1s2 no m/r/g  ABD: soft NT/ND nl bs  RECTAL: deferred  EXTR: no cyanosis/clubbing/edema  NEURO: alert and oriented x 3; nl gait;    ASSESSMENT:PLAN:  (T78.40XA) Allergic reaction, initial encounter  (primary encounter diagnosis)  Comment: uncertain etiology. Unlikely to simvastatin, but advised to stop, since recently begun. More likely due to food ingestion? Different kind of [place]?  Plan: REFERRAL TO ALLERGY (EXT)        Pending evaluation.    Instructed as to when to use EPIPEN.     (Z23) Need for prophylactic vaccination and inoculation against influenza  Plan: IMMUNIZATION ADMIN SINGLE, RN, PR IIV4 VACC         PRESRV FREE 0.5 ML DOS FOR IM USE          Follow-up as above.  25+ minutes spent with patient; > 50% spent doing counseling.

## 2017-02-08 ENCOUNTER — Telehealth (HOSPITAL_BASED_OUTPATIENT_CLINIC_OR_DEPARTMENT_OTHER): Payer: Self-pay | Admitting: Ambulatory Care

## 2017-02-08 DIAGNOSIS — I10 Essential (primary) hypertension: Principal | ICD-10-CM

## 2017-02-08 NOTE — Progress Notes (Signed)
Left message with tel interpreter Junius Roads, that we are returning her call

## 2017-02-08 NOTE — Telephone Encounter (Signed)
-----   Message from [name] sent at 02/08/2017 10:36 AM EDT -----  Regarding: bp med  Contact: [phone]  KOREEN LIZAOLA [id number], 61 year old, female    Calls today:  Clinical Questions ([place])    Name of person calling self  Specific nature of request pt seen by Dr.Balaban on 02/06/17 was told to stop simvastaton and to start a new medicaiton pt wants to know what medication it is and if it was sent   I don't see any new meds sent   Return phone number [phone]  Person calling on behalf of patient: Patient (self)    CALL BACK NUMBER:   Best time to call back:   Cell phone:   Other phone:    Patient's language of care: [name]    Patient needs a [name] interpreter.    Patient's PCP: [name], APRN

## 2017-02-08 NOTE — Progress Notes (Signed)
Pt calling back. Needed interpreter able to get Salena Saner on the line  Says under her impression that Dr.Balaban will speak with [name] to see if she wanted the BP med amlodipine changed. Has 2 pills left so will need script sent to [facility]  She is not taking the simvastatin   Understands next time she has a reaction is to use the Epi-pen and go to ED

## 2017-02-09 MED ORDER — AMLODIPINE BESYLATE 5 MG PO TABS: 5 mg | tablet | Freq: Every day | ORAL | 11 refills | 0 days | Status: DC

## 2017-02-09 MED ORDER — AMLODIPINE BESYLATE 5 MG PO TABS
5.0000 mg | ORAL_TABLET | Freq: Every day | ORAL | 11 refills | Status: DC
Start: 2017-02-09 — End: 2018-02-07

## 2017-02-09 MED FILL — FLUOXETINE 20MG CAPS: 30 days supply | Qty: 30 | Fill #1 | Status: CP

## 2017-02-09 MED FILL — *AMLODIPINE 5MG: 30 days supply | Qty: 30 | Fill #0 | Status: CP

## 2017-02-09 NOTE — Progress Notes (Signed)
Called pt with tel interpreter and discussed. Repeated back the information correctly

## 2017-02-09 NOTE — Progress Notes (Signed)
As dicussed with Dr. Jill Alexanders    Pt was advised to hold Simvastatin pending evaluation with an Allergist    Once consult has been made per Allergist, will then determine lipid management     Pt can continue with Amlodipine as Rx, refill order placed

## 2017-02-26 ENCOUNTER — Ambulatory Visit (HOSPITAL_BASED_OUTPATIENT_CLINIC_OR_DEPARTMENT_OTHER): Payer: Medicaid Other | Admitting: Obstetrics & Gynecology

## 2017-03-10 MED FILL — *AMLODIPINE 5MG: 30 days supply | Qty: 30 | Fill #1 | Status: CP

## 2017-03-16 MED FILL — *CETIRIZINE 10MG: 30 days supply | Qty: 60 | Fill #0 | Status: CP

## 2017-03-16 MED FILL — RANITIDINE 150MG TAB: 30 days supply | Qty: 60 | Fill #0 | Status: CP

## 2017-03-19 ENCOUNTER — Ambulatory Visit (HOSPITAL_BASED_OUTPATIENT_CLINIC_OR_DEPARTMENT_OTHER): Payer: Medicaid Other | Admitting: Obstetrics & Gynecology

## 2017-03-20 ENCOUNTER — Ambulatory Visit (HOSPITAL_BASED_OUTPATIENT_CLINIC_OR_DEPARTMENT_OTHER): Payer: Medicaid Other | Admitting: Obstetrics & Gynecology

## 2017-04-09 ENCOUNTER — Other Ambulatory Visit (HOSPITAL_BASED_OUTPATIENT_CLINIC_OR_DEPARTMENT_OTHER): Payer: Self-pay | Admitting: Internal Medicine

## 2017-04-09 DIAGNOSIS — E559 Vitamin D deficiency, unspecified: Secondary | ICD-10-CM

## 2017-04-09 MED FILL — *AMLODIPINE 5MG: 30 days supply | Qty: 30 | Fill #2 | Status: CP

## 2017-04-09 MED FILL — CETIRIZINE 10MG: 30 days supply | Qty: 60 | Fill #1 | Status: CP

## 2017-04-09 MED FILL — RANITIDINE 150MG TAB: 30 days supply | Qty: 60 | Fill #1 | Status: CP

## 2017-04-09 NOTE — Progress Notes (Unsigned)
PER Pharmacy, Tina [name] is a 61 year old female has requested a refill of Vitamin D 1000u.      Last Office Visit: 02/06/17 with Dedra Skeens  Last Physical Exam: 10/27/16    Other Med Adult:  Most Recent BP Reading(s)  02/06/17 : 120/70        Cholesterol (mg/dL)   Date Value   01/12/2017 279 (*H)     LOW DENSITY LIPOPROTEIN DIRECT (mg/dL)   Date Value   01/12/2017 191 (*H)     HIGH DENSITY LIPOPROTEIN (mg/dL)   Date Value   01/12/2017 58     TRIGLYCERIDES (mg/dL)   Date Value   01/12/2017 108         THYROID SCREEN TSH REFLEX FT4 (uIU/mL)   Date Value   12/29/2016 1.190         TSH (THYROID STIM HORMONE) (uIU/mL)   Date Value   09/23/2010 1.52       No results found for: HGBA1C    No results found for: POCA1C      No results found for: INR    SODIUM (mmol/L)   Date Value   12/29/2016 142       POTASSIUM (mmol/L)   Date Value   01/12/2017 4.0           CREATININE (mg/dL)   Date Value   12/29/2016 0.7       Documented patient preferred pharmacies:    [facility], [place] - [address]  Phone: [phone] Fax: [phone]

## 2017-04-10 ENCOUNTER — Ambulatory Visit (HOSPITAL_BASED_OUTPATIENT_CLINIC_OR_DEPARTMENT_OTHER): Payer: Medicaid Other | Admitting: Obstetrics & Gynecology

## 2017-04-10 VITALS — BP 131/83 | Wt 153.5 lb

## 2017-04-10 DIAGNOSIS — N950 Postmenopausal bleeding: Secondary | ICD-10-CM

## 2017-04-10 NOTE — Progress Notes (Signed)
Pt here for PMB  Menopause for 6-7 years  First episode of bleeding 3 months ago, had pain in uterus  Had 2 more episodes, last one 2 weeks ago, had pain, only small spotting one  G4P4  1 c/s with TL,  3 SVD  6/18 pap and hpv neg  No hx of abn paps or STIs  Not currently SA  Takes norvasc and prozac ( hot flashes)  Has HTN   In addition to c/s, had l/s with removal of part of uterus and appy  Nonsmoker  fam hx aunt with breast cancer, passed  9/18 neg mammo  8/18 US  Uterus: 6.4 x 3.3 x 4 point cm. The myometrium shows a 14 x 14 x 15 mm    intramural fundal fibroid.      Endometrium: 2.5 mm. Appearance is unremarkable.      Right ovary: Not seen. No adnexal abnormality      Left ovary: Not seen. No adnexal abnormality      Free fluid: None.      Cervix: Nabothian cyst      Kidneys: No hydronephrosis      IMPRESSION:      The endometrium is thin and uniform. Neither ovary could be    visualized.      Reviewed [place] in detail, nl  Plan expectant mgmt, if persistent bleeding may consider biopsy 2-3 months v hysteroscopy  I have spent 30 minutes in face to face time with this patient/patient proxy of which > 50% was in counseling or coordination of care regarding above issues/Dx.

## 2017-05-12 MED FILL — SIMVASTATIN 10MG: 30 days supply | Qty: 30 | Fill #1 | Status: CP

## 2017-05-12 MED FILL — FLUOXETINE   20MG: 30 days supply | Qty: 30 | Fill #2 | Status: CP

## 2017-05-12 MED FILL — CETIRIZINE 10MG: 30 days supply | Qty: 60 | Fill #2 | Status: CP

## 2017-05-12 MED FILL — *AMLODIPINE 5MG: 30 days supply | Qty: 30 | Fill #3 | Status: CP

## 2017-06-09 MED FILL — *AMLODIPINE 5MG: 30 days supply | Qty: 30 | Fill #4 | Status: CP

## 2017-06-26 ENCOUNTER — Ambulatory Visit (HOSPITAL_BASED_OUTPATIENT_CLINIC_OR_DEPARTMENT_OTHER): Payer: Self-pay | Admitting: Obstetrics & Gynecology

## 2017-07-10 MED FILL — *AMLODIPINE 5MG: 30 days supply | Qty: 30 | Fill #5 | Status: CP

## 2017-07-17 ENCOUNTER — Ambulatory Visit (HOSPITAL_BASED_OUTPATIENT_CLINIC_OR_DEPARTMENT_OTHER): Payer: Self-pay | Admitting: Obstetrics & Gynecology

## 2017-08-10 MED FILL — *AMLODIPINE 5MG: 30 days supply | Qty: 30 | Fill #6 | Status: CP

## 2017-08-10 MED FILL — FLUOXETINE   20MG: 30 days supply | Qty: 30 | Fill #3 | Status: CP

## 2017-09-03 MED FILL — *AMLODIPINE 5MG: 30 days supply | Qty: 30 | Fill #7 | Status: CP

## 2017-09-03 MED FILL — FLUOXETINE   20MG: 30 days supply | Qty: 30 | Fill #4 | Status: CP

## 2017-09-24 MED FILL — ACETAMINOPHN SUS 160/5ML: 3 days supply | Qty: 118 | Fill #0 | Status: CP

## 2017-09-24 MED FILL — MURINE EAR DRO 6.5% OT: 30 days supply | Qty: 15 | Fill #0 | Status: CP

## 2017-10-04 MED FILL — *AMLODIPINE 5MG: 30 days supply | Qty: 30 | Fill #8 | Status: CP

## 2017-10-04 MED FILL — FLUOXETINE   20MG: 30 days supply | Qty: 30 | Fill #5 | Status: CP

## 2017-10-12 ENCOUNTER — Ambulatory Visit (HOSPITAL_BASED_OUTPATIENT_CLINIC_OR_DEPARTMENT_OTHER): Payer: Medicaid Other | Admitting: Internal Medicine

## 2017-11-09 MED FILL — FLUOXETINE   20MG: 30 days supply | Qty: 30 | Fill #6 | Status: CP

## 2017-11-09 MED FILL — *AMLODIPINE 5MG: 30 days supply | Qty: 30 | Fill #9 | Status: CP

## 2017-12-10 MED FILL — *AMLODIPINE 5MG: 30 days supply | Qty: 30 | Fill #10 | Status: CP

## 2017-12-13 ENCOUNTER — Other Ambulatory Visit (HOSPITAL_BASED_OUTPATIENT_CLINIC_OR_DEPARTMENT_OTHER): Payer: Self-pay | Admitting: Internal Medicine

## 2017-12-13 DIAGNOSIS — R232 Flushing: Secondary | ICD-10-CM

## 2017-12-13 NOTE — Progress Notes (Signed)
PER Pharmacy, Tina [name] is a 62 year old female has requested a refill of fluoxetine    Last Office Visit: 02/06/17 with Dedra Skeens  Last Physical Exam: 10/27/16    Other Med Adult:  Most Recent BP Reading(s)  02/06/17 : 120/70            Cholesterol (mg/dL)   Date Value   01/12/2017 279 (*H)         LOW DENSITY LIPOPROTEIN DIRECT (mg/dL)   Date Value   01/12/2017 191 (*H)         HIGH DENSITY LIPOPROTEIN (mg/dL)   Date Value   01/12/2017 58         TRIGLYCERIDES (mg/dL)   Date Value   01/12/2017 108             THYROID SCREEN TSH REFLEX FT4 (uIU/mL)   Date Value   12/29/2016 1.190             TSH (THYROID STIM HORMONE) (uIU/mL)   Date Value   09/23/2010 1.52       No results found for: HGBA1C    No results found for: POCA1C      No results found for: INR        SODIUM (mmol/L)   Date Value   12/29/2016 142           POTASSIUM (mmol/L)   Date Value   01/12/2017 4.0               CREATININE (mg/dL)   Date Value   12/29/2016 0.7       Documented patient preferred pharmacies:    [facility], [place] - [address]. [address]  Phone: [phone] Fax: [phone]

## 2017-12-14 MED FILL — FLUOXETINE   20MG: 30 days supply | Qty: 30 | Fill #0 | Status: CP

## 2017-12-20 ENCOUNTER — Ambulatory Visit
Admission: RE | Admit: 2017-12-20 | Discharge: 2017-12-20 | Disposition: A | Discharge status: Home / Self Care | Payer: No Typology Code available for payment source | Attending: Nurse Practitioner | Admitting: Nurse Practitioner

## 2017-12-20 ENCOUNTER — Ambulatory Visit (HOSPITAL_BASED_OUTPATIENT_CLINIC_OR_DEPARTMENT_OTHER): Payer: No Typology Code available for payment source | Admitting: Nurse Practitioner

## 2017-12-20 ENCOUNTER — Ambulatory Visit (HOSPITAL_BASED_OUTPATIENT_CLINIC_OR_DEPARTMENT_OTHER): Payer: Self-pay | Admitting: Registered Nurse

## 2017-12-20 ENCOUNTER — Encounter (HOSPITAL_BASED_OUTPATIENT_CLINIC_OR_DEPARTMENT_OTHER): Payer: Self-pay | Admitting: Nurse Practitioner

## 2017-12-20 VITALS — BP 140/80 | HR 66 | Temp 98.9°F | Ht 62.6 in | Wt 153.4 lb

## 2017-12-20 DIAGNOSIS — R42 Dizziness and giddiness: Secondary | ICD-10-CM

## 2017-12-20 DIAGNOSIS — R0781 Pleurodynia: Secondary | ICD-10-CM | POA: Diagnosis not present

## 2017-12-20 DIAGNOSIS — J301 Allergic rhinitis due to pollen: Secondary | ICD-10-CM

## 2017-12-20 DIAGNOSIS — Z1211 Encounter for screening for malignant neoplasm of colon: Secondary | ICD-10-CM

## 2017-12-20 DIAGNOSIS — E7800 Pure hypercholesterolemia, unspecified: Secondary | ICD-10-CM

## 2017-12-20 DIAGNOSIS — R0789 Other chest pain: Secondary | ICD-10-CM

## 2017-12-20 LAB — CBC, PLATELET & DIFFERENTIAL
ABSOLUTE BASO COUNT: 0 10*3/uL (ref 0.0–0.1)
ABSOLUTE EOSINOPHIL COUNT: 0.2 10*3/uL (ref 0.0–0.8)
ABSOLUTE IMM GRAN COUNT: 0.01 10*3/uL (ref 0.00–0.03)
ABSOLUTE LYMPH COUNT: 2.6 10*3/uL (ref 0.6–5.9)
ABSOLUTE MONO COUNT: 0.5 10*3/uL (ref 0.2–1.4)
ABSOLUTE NEUTROPHIL COUNT: 3.6 10*3/uL (ref 1.6–8.3)
BASOPHIL %: 0.3 % (ref 0.0–1.2)
EOSINOPHIL %: 3.3 % (ref 0.0–7.0)
HEMATOCRIT: 41.2 % (ref 34.1–44.9)
HEMOGLOBIN: 13.4 g/dL (ref 11.2–15.7)
IMMATURE GRANULOCYTE %: 0.1 % (ref 0.0–0.4)
LYMPHOCYTE %: 37.5 % (ref 15.0–54.0)
MEAN CORP HGB CONC: 32.5 g/dL (ref 31.0–37.0)
MEAN CORPUSCULAR HGB: 30.7 pg (ref 26.0–34.0)
MEAN CORPUSCULAR VOL: 94.5 fL (ref 80.0–100.0)
MEAN PLATELET VOLUME: 12.8 fL — ABNORMAL HIGH (ref 8.7–12.5)
MONOCYTE %: 6.7 % (ref 4.0–13.0)
NEUTROPHIL %: 52.1 % (ref 40.0–75.0)
PLATELET COUNT: 221 10*3/uL (ref 150–400)
RBC DISTRIBUTION WIDTH STD DEV: 43.3 fL (ref 35.1–46.3)
RBC DISTRIBUTION WIDTH: 13 % (ref 11.5–14.3)
RED BLOOD CELL COUNT: 4.36 M/uL (ref 3.90–5.20)
WHITE BLOOD CELL COUNT: 7 10*3/uL (ref 4.0–11.0)

## 2017-12-20 LAB — COMPREHENSIVE METABOLIC PANEL
ALANINE AMINOTRANSFERASE: 36 U/L (ref 12–45)
ALBUMIN: 3.7 g/dL (ref 3.4–5.0)
ALKALINE PHOSPHATASE: 101 U/L (ref 45–117)
ANION GAP: 10 mmol/L (ref 5–15)
ASPARTATE AMINOTRANSFERASE: 22 U/L (ref 8–34)
BILIRUBIN TOTAL: 0.2 mg/dL (ref 0.2–1.0)
BUN (UREA NITROGEN): 12 mg/dL (ref 7–18)
CALCIUM: 8.9 mg/dL (ref 8.5–10.1)
CARBON DIOXIDE: 29 mmol/L (ref 21–32)
CHLORIDE: 103 mmol/L (ref 98–107)
CREATININE: 0.7 mg/dL (ref 0.4–1.2)
ESTIMATED GLOMERULAR FILT RATE: >60 mL/min (ref >60)
Glucose Random: 97 mg/dL (ref 74–160)
POTASSIUM: 4.2 mmol/L (ref 3.5–5.1)
SODIUM: 142 mmol/L (ref 136–145)
TOTAL PROTEIN: 7.1 g/dL (ref 6.4–8.2)

## 2017-12-20 LAB — THYROID SCREEN TSH REFLEX FT4: THYROID SCREEN TSH REFLEX FT4: 1.5 u[IU]/mL (ref 0.358–3.740)

## 2017-12-20 LAB — LIPID PANEL
Cholesterol: 270 mg/dL (ref 0–239)
HIGH DENSITY LIPOPROTEIN: 51 mg/dL (ref 40–?)
LOW DENSITY LIPOPROTEIN DIRECT: 189 mg/dL (ref 0–189)
TRIGLYCERIDES: 214 mg/dL — ABNORMAL HIGH (ref 0–150)

## 2017-12-20 LAB — RBCMORPH

## 2017-12-20 MED ORDER — FLUTICASONE PROPIONATE 50 MCG/ACT NA SUSP
2.00 | Freq: Every day | NASAL | 2 refills | Status: AC
Start: 2017-12-20 — End: 2018-06-19

## 2017-12-20 MED ORDER — FLUTICASONE PROPIONATE 50 MCG/ACT NA SUSP: 2 | Bottle | Freq: Every day | NASAL | 2 refills | 0 days | Status: AC

## 2017-12-20 MED FILL — *FLUTICASONE SPR 50MCG: 30 days supply | Qty: 16 | Fill #0 | Status: CP

## 2017-12-20 NOTE — Progress Notes (Signed)
[facility]    CC: dizziness and right rib pain, moving to back sometimes      HPI: Tina [name] is a 62 year old year old female who presents to clinic today, feels she is overdue for care, and with many concerns      most concerned today with pain over her right rib cage, and this morning when then stood up, and felt her vision go "dark". She sat down so she would not fall. And this sensation passed.    Pain over ribs  For one month. Has been Taken ibuprofen every 8 hours (400 mg), with some improvement.  Pain tho is now over ribs and to back at same level into back  Pain is now a 5 initially a 9. Has been thinking it was a muscle pull.  Pain is constant. Denies fall, trauma, heavy lifting.  Not related to eating. Has it all day. Sore to touch.  Not worse with eating. When she takes a deep breath pain increases  No change with movement.  But very sore to touch this area     Pre syncopal episode has occurred in the past  Has mentioned to PCP she says. Occurred once 1-2 yrs ago.  No fever, chills, no sinus congestion, ear blockage, no sore throat, cough.  A little runny nose.    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                No signs of infection on the right. Sent for hearing                test.   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   HENT: Negative for congestion, ear discharge, nosebleeds, sinus pain and sore throat.    Eyes: Negative for blurred vision, double vision and photophobia.   Respiratory: Negative for cough and shortness of breath.    Cardiovascular: Negative for chest pain, palpitations and leg swelling.   Gastrointestinal: Negative for abdominal pain, blood in stool, constipation, diarrhea, heartburn, nausea and vomiting.   Genitourinary:  Negative for dysuria, frequency and urgency.   Musculoskeletal: Positive for myalgias. Negative for falls.   Skin: Negative for rash.   Neurological: Positive for dizziness. Negative for tingling, tremors, sensory change, speech change, focal weakness, loss of consciousness, weakness and headaches.   Endo/Heme/Allergies: Does not bruise/bleed easily.     Physical Exam   Constitutional: She is oriented to person, place, and time.   BP (!) 140/80 (Site: RA, Position: Sitting, Cuff Size: Lrg)  Pulse 66  Temp 98.9 F (37.2 C) (Oral)  Ht 5' 2.6" (1.59 m)  Wt 69.6 kg (153 lb 6.4 oz)  LMP 06/08/2008  SpO2 98%  BMI 27.52 kg/m2  Well appearing 62 yo woman  Off statin rel to previous se's  Neck supple  Head neck nodes neg  Thyroid wnl size, smooth, symmetric, non tender  S1 and S2 normal, no murmurs, clicks, gallops or rubs. Regular rate and rhythm. Chest is clear; no wheezes or rales. No edema or JVD. No pain with breathing while here today.  Ears scarring, otherwise neg  Pharynx red, no exudate.  abd soft, 2+ tenderness over right lowest rib, lesser above this point.  Pain at epigastrum seems related to this above tenderness, no pain under ribs, or organomegally detected.  bs +  No  tenderness to flank or back  No pedal edema    Lovely high energy, very talkative 62 yo woman    Cranial nerves 2-12 tested, nml    Alert, oriented       Neurological: She is alert and oriented to person, place, and time.     ASSESSMENT:  (R42) Dizziness  (primary encounter diagnosis)  Comment: nml ekg  ? Allergic rhinitis    Plan: EKG, CBC, PLATELET & DIFFERENTIAL,         COMPREHENSIVE METABOLIC PANEL, THYROID SCREEN         TSH REFLEX FT4        Push po fluids, discussed.      (J30.1) Seasonal allergic rhinitis due to pollen  Comment:   Plan: fluticasone (FLONASE) 50 MCG/ACT nasal spray--- resume, has used in past and to take a non sedating antihistamine daily--does often            (E78.00) Pure hypercholesterolemia  Comment:   Plan: LIPID PANEL, COLLECTION VENOUS BLOOD         VENIPUNCTURE           (R07.81) Rib pain on right side  Comment: ?MS in nature, unclear etiology. No known precipitants or other associated symtpoms  Not tachycardic, o2 sat is nml,  No pain with breathing observed here today  Plan: XR RIBS RIGHT WITH PA & LATERAL CHEST            (Z12.11) Screen for colon cancer  Comment:   Plan: POC IMMUNOASSAY FECAL OCCULT BLOOD TEST              Medications were reviewed and discussed including the most common side effects of any newly prescribed medications. Adherence barriers were identified and assessed. I have reviewed the past medical, surgical, social and family history EpicCare as relevant. All interim labs, test results, and consult notes were reviewed and discussed with the patient. All questions fully answered. She will call me if any problems arise.    Fu with pcp one week

## 2017-12-20 NOTE — Telephone Encounter (Signed)
Regarding: dizzy   ----- Message from [name] sent at 12/20/2017 11:09 AM EDT -----  Tina [name] [id number], 62 year old, female    Calls today:  Sick    What are the symptoms dizzy, blurry vision pain right side rib cage   How long has patient been sick? today  What has pt. tried at home   Person calling on behalf of patient: Patient (self)    CALL BACK NUMBER: [phone]  Best time to call back:   Cell phone:   Other phone:    Patient's language of care: [name]    Patient needs a [name] interpreter.    Patient's PCP: [name], APRN

## 2017-12-20 NOTE — Telephone Encounter (Signed)
Tel call to pt w/ [name] interpreter re c/o dizziness, blurred vision, and rib pain.   Pt reports dizziness started yesterday, only in AM, resolved, not an intense episode.  This morning when getting out of bed pt reports having to hold onto side of bed, felt like she was going to pass out, also accompanied w/ blurred vision.   No sensation of room spinning. Does not see stars.   Episode resolved on its own within seconds, has not happened again this morning.   Does not measure BP at home.   Endorses taking amlodipine daily as ordered.   Denies CP/palps/headache.   Endorses adequate PO intake, doesn't think possibility of dehydration.    Also reporting pain under R breast, feels muscular/achy in nature, reports pain started approx 1 mo ago, intermittent episodes but has been happening more frequently, resolves w/ ibuprofen   Scheduled to see Sula Soda this afternoon.  Advised if pt develops CP or additional episodes of worsening dizziness, present to ED.           Reason for Disposition  . [1] MODERATE dizziness (e.g., interferes with normal activities) AND [2] has NOT been evaluated by physician for this  (Exception: dizziness caused by heat exposure, sudden standing, or poor fluid intake)  . Taking a medicine that could cause dizziness (e.g., blood pressure medications, diuretics)    Answer Assessment - Initial Assessment Questions  1. DESCRIPTION: "Describe your dizziness."      Faint-like this morning OOB  2. LIGHTHEADED: "Do you feel lightheaded?" (e.g., somewhat faint, woozy, weak upon standing)      Faint, woozy  3. VERTIGO: "Do you feel like either you or the room is spinning or tilting?" (i.e. vertigo)      yes  4. SEVERITY: "How bad is it?"  "Do you feel like you are going to faint?" "Can you stand and walk?"    - MILD - walking normally    - MODERATE - interferes with normal activities (e.g., work, school)     - SEVERE - unable to stand, requires support to walk, feels like passing out now.       Moderate  this AM   5. ONSET:  "When did the dizziness begin?"      This morning   6. AGGRAVATING FACTORS: "Does anything make it worse?" (e.g., standing, change in head position)      Standing/change in position exacerbated this morning  7. HEART RATE: "Can you tell me your heart rate?" "How many beats in 15 seconds?"  (Note: not all patients can do this)        unsure  8. CAUSE: "What do you think is causing the dizziness?"      unsure  9. RECURRENT SYMPTOM: "Have you had dizziness before?" If so, ask: "When was the last time?" "What happened that time?"      Yes, yesterday  10. OTHER SYMPTOMS: "Do you have any other symptoms?" (e.g., fever, chest pain, vomiting, diarrhea, bleeding)        no  11. PREGNANCY: "Is there any chance you are pregnant?" "When was your last menstrual period?"        No    Protocols used: ADULT DIZZINESS - [facility]

## 2017-12-21 LAB — EKG

## 2018-01-03 ENCOUNTER — Ambulatory Visit: Payer: No Typology Code available for payment source | Attending: Internal Medicine | Admitting: Lab

## 2018-01-03 DIAGNOSIS — Z1211 Encounter for screening for malignant neoplasm of colon: Secondary | ICD-10-CM | POA: Diagnosis present

## 2018-01-03 LAB — POC IMMUNOASSAY FECAL OCCULT BLOOD TEST: POC FECAL OCCULT BLOOD TEST (IMMUNOASSAY): NEGATIVE

## 2018-01-03 NOTE — Progress Notes (Signed)
FOBT kit dropped off

## 2018-01-04 ENCOUNTER — Ambulatory Visit: Payer: No Typology Code available for payment source | Attending: Internal Medicine | Admitting: Internal Medicine

## 2018-01-04 ENCOUNTER — Encounter (HOSPITAL_BASED_OUTPATIENT_CLINIC_OR_DEPARTMENT_OTHER): Payer: Self-pay | Admitting: Nurse Practitioner

## 2018-01-04 ENCOUNTER — Encounter (HOSPITAL_BASED_OUTPATIENT_CLINIC_OR_DEPARTMENT_OTHER): Payer: Self-pay | Admitting: Internal Medicine

## 2018-01-04 VITALS — BP 122/76 | HR 74 | Temp 98.3°F | Ht 62.6 in | Wt 155.0 lb

## 2018-01-04 DIAGNOSIS — R42 Dizziness and giddiness: Secondary | ICD-10-CM

## 2018-01-04 DIAGNOSIS — E785 Hyperlipidemia, unspecified: Secondary | ICD-10-CM | POA: Diagnosis present

## 2018-01-04 DIAGNOSIS — M8588 Other specified disorders of bone density and structure, other site: Secondary | ICD-10-CM | POA: Diagnosis present

## 2018-01-04 MED ORDER — CALCIUM CARBONATE-VITAMIN D 600-400 MG-UNIT PO TABS: 1 | tablet | Freq: Two times a day (BID) | ORAL | 11 refills | 0 days | Status: AC

## 2018-01-04 MED ORDER — CALCIUM CARBONATE-VITAMIN D 600-400 MG-UNIT PO TABS
1.00 | ORAL_TABLET | Freq: Two times a day (BID) | ORAL | 11 refills | Status: AC
Start: 2018-01-04 — End: 2019-01-04

## 2018-01-04 MED FILL — CALCIUM/D  600-400: 30 days supply | Qty: 60 | Fill #0 | Status: CP

## 2018-01-04 NOTE — Progress Notes (Signed)
SUBJECTIVE:    Tina [name] is a 62 y.o who presents to clinic for f/u    Was seen in clinic 12/20/17 with c/c of right rib cage pain and pre syncopal episode    Had a rib x ray with impression of     Ribs: No visible rib fractures. Mild degenerative changes of the spine. Mild osteopenia.  Lungs/ Pleura: There is no pneumothorax. There is no pleural effusion. No consolidation or pulmonary edema.   Heart: Cardiac silhouette is unremarkable.  Mediastinum/hila: Unremarkable  Impression:   1. No acute displaced rib fractures.  2. No acute cardiopulmonary findings    Normal EKG, NSR    Labs done 12/20/17 CBC, CMP, TSH, IFOB-- Normal    Elevated lipids     Ref. Range 12/20/2017 15:02   Cholesterol Latest Ref Range: 0 - 239 mg/dL 270 (*H)   TRIGLYCERIDES Latest Ref Range: 0 - 150 mg/dL 214 (H)   HIGH DENSITY LIPOPROTEIN Latest Ref Range: 40- mg/dL 51   LOW DENSITY LIPOPROTEIN DIRECT Latest Ref Range: 0 - 189 mg/dL 189         At this visit, continues to have right rib pain. Last episode of her right rib pain  occurred 2 days ago.   Also reports having hx of dizziness. Past 2 weeks reports having dizziness, fatigue and generalized sweating.  States her episodes of sweating and fatigue have improved.   Dizziness- states dizziness occurs when she exerts herself. States her dizziness last seconds. Had an episode of vomiting 2 month ago with a dizziness episode. Hx of  labyrinthitis  Denies tinnitus, ear pain, headaches.  Has checked her blood sugar with her husbands glucometer when she has felt dizziness and reports having a blood sugar of 112 fasting. Denies family hx of DM.   + family hx of Vertigo- sister., mother and brother     Denies fevers, chills, body aches, chest pain, SOB, n/v, abdominal pain, numbness, tingling, change in bowel and urinary patterns         OBJECTIVE:  General: A+0x3,NAD  BP 122/76 (Site: LA, Position: Sitting, Cuff Size: Reg)  Pulse 74  Temp 98.3 F (36.8 C) (Oral)  Ht 5' 2.6" (1.59 m)  Wt  70.3 kg (155 lb)  LMP 06/08/2008  SpO2 96%  BMI 27.81 kg/m2  BP 122/76 (Site: LA, Position: Sitting, Cuff Size: Reg)  Pulse 74  Temp 98.3 F (36.8 C) (Oral)  Ht 5' 2.6" (1.59 m)  Wt 70.3 kg (155 lb)  LMP 06/08/2008  SpO2 98%  BMI 27.81 kg/m2   Extended vitals:  Repeat Blood Pressure:  BP Pulse Site Cuff Size Time Date   122/76 74 Left Arm Regular 11:12 AM 01/04/2018     Orthostatic Vitals  BP Pulse Position Site Cuff Size Time Date   122/74 64 Supine Left Arm Regular 11:49 AM 01/04/2018   126/76 66 Sitting Left Arm Regular 11:51 AM 01/04/2018   128/78 68 Standing Right Arm Regular 11:53 AM 01/04/2018     Pain Information  Score Location Time Date   0 (0/10) --- 11:12 AM 01/04/2018   No peak flow data filed.    HEENT: NT/AT PERRLA,  TMs intact, no perforation, erythema  or effusion noted, Throat clear,MMM, no exudate noted. Smile is symmetrical, tongue is midline  NECK: supple, no nodules, negative lymphadenopathy  CHEST: Clear to auscultation, no wheezes or rales.  CV: RRR, S1, S2 WNL, no murmurs  Neuro: CN II-XII intact, steady gait, normal reflexes  ASSESSMENT/PLAN  (R42) Chronic vertigo  (primary encounter diagnosis)  Comment: hx of chronic vertigo  Will refer to ENT  Plan: HEMOGLOBIN A1C, REFERRAL TO ENT ( INT)            (E78.5) Hyperlipidemia LDL goal < 130  Comment:  Plan: LIPID PANEL           (M85.88) Osteopenia of spine  Comment:  Plan: Calcium Carbonate-Vitamin D (CALCIUM 600+D)         600-400 MG-UNIT TABS Tablet           I have spent 25 minutes in face to face time with this patient/patient proxy of which > 50% was in counseling or coordination of care regarding above issues/Dx.

## 2018-01-07 ENCOUNTER — Ambulatory Visit: Payer: No Typology Code available for payment source | Attending: Family Medicine

## 2018-01-07 DIAGNOSIS — R42 Dizziness and giddiness: Secondary | ICD-10-CM | POA: Insufficient documentation

## 2018-01-07 DIAGNOSIS — E785 Hyperlipidemia, unspecified: Principal | ICD-10-CM

## 2018-01-07 LAB — LIPID PANEL
Cholesterol: 291 mg/dL (ref 0–239)
HIGH DENSITY LIPOPROTEIN: 62 mg/dL (ref 40–?)
LOW DENSITY LIPOPROTEIN DIRECT: 198 mg/dL (ref 0–189)
TRIGLYCERIDES: 93 mg/dL (ref 0–150)

## 2018-01-07 MED FILL — *AMLODIPINE 5MG: 30 days supply | Qty: 30 | Fill #11 | Status: CP

## 2018-01-07 MED FILL — FLUOXETINE 20MG CAPS: 30 days supply | Qty: 30 | Fill #1 | Status: CP

## 2018-01-07 NOTE — Progress Notes (Signed)
Labs drawn.  Tina [name], [place], 01/07/2018

## 2018-01-08 LAB — HEMOGLOBIN A1C
ESTIMATED AVERAGE GLUCOSE: 114 (ref 74–160)
HEMOGLOBIN A1C: 5.6 % (ref 4.0–5.6)

## 2018-01-09 ENCOUNTER — Other Ambulatory Visit (HOSPITAL_BASED_OUTPATIENT_CLINIC_OR_DEPARTMENT_OTHER): Payer: Self-pay | Admitting: Internal Medicine

## 2018-01-09 DIAGNOSIS — E785 Hyperlipidemia, unspecified: Principal | ICD-10-CM

## 2018-01-09 NOTE — Progress Notes (Signed)
Hello RNs    Please call pt    Her lipid panel is abnormal    I will like to start pt on a statin    I would like to Rx Lipitor 10 mg daily    Is noted allergy to Simvastatin- palpitations,   "glottis swollen" in past, but says allergy tested, not allergic to this med per pt 8/19    Please confirm pt does not have true adverse reaction of Simvastatin    Hemoglobin A1C 5.6% -- does not have DM      Will also need to come back to clinic in 6 weeks to repeat lipid panel fasting and lfts    Thank you  Hoyle Sauer

## 2018-01-09 NOTE — Progress Notes (Signed)
Tel call to pt w/ [name] interpreter  Reviewed lipid panel abnormal, PCP recommending statin   Pt reports abnormal reaction to simvastatin in the past - reports nausea and palpitations, can't remember exactly but remembers trying medication 2x and both times had adverse rxns, would like a different medication.   Reviewed hga1c 5.65, no DM   Reviewed need for repeat labs in 6 wks once pt stated on med for cholesterol - will forward to PCP for alternative.   Verified [facility] in [place] is preferred.

## 2018-01-10 MED ORDER — GEMFIBROZIL 600 MG PO TABS
600.0000 mg | ORAL_TABLET | Freq: Two times a day (BID) | ORAL | 11 refills | Status: DC
Start: 2018-01-10 — End: 2018-11-26

## 2018-01-10 MED ORDER — GEMFIBROZIL 600 MG PO TABS: 600 mg | tablet | Freq: Two times a day (BID) | ORAL | 11 refills | 0 days | Status: AC

## 2018-01-10 MED FILL — GEMFIBROZIL 600MG: 30 days supply | Qty: 60 | Fill #0 | Status: CP

## 2018-01-10 NOTE — Progress Notes (Signed)
Hello Rns    Due to pt hx of adverse reaction of statins    Will RX gemfibrozil (LOPID) 600 MG tablet BID    Will repeat lipids  And check LFTs in 6-8 weeks.     Thank you  Hoyle Sauer                 .

## 2018-01-10 NOTE — Progress Notes (Signed)
Tel call to pt w/ [name] interpreter re rx for 600mg  lopid BID dt adverse rxn to simvastatin.   Verified pharmacy  Reviewed plan to repeat to LFTs and fasting lipid panel in 6-8 weeks.   Will set reminder to call pt.   No add'l questions at this time.

## 2018-02-07 ENCOUNTER — Other Ambulatory Visit (HOSPITAL_BASED_OUTPATIENT_CLINIC_OR_DEPARTMENT_OTHER): Payer: Self-pay | Admitting: Internal Medicine

## 2018-02-07 DIAGNOSIS — I10 Essential (primary) hypertension: Secondary | ICD-10-CM

## 2018-02-07 MED FILL — FLUOXETINE 20MG CAPS: 30 days supply | Qty: 30 | Fill #2 | Status: CP

## 2018-02-07 MED FILL — FLUTICASONE SPR 50MCG: 30 days supply | Qty: 16 | Fill #1 | Status: CP

## 2018-02-07 MED FILL — GEMFIBROZIL 600MG: 30 days supply | Qty: 60 | Fill #1 | Status: CP

## 2018-02-07 MED FILL — CALCIUM/D  600-400: 30 days supply | Qty: 60 | Fill #1 | Status: CP

## 2018-02-07 NOTE — Progress Notes (Signed)
PER Pharmacy, Tina [name] is a 62 year old female has requested a refill of amlodipine.      Last Office Visit: 01/04/2018 with Charlean Merl  Last Physical Exam: 10/27/2016    There are no preventive care reminders to display for this patient.    Other Med Adult:  Most Recent BP Reading(s)  01/04/18 : 122/76        Cholesterol (mg/dL)   Date Value   01/07/2018 291 (*H)     LOW DENSITY LIPOPROTEIN DIRECT (mg/dL)   Date Value   01/07/2018 198 (*H)     HIGH DENSITY LIPOPROTEIN (mg/dL)   Date Value   01/07/2018 62     TRIGLYCERIDES (mg/dL)   Date Value   01/07/2018 93         THYROID SCREEN TSH REFLEX FT4 (uIU/mL)   Date Value   12/20/2017 1.500         TSH (THYROID STIM HORMONE) (uIU/mL)   Date Value   09/23/2010 1.52       HEMOGLOBIN A1C (%)   Date Value   01/07/2018 5.6       No results found for: POCA1C      No results found for: INR    SODIUM (mmol/L)   Date Value   12/20/2017 142       POTASSIUM (mmol/L)   Date Value   12/20/2017 4.2           CREATININE (mg/dL)   Date Value   12/20/2017 0.7       Documented patient preferred pharmacies:    [facility] [place] - [place]. [address]  Phone: [phone] Fax: [phone]

## 2018-02-08 MED FILL — *AMLODIPINE 5MG: 30 days supply | Qty: 30 | Fill #0 | Status: CP

## 2018-03-08 ENCOUNTER — Other Ambulatory Visit (HOSPITAL_BASED_OUTPATIENT_CLINIC_OR_DEPARTMENT_OTHER): Payer: Self-pay | Admitting: Otolaryngology

## 2018-03-11 MED FILL — *AMLODIPINE 5MG: 30 days supply | Qty: 30 | Fill #1 | Status: CP

## 2018-03-11 MED FILL — CETIRIZINE 10MG: 30 days supply | Qty: 60 | Fill #3 | Status: CP

## 2018-03-11 MED FILL — CALCIUM/D  600-400: 30 days supply | Qty: 60 | Fill #2 | Status: CP

## 2018-03-11 MED FILL — FLUTICASONE SPR 50MCG: 30 days supply | Qty: 16 | Fill #2 | Status: CP

## 2018-03-11 MED FILL — GEMFIBROZIL 600MG: 30 days supply | Qty: 60 | Fill #2 | Status: CP

## 2018-03-11 MED FILL — FLUOXETINE 20MG CAPS: 30 days supply | Qty: 30 | Fill #3 | Status: CP

## 2018-03-12 ENCOUNTER — Encounter (HOSPITAL_BASED_OUTPATIENT_CLINIC_OR_DEPARTMENT_OTHER): Payer: Self-pay | Admitting: Otolaryngology

## 2018-04-08 MED FILL — AMLODIPINE 5MG: 30 days supply | Qty: 30 | Fill #2 | Status: CP

## 2018-04-08 MED FILL — FLUOXETINE 20MG CAPS: 30 days supply | Qty: 30 | Fill #4 | Status: CP

## 2018-04-08 MED FILL — GEMFIBROZIL 600MG: 30 days supply | Qty: 60 | Fill #3 | Status: CP

## 2018-04-26 ENCOUNTER — Encounter (HOSPITAL_BASED_OUTPATIENT_CLINIC_OR_DEPARTMENT_OTHER): Payer: Self-pay | Admitting: Ambulatory Care

## 2018-04-26 ENCOUNTER — Ambulatory Visit: Payer: No Typology Code available for payment source | Attending: Internal Medicine | Admitting: Nurse Practitioner

## 2018-04-26 ENCOUNTER — Encounter (HOSPITAL_BASED_OUTPATIENT_CLINIC_OR_DEPARTMENT_OTHER): Payer: Self-pay | Admitting: Nurse Practitioner

## 2018-04-26 ENCOUNTER — Ambulatory Visit (HOSPITAL_BASED_OUTPATIENT_CLINIC_OR_DEPARTMENT_OTHER): Payer: Self-pay

## 2018-04-26 VITALS — BP 108/68 | HR 88 | Temp 98.6°F | Wt 159.8 lb

## 2018-04-26 DIAGNOSIS — E785 Hyperlipidemia, unspecified: Secondary | ICD-10-CM

## 2018-04-26 DIAGNOSIS — J019 Acute sinusitis, unspecified: Principal | ICD-10-CM

## 2018-04-26 LAB — HEPATIC FUNCTION PANEL
ALANINE AMINOTRANSFERASE: 52 U/L — ABNORMAL HIGH (ref 12–45)
ALBUMIN: 3.7 g/dL (ref 3.4–5.0)
ALKALINE PHOSPHATASE: 103 U/L (ref 45–117)
ASPARTATE AMINOTRANSFERASE: 29 U/L (ref 8–34)
BILIRUBIN DIRECT: 0.1 mg/dl (ref 0.0–0.2)
BILIRUBIN TOTAL: 0.2 mg/dL (ref 0.2–1.0)
INDIRECT BILIRUBIN: 0.1 mg/dL — ABNORMAL LOW (ref 0.2–0.9)
TOTAL PROTEIN: 7 g/dL (ref 6.4–8.2)

## 2018-04-26 LAB — LIPID PANEL
Cholesterol: 244 mg/dL (ref 0–239)
HIGH DENSITY LIPOPROTEIN: 58 mg/dL (ref 40–?)
LOW DENSITY LIPOPROTEIN DIRECT: 159 mg/dL (ref 0–189)
TRIGLYCERIDES: 116 mg/dL (ref 0–150)

## 2018-04-26 MED ORDER — BENZONATATE 100 MG PO CAPS: 100 mg | capsule | Freq: Three times a day (TID) | ORAL | 0 refills | 0 days | Status: AC | PRN

## 2018-04-26 MED ORDER — AMOXICILLIN-POT CLAVULANATE 875-125 MG PO TABS: 1 | tablet | Freq: Two times a day (BID) | ORAL | 0 refills | 0 days | Status: AC

## 2018-04-26 MED ORDER — AMOXICILLIN-POT CLAVULANATE 875-125 MG PO TABS
1.00 | ORAL_TABLET | Freq: Two times a day (BID) | ORAL | 0 refills | Status: AC
Start: 2018-04-26 — End: 2018-05-06

## 2018-04-26 MED ORDER — BENZONATATE 100 MG PO CAPS
100.00 mg | ORAL_CAPSULE | Freq: Three times a day (TID) | ORAL | 0 refills | Status: AC | PRN
Start: 2018-04-26 — End: 2018-05-01

## 2018-04-26 MED FILL — *BENZONATATE 100MG: 5 days supply | Qty: 15 | Fill #0 | Status: CP

## 2018-04-26 MED FILL — AMOX/K CLAV 875MG: 10 days supply | Qty: 20 | Fill #0 | Status: CP

## 2018-04-26 MED FILL — BENZONATATE 100MG: 5 days supply | Qty: 15 | Fill #0 | Status: CP

## 2018-04-26 NOTE — Telephone Encounter (Signed)
Regarding: cough  ----- Message from Ardyth Man sent at 04/26/2018 10:06 AM EST -----  Tina [name] [id number], 62 year old, female    Calls today:  Sick    What are the symptoms cough.  Has appt for flu vaccine calling to cancel.  Did not cancel until speak with nurse.   How long has patient been sick? More than 1 week  What has pt. tried at home [facility] cold and flu   Person calling on behalf of patient: Patient (self)    CALL BACK NUMBER: [phone]  Best time to call back: today  Cell phone:   Other phone:    Patient's language of care: [name]    Patient needs a [name] interpreter.    Patient's PCP: [name], APRN

## 2018-04-26 NOTE — Telephone Encounter (Signed)
Cough for two weeks  Yellow phlem  Not sure if fever, has chills  States headache and back pain as well  Has had this for two weeks-worse this week  No sob  Nard  Scheduled to see provider this AM  Cancelled flu vaccine for today, can determine when in if she should receive it

## 2018-04-26 NOTE — Telephone Encounter (Signed)
Answer Assessment - Initial Assessment Questions  1. ONSET: "When did the cough begin?"       Two weeks ago  2. SEVERITY: "How bad is the cough today?"       mod  3. RESPIRATORY DISTRESS: "Describe your breathing."       nard  4. FEVER: "Do you have a fever?" If so, ask: "What is your temperature, how was it measured, and when did it start?"      notsure  5. SPUTUM: "Describe the color of your sputum" (clear, white, yellow, green)      yellow  6. HEMOPTYSIS: "Are you coughing up any blood?" If so ask: "How much?" (flecks, streaks, tablespoons, etc.)      no  7. CARDIAC HISTORY: "Do you have any history of heart disease?" (e.g., heart attack, congestive heart failure)       See problem list  8. LUNG HISTORY: "Do you have any history of lung disease?"  (e.g., pulmonary embolus, asthma, emphysema)      See problem list  9. PE RISK FACTORS: "Do you have a history of blood clots?" (or: recent major surgery, recent prolonged travel, bedridden )      no  10. OTHER SYMPTOMS: "Do you have any other symptoms?" (e.g., runny nose, wheezing, chest pain)        no  11. PREGNANCY: "Is there any chance you are pregnant?" "When was your last menstrual period?"        no  12. TRAVEL: "Have you traveled out of the country in the last month?" (e.g., travel history, exposures)        no    Protocols used: ADULT COUGH - [place]

## 2018-04-26 NOTE — Progress Notes (Signed)
[facility]    CC: Cough, sinus pain and pressure    HPI: Tina [name] is a 62 year old year old female who presents to clinic with the following concerns    Had a cold 2 weeks ago with nasal congestion, runny nose and scratch throat with mild cough  Symptoms improved and approx 4 days ago began to feel chills, body aches, headache, dry persistant cough, low grade fever and sinus pain and pressure with fatigue  Uses Flaonase and takes Zyrtec daily  Trying DayQuil/Nyquil.  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                No signs of infection on the right. Sent for hearing                test.   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     Chronic left hip pain      Allergies: Review of Patient's Allergies indicates:   Pravachol               Nausea Only, Other (See Comments)    Comment:Malaise   Simvastatin             Palpitations    Comment:"glottis swollen" in past, but says allergy             tested, not allergic to this med per pt 8/19      Medications:     Current Outpatient Medications on File Prior to Visit:  amLODIPine (NORVASC) 5 MG tablet Take 1 tablet by mouth daily Disp: 90 tablet Rfl: 3   gemfibrozil (LOPID) 600 MG tablet Take 1  tablet by mouth 2 (two) times daily before meals Disp: 60 tablet Rfl: 11   Calcium Carbonate-Vitamin D (CALCIUM 600+D) 600-400 MG-UNIT TABS Tablet Take 1 tablet by mouth 2 (two) times daily Disp: 60 tablet Rfl: 11   fluticasone (FLONASE) 50 MCG/ACT nasal spray 2 sprays by Each Nostril route daily Disp: 1 Bottle Rfl: 2   FLUoxetine (PROZAC) 20 MG capsule Take 1 capsule by mouth daily Disp: 30 capsule Rfl: 11   EPINEPHrine 0.3 MG/0.3ML auto-injector Inject 0.3 mg into the muscle once as needed for up to 1 dose Disp: 2 each Rfl: 1   Cholecalciferol (VITAMIN D3) 1000 units TABS Tablet TAKE ONE TABLET BY MOUTH DAILY Disp: 30 tablet Rfl: 11     Current Facility-Administered Medications on File Prior to  Visit:  sodium chloride 0.9 % flush 10-100 mL 10-100 mL Intra-articular Once in imaging Frances Maywood, MD       Physical Exam:  BP 108/68  Pulse 88  Temp 98.6 F (37 C)  Wt 72.5 kg (159 lb 12.8 oz)  LMP 06/08/2008  SpO2 98%  BMI 28.67 kg/m2    General: Well developed, well nourished, ill appearing but not toxic  HENT: PERRLA, EOMI,  Ears: Canals clear, TM visualized and pearly grey  Nasal turbinates pale and edematous  Posterior pharynx with slight erythema, non exudate  Cardaic: RRR, S1 and S2 normal  Resp: Lungs CTA      Assessment/Plan:    (J01.90) Acute non-recurrent sinusitis, unspecified location  (primary encounter diagnosis)  Comment: Exam and history consistent with bacterial sinus infection. Will prescribe Augmentin  875mg  bid x 10 days  Plan: Tessalon perles prn, increase fluid intake, rest    (E78.5) Hyperlipidemia LDL goal < 130  Comment:   Plan: HEPATIC FUNCTION PANEL, LIPID PANEL                Medications were reviewed and discussed including the most common side effects of any newly prescribed medications. Adherence barriers were identified and assessed. I have reviewed the past medical, surgical, social and family history EpicCare as relevant. All interim labs, test results, and consult notes were  reviewed and discussed with the patient. All questions fully answered. She will call me if any problems arise.

## 2018-05-03 ENCOUNTER — Telehealth (HOSPITAL_BASED_OUTPATIENT_CLINIC_OR_DEPARTMENT_OTHER): Payer: Self-pay | Admitting: Internal Medicine

## 2018-05-03 NOTE — Progress Notes (Signed)
Hello Rns    Please call pt    Her lipids have improved, however continue to be elevated    Is currently Rx Gemfibrozil 600 mg BID, please confirm pt is taking this as directed    Noted mildly elevated ALT, I will continue to monitor this    I would also recommend pt see Nutrition    Thank you  Hoyle Sauer

## 2018-05-03 NOTE — Progress Notes (Signed)
Tel call to pt re results w/ [name] interpreter  Reviewed lipids improved but still elevated, total cholesterol 244.   Pt states taking gemfibrozil as rx'ed BID before meals.   Reviewed mildly elevated ALT, will monitor, states no etoh, occasional tylenol that she'll be mindful of  Pt states husband sees nutritionist, aware of how to incorporate healthy eating habits.   Has as sweet tooth and isn't exercising right now - knows these are two areas she needs to work, declining nutrition referral at this time.

## 2018-05-06 MED FILL — FLUOXETINE 20MG CAPS: 30 days supply | Qty: 30 | Fill #5 | Status: CP

## 2018-05-06 MED FILL — AMLODIPINE 5MG: 30 days supply | Qty: 30 | Fill #3 | Status: CP

## 2018-05-06 MED FILL — GEMFIBROZIL 600MG: 30 days supply | Qty: 60 | Fill #4 | Status: CP

## 2018-05-16 ENCOUNTER — Ambulatory Visit (HOSPITAL_BASED_OUTPATIENT_CLINIC_OR_DEPARTMENT_OTHER): Payer: Self-pay | Admitting: Registered Nurse

## 2018-05-16 NOTE — Telephone Encounter (Deleted)
Outreach call returned to patient via interpreter Jenny Reichmann [phone]

## 2018-05-16 NOTE — Telephone Encounter (Signed)
Regarding: stomach pain and acid reflux  ----- Message from Dorris Singh sent at 05/16/2018  2:27 PM EST -----  Tina [name] [id number], 63 year old, female    Calls today:  Sick    What are the symptoms: stomach pain and acid reflux.   How long has patient been sick?   What has pt. tried at home   Person calling on behalf of patient: Patient (self)    CALL BACK NUMBER: [phone]  Best time to call back:   Cell phone:   Other phone:    Patient's language of care: [name]    Patient needs a [name] interpreter.    Patient's PCP: [name], APRN

## 2018-05-16 NOTE — Telephone Encounter (Signed)
Reason for Disposition  . Abdominal pains regularly occur about 1 hour after meals  . Abdominal pain is a chronic symptom (recurrent or ongoing AND present > 4 weeks)    Answer Assessment - Initial Assessment Questions  1. LOCATION: "Where does it hurt?"       Right side below rib     2. RADIATION: "Does the pain shoot anywhere else?" (e.g., chest, back)      Yes radiates to hip     3. ONSET: "When did the pain begin?" (e.g., minutes, hours or days ago)       Ongoing for some time and reflux 2 weeks     4. SUDDEN: "Gradual or sudden onset?"      Gradual     5. PATTERN "Does the pain come and go, or is it constant?"     - If constant: "Is it getting better, staying the same, or worsening?"       (Note: Constant means the pain never goes away completely; most serious pain is constant and it progresses)      - If intermittent: "How long does it last?" "Do you have pain now?"      (Note: Intermittent means the pain goes away completely between bouts)      Constant     6. SEVERITY: "How bad is the pain?"  (e.g., Scale 1-10; mild, moderate, or severe)     - MILD (1-3): doesn't interfere with normal activities, abdomen soft and not tender to touch      - MODERATE (4-7): interferes with normal activities or awakens from sleep, tender to touch      - SEVERE (8-10): excruciating pain, doubled over, unable to do any normal activities        2/10 but at its worst 6/10    7. RECURRENT SYMPTOM: "Have you ever had this type of abdominal pain before?" If so, ask: "When was the last time?" and "What happened that time?"       Yes     8. AGGRAVATING FACTORS: "Does anything seem to cause this pain?" (e.g., foods, stress, alcohol)      Yes     9. CARDIAC SYMPTOMS: "Do you have any of the following symptoms: chest pain, difficulty breathing, sweating, nausea?"      Nausea/vomiting 2 weeks ago, sweating, pain in esophagus    10. OTHER SYMPTOMS: "Do you have any other symptoms?" (e.g., fever, vomiting, diarrhea)        Bloating,  abdominal pain, diarrhea    11. PREGNANCY: "Is there any chance you are pregnant?" "When was your last menstrual period?"        Post menopausal     Patient reports OTC alka Emi [place] helps a little  Patient eats salads, sardines, tomotoes, tuna, beans    Protocols used: ADULT ABDOMINAL PAIN - UPPER-A-AH    Spoke with patient via interpreter Jenny Reichmann [phone]  Patient education and reassurance provided on foods to avoid that worsens indigestion  Reviewed worsening symptoms to contact office or seek ED for evaluation sooner  Patient verbalize understanding and agrees with plan of care.   Appointment booked with [name] NP 05/24/2018    Harrietta Guardian, RN, 05/16/2018

## 2018-05-17 ENCOUNTER — Encounter (HOSPITAL_BASED_OUTPATIENT_CLINIC_OR_DEPARTMENT_OTHER): Payer: Self-pay | Admitting: Otolaryngology

## 2018-05-17 ENCOUNTER — Ambulatory Visit: Payer: No Typology Code available for payment source | Attending: Internal Medicine | Admitting: Otolaryngology

## 2018-05-17 VITALS — BP 111/77 | HR 80

## 2018-05-17 DIAGNOSIS — R55 Syncope and collapse: Secondary | ICD-10-CM | POA: Diagnosis present

## 2018-05-17 DIAGNOSIS — J019 Acute sinusitis, unspecified: Secondary | ICD-10-CM | POA: Diagnosis present

## 2018-05-17 DIAGNOSIS — Z8709 Personal history of other diseases of the respiratory system: Secondary | ICD-10-CM | POA: Diagnosis not present

## 2018-05-17 NOTE — Progress Notes (Signed)
Pt states no change of med list today 05/17/18

## 2018-05-17 NOTE — Progress Notes (Signed)
HPI: 63 year old female Tina [name] complaints of sinus headache with sinusitis 2-3 wks ago. Sx improved after Augmentin. She has a h/o presyncope but not now or recently. Pt has no dyspnea, no dysphagia, no fever today.    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                No signs of infection on the right. Sent for hearing                test.   No date: S/P appendectomy  No date: S/P laparoscopic procedure      Comment:  Exploratory  No date: Wears eyeglasses    Review of Patient's Allergies indicates:   Pravachol               Nausea Only, Other (See Comments)    Comment:Malaise   Simvastatin             Palpitations    Comment:"glottis swollen" in past, but says allergy             tested, not allergic to this med per pt 8/19      Current Outpatient Medications on File Prior to Visit:  amLODIPine (NORVASC) 5 MG tablet Take 1 tablet by mouth daily Disp: 90 tablet Rfl: 3   gemfibrozil (LOPID) 600 MG tablet Take 1 tablet by mouth 2 (two) times daily before meals Disp: 60 tablet Rfl: 11   Calcium Carbonate-Vitamin D (CALCIUM 600+D) 600-400 MG-UNIT TABS Tablet Take 1 tablet by mouth 2 (two) times daily Disp: 60 tablet Rfl: 11   fluticasone (FLONASE) 50 MCG/ACT nasal spray 2 sprays by Each Nostril route daily Disp: 1 Bottle Rfl: 2   FLUoxetine (PROZAC) 20 MG capsule Take 1 capsule by mouth daily Disp: 30 capsule Rfl: 11   Cholecalciferol (VITAMIN D3) 1000 units TABS Tablet TAKE ONE TABLET BY MOUTH DAILY Disp: 30 tablet Rfl: 11   EPINEPHrine 0.3 MG/0.3ML auto-injector Inject 0.3 mg into the muscle once as needed for up to 1 dose Disp: 2  each Rfl: 1     Current Facility-Administered Medications on File Prior to Visit:  sodium chloride 0.9 % flush 10-100 mL 10-100 mL Intra-articular Once in imaging Frances Maywood, MD       Social and family history reviewed either in EPIC or with patient and are not contributory unless specifically noted in HPI.    Review of Systems  Constitutional normal.  CV/Palpitations No.  Resp/SOB No.  Integ/Skin lesions No.  MSk/Pain No.  Psych normal.    Physical Exam   05/17/18  1447   BP: 111/77   Pulse: 80       Constitutional: normal.  Facial lesions: No.  Ears: Right: Obstructing cerumen:No. EAC dry and is open. TM normal.    Left:  Obstructing cerumen:No. EAC dry and is open. TM normal.   Facial Nerve: normal One out of 6 House-Brackmann scale bilaterally.  Nose:  normal.  Oral Cavity: normal.  O/P: Tonsils 1/4 in size bilateral.  Sinus: Pain No.  Neck: Soft. Trachea midline. No palpable masses.  Thyroid: Normal to palpation.  Lymph:  No palpable LN in the neck.  Salivary glands:  Normal size, non-tender.  Communication: Voice normal.    Eyes: PERRLA and EOMI. Nystagmus No  Respiratory drive spontaneous and comfortable.  Cardiac rhythm regular.  Neuro: normal.  Mood/Psych: normal.    A/P  63 year old female with resolved acute sinusitis after abx.  RTC PRN.  Presyncope or lightheadedness has no vestibular etiology.  Recommend to increase dietary salt to improve blood pressure to high normal range to avoid orthostasis.    This record was generated using voice recognition software. I proof-read and corrected any voice recognitions errors found. Please excuse any remaining voice recognition errors which were not detected.

## 2018-05-24 ENCOUNTER — Encounter (HOSPITAL_BASED_OUTPATIENT_CLINIC_OR_DEPARTMENT_OTHER): Payer: Self-pay | Admitting: Nurse Practitioner

## 2018-05-24 ENCOUNTER — Ambulatory Visit: Payer: No Typology Code available for payment source | Attending: Internal Medicine | Admitting: Nurse Practitioner

## 2018-05-24 VITALS — BP 126/70 | HR 60 | Temp 98.5°F | Wt 154.0 lb

## 2018-05-24 DIAGNOSIS — M545 Low back pain, unspecified: Secondary | ICD-10-CM

## 2018-05-24 DIAGNOSIS — Z23 Encounter for immunization: Secondary | ICD-10-CM | POA: Diagnosis present

## 2018-05-24 DIAGNOSIS — R1013 Epigastric pain: Secondary | ICD-10-CM | POA: Diagnosis present

## 2018-05-24 DIAGNOSIS — R1031 Right lower quadrant pain: Secondary | ICD-10-CM | POA: Diagnosis present

## 2018-05-24 MED ORDER — OMEPRAZOLE 20 MG PO CPDR: 20 mg | capsule | Freq: Every day | ORAL | 0 refills | 0 days | Status: AC

## 2018-05-24 MED ORDER — OMEPRAZOLE 20 MG PO CPDR
20.0000 mg | DELAYED_RELEASE_CAPSULE | Freq: Every day | ORAL | 0 refills | Status: DC
Start: 2018-05-24 — End: 2018-05-31

## 2018-05-24 MED ORDER — ACETAMINOPHEN 500 MG PO TABS
500.00 mg | ORAL_TABLET | Freq: Four times a day (QID) | ORAL | 0 refills | Status: AC | PRN
Start: 2018-05-24 — End: 2018-05-31

## 2018-05-24 MED ORDER — LIDOCAINE 5 % EX PTCH
1.00 | MEDICATED_PATCH | CUTANEOUS | 0 refills | Status: AC
Start: 2018-05-24 — End: 2018-06-23

## 2018-05-24 MED ORDER — ACETAMINOPHEN 500 MG PO TABS: 500 mg | tablet | Freq: Four times a day (QID) | ORAL | 0 refills | 0 days | Status: AC | PRN

## 2018-05-24 MED ORDER — LIDOCAINE 5 % EX PTCH: 1 | patch | CUTANEOUS | 0 refills | 0 days | Status: AC

## 2018-05-24 MED FILL — *ACETAMIN 500MG: 7 days supply | Qty: 28 | Fill #0 | Status: CP

## 2018-05-24 MED FILL — LIDOCAINE PATCH 5%: 30 days supply | Qty: 30 | Fill #0 | Status: CP

## 2018-05-24 MED FILL — OMEPRAZOLE 20MG: 14 days supply | Qty: 14 | Fill #0

## 2018-05-24 MED FILL — LIDOCAINE PATCH 5%: 30 days supply | Qty: 30 | Fill #0

## 2018-05-24 MED FILL — *ACETAMIN 500MG: 7 days supply | Qty: 28 | Fill #0

## 2018-05-24 NOTE — Progress Notes (Signed)
Influenza Vaccine Procedure  May 24, 2018    1. Has the patient received the information for the influenza vaccine? Yes    2. Does the patient have any of the following contraindications?  Allergy to eggs? No  Allergic reaction to previous influenza vaccines? No  Any other problems to previous influenza vaccines? No  Paralyzed by Guillain-Barre syndrome?  No  Current moderate or severe illness? No  Allergy to contact lens solution? No    3. The vaccine has been administered in the usual fashion.     Immunization information reviewed. Current VIS reviewed and given to patient/ guardian. Verbal assent obtained from patient/ guardian.  See immunization/Injection module or chart review for date of publication and additional information. Verbal assent obtained from patient/guardian. Comfort measures for possible side effects reviewed.

## 2018-05-24 NOTE — Progress Notes (Signed)
[facility]    CC: epigastric pain, lower back pain, rlq pain    HPI: Tina [name] is a 63 year old year old female who presents to clinic with the following concerns    Multiple complaints, thought was seeing pcp    Complaining of epigastric pain, belching and feeling of something stuck in throat for several weeks  Has been taking a lot of Ibuprofen for lower back pain  Feels burning pain in throat, pain worse after meals  Tries to eat healthy, eats small meals frequently during day.  Occasionally feels nauseous, no vomiting  Denies chest pain, fever, chills, shortness of breath.    Complaining of midline lower back pain for several weeks. Denies radiation of pain into buttock or legs. 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                No signs of infection on the right. Sent for hearing                test.   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     Chronic left  hip pain      Allergies: Review of Patient's Allergies indicates:   Pravachol               Nausea Only, Other (See Comments)    Comment:Malaise   Simvastatin             Palpitations    Comment:"glottis swollen" in past, but says allergy             tested, not allergic to this med per pt 8/19      Medications:     Current Outpatient Medications on File Prior to Visit:  amLODIPine (NORVASC) 5 MG tablet Take 1 tablet by mouth daily Disp: 90 tablet Rfl: 3   gemfibrozil (LOPID) 600 MG tablet Take 1 tablet by mouth 2 (two) times daily before meals Disp: 60 tablet Rfl: 11   Calcium Carbonate-Vitamin D (CALCIUM 600+D) 600-400 MG-UNIT TABS Tablet Take 1 tablet by mouth 2 (two) times daily Disp: 60 tablet Rfl: 11   fluticasone (  FLONASE) 50 MCG/ACT nasal spray 2 sprays by Each Nostril route daily Disp: 1 Bottle Rfl: 2   FLUoxetine (PROZAC) 20 MG capsule Take 1 capsule by mouth daily Disp: 30 capsule Rfl: 11   Cholecalciferol (VITAMIN D3) 1000 units TABS Tablet TAKE ONE TABLET BY MOUTH DAILY Disp: 30 tablet Rfl: 11   EPINEPHrine 0.3 MG/0.3ML auto-injector Inject 0.3 mg into the muscle once as needed for up to 1 dose Disp: 2 each Rfl: 1     Current Facility-Administered Medications on File Prior to Visit:  sodium chloride 0.9 % flush 10-100 mL 10-100 mL Intra-articular Once in imaging Frances Maywood, MD       Physical Exam:  BP 126/70  Pulse 60  Temp 98.5 F (36.9 C) (Oral)  Wt 69.9 kg (154 lb)  LMP 06/08/2008  SpO2 99%  BMI 27.63 kg/m2    General: Well developed, anxious appearing woman sitting comfortably in exam room  Cardiac:RRR, S1 and S2 normal  Lungs: Clear to auscultatio  Abdomen: Epigastric discomfort with palpation. RLQ discomfort with deep palpation. No rebound, guarding, masses. McBurney and Murphy sign negative. No organomegaly noted  MSK: Mild tenderness to lumbar spine with palpation. Straight leg raise negative. Full ROM. No weakness.   Neuro: Cranial nerves II-XII intact    Assessment/Plan:  (R10.13)  Abdominal pain, epigastric  (primary encounter diagnosis)  Comment: Exam and history consistent with GERD, most likely related to increase use of Ibuprofen, discussed stop Ibuprofen, prescribed Omeprazole 20mg  daily for 2 weeks, avoid acidic or spicy foods, small frequent meals,  Elevate head of bed      (M54.5) Acute midline low back pain without sciatica  Comment:History and exam consistent with muscular strain, no red flags on exam to indicate cauda equina syndrome.   Discussed stopping Ibuprofen, prescribed Tylenol, lidocaine patches, warm heat, gentle stretching and PT.  Plan: REFERRAL TO PHYSICAL THERAPY ( INT)            (R10.31) Abdominal pain, right lower quadrant  Comment: Chronic pain, etiology unclear. Last images 2017. Abdominal US ordered. Follow up made with pcp to discuss further  Plan: US ABDOMEN LIMITED            (Z23) Need for prophylactic vaccination and inoculation against influenza  Comment:  Plan: IMMUNIZATION ADMIN SINGLE, IIV4 VACC PRESERV         FREE AGE 47 MONTHS AND OLDER, 0.5ML, IM          Medications were reviewed and discussed including the most common side effects of any newly prescribed medications. Adherence barriers were identified and assessed. I have reviewed the past medical, surgical, social and family history Epic[facility] as relevant. All interim labs, test results, and consult notes were reviewed and discussed with the patient. All questions fully answered. She will call me if any problems arise.

## 2018-05-31 ENCOUNTER — Encounter (HOSPITAL_BASED_OUTPATIENT_CLINIC_OR_DEPARTMENT_OTHER): Payer: Self-pay | Admitting: Internal Medicine

## 2018-05-31 ENCOUNTER — Ambulatory Visit: Payer: No Typology Code available for payment source | Attending: Internal Medicine | Admitting: Internal Medicine

## 2018-05-31 VITALS — BP 108/62 | HR 86 | Temp 98.2°F | Wt 158.0 lb

## 2018-05-31 DIAGNOSIS — R1084 Generalized abdominal pain: Principal | ICD-10-CM

## 2018-05-31 DIAGNOSIS — R197 Diarrhea, unspecified: Secondary | ICD-10-CM

## 2018-05-31 LAB — COMPREHENSIVE METABOLIC PANEL
ALANINE AMINOTRANSFERASE: 24 U/L (ref 12–45)
ALBUMIN: 4 g/dL (ref 3.4–5.0)
ALKALINE PHOSPHATASE: 97 U/L (ref 45–117)
ANION GAP: 7 mmol/L (ref 5–15)
ASPARTATE AMINOTRANSFERASE: 20 U/L (ref 8–34)
BILIRUBIN TOTAL: 0.3 mg/dL (ref 0.2–1.0)
BUN (UREA NITROGEN): 10 mg/dL (ref 7–18)
CALCIUM: 9.2 mg/dL (ref 8.5–10.1)
CARBON DIOXIDE: 31 mmol/L (ref 21–32)
CHLORIDE: 104 mmol/L (ref 98–107)
CREATININE: 0.7 mg/dL (ref 0.4–1.2)
ESTIMATED GLOMERULAR FILT RATE: >60 mL/min (ref >60)
Glucose Random: 87 mg/dL (ref 74–160)
POTASSIUM: 4.7 mmol/L (ref 3.5–5.1)
SODIUM: 141 mmol/L (ref 136–145)
TOTAL PROTEIN: 7.4 g/dL (ref 6.4–8.2)

## 2018-05-31 LAB — CBC, PLATELET & DIFFERENTIAL
ABSOLUTE BASO COUNT: 0 10*3/uL (ref 0.0–0.1)
ABSOLUTE EOSINOPHIL COUNT: 0.3 10*3/uL (ref 0.0–0.8)
ABSOLUTE IMM GRAN COUNT: 0.01 10*3/uL (ref 0.00–0.03)
ABSOLUTE LYMPH COUNT: 2.5 10*3/uL (ref 0.6–5.9)
ABSOLUTE MONO COUNT: 0.5 10*3/uL (ref 0.2–1.4)
ABSOLUTE NEUTROPHIL COUNT: 3.3 10*3/uL (ref 1.6–8.3)
ABSOLUTE NRBC COUNT: 0 10*3/uL (ref 0.0–0.0)
BASOPHIL %: 0.3 % (ref 0.0–1.2)
EOSINOPHIL %: 3.9 % (ref 0.0–7.0)
HEMATOCRIT: 40.8 % (ref 34.1–44.9)
HEMOGLOBIN: 13.2 g/dL (ref 11.2–15.7)
IMMATURE GRANULOCYTE %: 0.2 % (ref 0.0–0.4)
LYMPHOCYTE %: 38.4 % (ref 15.0–54.0)
MEAN CORP HGB CONC: 32.4 g/dL (ref 31.0–37.0)
MEAN CORPUSCULAR HGB: 30.8 pg (ref 26.0–34.0)
MEAN CORPUSCULAR VOL: 95.3 fl (ref 80.0–100.0)
MEAN PLATELET VOLUME: 13 fL — ABNORMAL HIGH (ref 8.7–12.5)
MONOCYTE %: 7.3 % (ref 4.0–13.0)
NEUTROPHIL %: 49.9 % (ref 40.0–75.0)
NRBC %: 0 % (ref 0.0–0.0)
PLATELET COUNT: 248 10*3/uL (ref 150–400)
RBC DISTRIBUTION WIDTH STD DEV: 43.4 fL (ref 35.1–46.3)
RED BLOOD CELL COUNT: 4.28 M/uL (ref 3.90–5.20)
WHITE BLOOD CELL COUNT: 6.6 10*3/uL (ref 4.0–11.0)

## 2018-05-31 MED ORDER — OMEPRAZOLE 20 MG PO CPDR: 20 mg | capsule | Freq: Every day | ORAL | 0 refills | 0 days | Status: AC

## 2018-05-31 MED ORDER — OMEPRAZOLE 20 MG PO CPDR
20.0000 mg | DELAYED_RELEASE_CAPSULE | Freq: Every day | ORAL | 0 refills | Status: DC
Start: 2018-05-31 — End: 2018-08-09

## 2018-05-31 MED FILL — OMEPRAZOLE 20MG: 30 days supply | Qty: 30 | Fill #0 | Status: CP

## 2018-05-31 NOTE — Progress Notes (Signed)
SUBJECTIVE:    [name] is a 63 y.o who presents to clinic with c/c of abdominal pain x2-3 months     Reports having RLQ  abdominal pain, epigastric pain, heartburn and abdominal bloating  Intermittent episodes of n/v and diarrhea for the past 2-3 months .   Hx of chronic RLQ abdominal pain     + mid and left sided lower back pain . Denies radiculopathy, weakness, numbness and tingling   Reports taking Motrin for her back pain    Was seen in clinic 05/24/18 with similar complaint of abdominal pain  Was treated for GERD with Omeprazole 20 mg and advised to stop taking Nsaids. Only took Omeprazole 20 mg for 2 days, has been taking Alka Seltzer   Was also ordered to have a Abdominal ultrasound   Had a normal I FOB 01/03/18     For her lower back pain, was advised to take Tylenol, use Lidocaine patches and was referred to PT.     Denies fevers, chills, body aches, chest pain, SOB,  pelvic pain, vaginal bleeding,  constipation, black or bloody stools.       OBJECTIVE:  General: A+0x3,NAD  BP 108/62 (Site: LA, Position: Sitting, Cuff Size: Reg)  Pulse 86  Temp 98.2 F (36.8 C) (Oral)  Wt 71.7 kg (158 lb)  LMP 06/08/2008  SpO2 98%  BMI 28.35 kg/m2  HEENT: NT/AT PERRLA,  Throat clear,MMM, no exudate noted.  NECK: supple, no nodules, negative lymphadenopathy  CHEST: Clear to auscultation, no wheezes or rales.  CV: RRR, S1, S2 WNL, no murmurs  The abdomen is soft generalized abdominal tenderness on palpation. no guarding, mass, rebound or organomegaly. Bowel sounds are normal. No CVA tenderness or inguinal adenopathy noted.      ASSESSMENT/PLAN  (R10.84) Abdominal pain, generalized  Comment: ddx include gastritis, PUD, cholecystis , cholelithiasis,   Diet modifications dicussed  Avoid ETOH, caffeine, spicy, acidic food and beverages, fatty oily foods, soda, mint, tomato sauce, smoking   Encouraged pt to restart Omeprazole 20 mg daily  Scheduled to have Abdominal u/s   Plan: COLLECTION VENOUS BLOOD  VENIPUNCTURE, CBC,         PLATELET & DIFFERENTIAL, COMPREHENSIVE         METABOLIC PANEL, omeprazole (PRILOSEC) 20 MG         capsule, HELICOBACTER PYLORI STOOL AG           (R19.7) Diarrhea, unspecified type  Comment:   Plan: OVA AND PARASITES, ENTERIC BACTERIAL PANEL PCR            I have spent 25 minutes in face to face time with this patient/patient proxy of which > 50% was in counseling or coordination of care regarding above issues/Dx.

## 2018-06-05 ENCOUNTER — Ambulatory Visit: Payer: No Typology Code available for payment source | Attending: Internal Medicine | Admitting: Lab

## 2018-06-05 DIAGNOSIS — R197 Diarrhea, unspecified: Secondary | ICD-10-CM | POA: Diagnosis present

## 2018-06-05 DIAGNOSIS — R1084 Generalized abdominal pain: Principal | ICD-10-CM

## 2018-06-05 MED FILL — FLUOXETINE 20MG CAPS: 30 days supply | Qty: 30 | Fill #6 | Status: CP

## 2018-06-05 MED FILL — GEMFIBROZIL 600MG: 30 days supply | Qty: 60 | Fill #5 | Status: CP

## 2018-06-05 MED FILL — AMLODIPINE 5MG: 30 days supply | Qty: 30 | Fill #4 | Status: CP

## 2018-06-05 NOTE — Progress Notes (Signed)
Specimen receive

## 2018-06-06 LAB — OVA AND PARASITES
O&P CONCENTRATE: NONE SEEN
O&P CONCENTRATE: NONE SEEN
O&P CONCENTRATE: NONE SEEN
O&P TRICHROME: NONE SEEN
O&P TRICHROME: NONE SEEN
O&P TRICHROME: NONE SEEN

## 2018-06-06 LAB — ENTERIC BACTERIAL PANEL PCR
CAMPYLOBACTER PCR: NOT DETECTED
SALMONELLA PCR: NOT DETECTED
SHIGELLA PCR: NOT DETECTED

## 2018-06-06 LAB — HELICOBACTER PYLORI STOOL AG

## 2018-06-07 ENCOUNTER — Ambulatory Visit
Admission: RE | Admit: 2018-06-07 | Discharge: 2018-06-07 | Disposition: A | Discharge status: Home / Self Care | Payer: No Typology Code available for payment source | Attending: Nurse Practitioner | Admitting: Nurse Practitioner

## 2018-06-07 ENCOUNTER — Other Ambulatory Visit (HOSPITAL_BASED_OUTPATIENT_CLINIC_OR_DEPARTMENT_OTHER): Payer: Self-pay | Admitting: Nurse Practitioner

## 2018-06-07 DIAGNOSIS — R1031 Right lower quadrant pain: Secondary | ICD-10-CM | POA: Diagnosis present

## 2018-06-18 ENCOUNTER — Telehealth (HOSPITAL_BASED_OUTPATIENT_CLINIC_OR_DEPARTMENT_OTHER): Payer: Self-pay | Admitting: Registered Nurse

## 2018-06-18 NOTE — Telephone Encounter (Signed)
-----   Message from [name] sent at 06/18/2018 10:04 AM EST -----  Regarding: results   Contact: [phone]  Tina [name] [id number], 63 year old, female    Calls today:  Test Results  Test Results Request from Patient    What test result(s) is the patient requesting? Lab 1/22 and ultrasound 1/24  Who ordered the test(s): [name]  Person calling on behalf of patient: Patient (self)    [place] NUMBER: [phone]    Patient's language of care: [name]  Patient needs a [name] interpreter.  Patient's PCP: [name], APRN

## 2018-06-18 NOTE — Progress Notes (Signed)
Tel call to pt re results w/ [name] interpreter.     Component      Latest Ref Rng & Units 06/05/2018          12:50 PM   SHIGELLA PCR          SHIGA TOXIN PCR          CAMPYLOBACTER PCR          SALMONELLA PCR          O&P CONCENTRATE       NO OVA AND PARASITES SEEN   O&P TRICHROME       NO OVA AND PARASITES SEEN   HELICOBACTER PYLORI STOOL ANTIGEN            Component      Latest Ref Rng & Units 06/05/2018           1:03 PM   SHIGELLA PCR       No Shigella species EIEC DNA detected.   SHIGA TOXIN PCR       No Shiga Toxin (E.COLI 0157+OTHER) producing . . .   CAMPYLOBACTER PCR       No Campylobacter species (jejuni or coli) DNA detected.   SALMONELLA PCR       No Salmonella species DNA detected.   O&P CONCENTRATE       NO OVA AND PARASITES SEEN   O&P TRICHROME       NO OVA AND PARASITES SEEN   HELICOBACTER PYLORI STOOL ANTIGEN       NEGATIVE: (H. pylori antigens are absent or below the level . . .     Component      Latest Ref Rng & Units 06/05/2018           1:04 PM   SHIGELLA PCR          SHIGA TOXIN PCR          CAMPYLOBACTER PCR          SALMONELLA PCR          O&P CONCENTRATE       NO OVA AND PARASITES SEEN   O&P TRICHROME       NO OVA AND PARASITES SEEN   HELICOBACTER PYLORI STOOL ANTIGEN              Abdominal u/s results:    IMPRESSION:    1.  No cholelithiasis. No biliary obstruction.   2.  Partially visualized pancreas.              Reviewed results w/ pt - everything nml/negative.   Pt relieved results are negative.   Reports still w/ a little abdominal pain, would like to f/u w/ PCP  Scheduled this Friday morning w/ C. [name]

## 2018-06-19 ENCOUNTER — Encounter (HOSPITAL_BASED_OUTPATIENT_CLINIC_OR_DEPARTMENT_OTHER): Payer: Self-pay | Admitting: Internal Medicine

## 2018-06-21 ENCOUNTER — Encounter (HOSPITAL_BASED_OUTPATIENT_CLINIC_OR_DEPARTMENT_OTHER): Payer: Self-pay | Admitting: Internal Medicine

## 2018-07-06 MED FILL — FLUOXETINE 20MG CAPS: 30 days supply | Qty: 30 | Fill #7 | Status: CP

## 2018-07-06 MED FILL — AMLODIPINE 5MG: 30 days supply | Qty: 30 | Fill #5 | Status: CP

## 2018-08-05 MED FILL — FLUOXETINE 20MG CAPS: 90 days supply | Qty: 90 | Fill #8 | Status: CP

## 2018-08-05 MED FILL — AMLODIPINE 5MG: 90 days supply | Qty: 90 | Fill #6 | Status: CP

## 2018-08-05 MED FILL — GEMFIBROZIL 600MG: 90 days supply | Qty: 180 | Fill #6 | Status: CP

## 2018-08-09 ENCOUNTER — Other Ambulatory Visit (HOSPITAL_BASED_OUTPATIENT_CLINIC_OR_DEPARTMENT_OTHER): Payer: Self-pay | Admitting: Internal Medicine

## 2018-08-09 DIAGNOSIS — R1084 Generalized abdominal pain: Secondary | ICD-10-CM

## 2018-08-09 MED ORDER — OMEPRAZOLE 20 MG PO CPDR
20.0000 mg | DELAYED_RELEASE_CAPSULE | Freq: Every day | ORAL | 0 refills | Status: DC
Start: 2018-08-09 — End: 2018-08-09

## 2018-08-09 MED ORDER — OMEPRAZOLE 20 MG PO CPDR: 20 mg | capsule | Freq: Every day | ORAL | 0 refills | 0 days | Status: AC

## 2018-08-09 MED FILL — *OMEPRAZOLE   20MG: 30 days supply | Qty: 30 | Fill #0 | Status: CP

## 2018-08-09 NOTE — Progress Notes (Signed)
Patient stated will pick up 1 month supply of Prilosec  this time but for the next fill date she would like to get 90 days supply       I have pended RX if appropriate  Please advise  Thank you       Faustino Congress,  - [place]  Phone: [phone] Fax: [phone]

## 2018-08-09 NOTE — Progress Notes (Signed)
PER Patient (self), Tina [name] is a 63 year old female has requested a refill of omeprazole (PRILOSEC) 20 MG capsule.      Last Office Visit: 05/31/2018 with Charlean Merl  Last Physical Exam: 10/27/2016    There are no preventive care reminders to display for this patient.    Other Med Adult:  Most Recent BP Reading(s)  05/31/18 : 108/62        Cholesterol (mg/dL)   Date Value   04/26/2018 244 (*H)     LOW DENSITY LIPOPROTEIN DIRECT (mg/dL)   Date Value   04/26/2018 159     HIGH DENSITY LIPOPROTEIN (mg/dL)   Date Value   04/26/2018 58     TRIGLYCERIDES (mg/dL)   Date Value   04/26/2018 116         THYROID SCREEN TSH REFLEX FT4 (uIU/mL)   Date Value   12/20/2017 1.500         TSH (THYROID STIM HORMONE) (uIU/mL)   Date Value   09/23/2010 1.52       HEMOGLOBIN A1C (%)   Date Value   01/07/2018 5.6       No results found for: POCA1C      No results found for: INR    SODIUM (mmol/L)   Date Value   05/31/2018 141       POTASSIUM (mmol/L)   Date Value   05/31/2018 4.7           CREATININE (mg/dL)   Date Value   05/31/2018 0.7       Documented patient preferred pharmacies:    [facility], [place] - [address]  Phone: [phone] Fax: [phone]

## 2018-08-12 MED ORDER — OMEPRAZOLE 20 MG PO CPDR: 20 mg | capsule | Freq: Every day | ORAL | 0 refills | 0 days | Status: AC

## 2018-08-12 MED ORDER — OMEPRAZOLE 20 MG PO CPDR
20.0000 mg | DELAYED_RELEASE_CAPSULE | Freq: Every day | ORAL | 0 refills | Status: DC
Start: 2018-08-12 — End: 2019-02-28

## 2018-09-17 ENCOUNTER — Other Ambulatory Visit: Payer: Self-pay

## 2018-09-17 ENCOUNTER — Ambulatory Visit: Payer: No Typology Code available for payment source | Attending: Internal Medicine | Admitting: Internal Medicine

## 2018-09-17 DIAGNOSIS — R002 Palpitations: Principal | ICD-10-CM

## 2018-09-17 DIAGNOSIS — R42 Dizziness and giddiness: Secondary | ICD-10-CM | POA: Diagnosis present

## 2018-09-17 DIAGNOSIS — R55 Syncope and collapse: Secondary | ICD-10-CM | POA: Diagnosis present

## 2018-09-17 DIAGNOSIS — R000 Tachycardia, unspecified: Secondary | ICD-10-CM

## 2018-09-17 MED ORDER — ASPIRIN 81 MG PO TABS
81.00 mg | ORAL_TABLET | Freq: Every day | ORAL | 11 refills | Status: AC
Start: 2018-09-17 — End: 2019-09-17

## 2018-09-17 MED ORDER — ASPIRIN 81 MG PO TABS: 81 mg | tablet | Freq: Every day | 11 refills | 0 days | Status: AC

## 2018-09-17 NOTE — Televisit Note (Signed)
This patient was identified as meeting criteria for a televisit rather than an in person visit due to public health concerns around COVID-19. A complete assessment and plan is detailed in the note, all of which were conducted remotely using telephone technology.  Patient identity was verbally confirmed by the patient/guardian with 2 identifiers (name, date of birth, and/or address) at the beginning of the visit  Patient/guardian verbally consented to care by televisit as appropriate.   Patient/guardian was located at home during the visit and confirmed that they understood they were encouraged to be in private location due to personal health information being discussed.  Patient/guardian was informed how to access face-to-face care in the event of an emergency.  Provider was located outside the office at a secure location during the visit.  If this is a new patient visit, all available records and medical history were reviewed by the provider.  Visit length was 25 minutes and counseling was done on the diagnoses indicated in the visit.      Diagnosis/differential diagnosis/reason for visit: episode  Rationale for in-person evaluation:episode of chest tightness, tachycardia, presyncopal episode 09/14/2018  Timeframe needed: within 1 week     Screening for possible COVID:    1. In the past 2 weeks, has the patient have fever, cough, shortness of breath, sore throat, runny nose, myalgias, or anosmia? No    If yes to Question 1, patient should be seen in Respiratory Clinic; call [phone].    2. Has the patient ever tested positive for COVID? No If no, proceed to Question 4  3. If yes, has it been < 14 days since the onset of symptoms OR  does the patient have continued fevers or persistent cough/SOB? No    If yes to Question 2: If yes to Question 3, patient should be seen in Respiratory Clinic; call [phone]. If no to Question 3, patient can be seen at [facility]    4. Is the reason for in-person visit a rash?  No    If yes to Question 2: If patient is able to, ask patient to upload pictures to [facility] e-consult. If patient not able to provide pictures, patient should be seen in Respiratory Clinic.    Screening for high-risk exposures:    1. Close contact with a known or confirmed case of COVID: No  2. Lives or works in congregate setting: No  3. Health care worker (including nursing homes and group homes): No    If yes to any, patient will require a mask during the in-person visit.    Guidance for patients:    1. With appropriate exceptions, patients should come to their appointments by themselves and family members should wait in the car.  2. All patients are encouraged to wear masks from home even if a mask is not required.    To request the visit, send a Staff Message to P EC SUPPORT STAFF or P [facility] FRONT DESK and copy the above text into your message.      SUBJECTIVE:    Tina [name] is a 63 y.o who presents for tele visit     5/2/20202: Saturday afternoon- report having sudden onset palpitations and feeling of fainting, lightheadedness.   No dizziness  SOB, chest pain, neck pain, shoulder pain, n/v, abdominal pain    Checked her HR with a monitor with a reading of  129  Felt 2 episodes of chest tightness with reflux  States her hand were shaky and her  body felt weak      Holter monitor in 2012- single PVC, otherwise normal  Normal stress test   Last EKG 12/21/2018 - NSR    Today-- reports feeling well.       Continues to take Omeprazole 20 mg- states his has helped her reflux    Denies fevers, chills, chest pain, SOB, headaches, chills, n/v, neck pain, shoulder pain,abdominal pain, numbness, tingling, change in bowel and urinary patterns       OBJECTIVE:  General: A+0x3,NAD    ASSESSMENT/PLAN  (R00.2) Palpitations  (primary encounter diagnosis)  (R55) Pre-syncope  (R42) Lightheadedness  (R00.0) Tachycardia  Comment: episode of presyncope with tachycardic, palpitations. Concerns of cardiac  arrhythmia  cardiac risk factors, HTN, hyperlipidemia  Is a non smoker  Will refer for face to face appointment- EKG, labs  May need repeat Holter  Advised pt to report continuing or worsening- ED symptoms. Pt agreed to plan.  Plan:     I have spent 25 minutes in face to face time with this patient/patient proxy of which > 50% was in counseling or coordination of care regarding above issues/Dx.

## 2018-09-19 ENCOUNTER — Encounter (HOSPITAL_BASED_OUTPATIENT_CLINIC_OR_DEPARTMENT_OTHER): Payer: Self-pay

## 2018-09-19 NOTE — Progress Notes (Signed)
[facility]. Care Coordination Program. Pt outreached to inform about the program and to provide education about prevention and early detection of breast and cervical cancer. After education pt agreed to book a mammogram for 07/17 at [facility].     The patient verbally consented to the following statement:  "I understand that [employer] is working with the [facility]) to offer education and assistance to me in order to get important health screening tests. I authorize [place] to share my enrollment and health history information and information about the screening tests, results and follow up services I received, to see if it helped me and to collect data so that the [facility] and [place] can obtain more funding to continue to provide services to patients. I also understand that my information is protected by state and federal laws. Only certain people from the [employer] program at the [facility] will see my information. I can also cancel my consent to share information at any time by contacting the clinical program manager, Dewayne Shorter, RN at [phone]"

## 2018-09-20 ENCOUNTER — Encounter (HOSPITAL_BASED_OUTPATIENT_CLINIC_OR_DEPARTMENT_OTHER): Payer: Self-pay | Admitting: Family Medicine

## 2018-09-20 ENCOUNTER — Ambulatory Visit: Payer: No Typology Code available for payment source | Attending: Family Medicine | Admitting: Family Medicine

## 2018-09-20 ENCOUNTER — Other Ambulatory Visit: Payer: Self-pay

## 2018-09-20 VITALS — BP 120/79 | HR 76 | Temp 97.7°F | Resp 14 | Ht 62.59 in | Wt 158.0 lb

## 2018-09-20 DIAGNOSIS — R55 Syncope and collapse: Principal | ICD-10-CM

## 2018-09-20 NOTE — Progress Notes (Signed)
SUBJECTIVE  Tina [name] is a 63 year old female with hx of       On Friday 5/1/had symptoms consistent with reflux when laying down. Took omeprazole.  Felt mildly dizzy, and light headed.  HR was measured and 110     On 5/2  Got up around 10:30am in the morning; took all of medications, went to take a shower, while in the shower felt like she was going to pass out;  heart was racing, hands shaking, ?chest pressure, but not pain, felt like heart was being heard    Called husband who helped her out of the shower and to lay down on the ground.  Symtpoms lasted < 1 minute.   By ~20 minutes later, symptoms had improved laid in bed took blood pressure which was wnl, HR returned to normal (had been 129), temperature, heart rate, blood glucose 105    No further episdoes this week     RF: HTN  Family history significant f cardiac disease    REVIEW OF SYSTEMS  Review of Systems   Constitutional: Negative for chills and fever.   Eyes: Negative.    Respiratory: Negative for cough and shortness of breath.    Cardiovascular: Positive for palpitations. Negative for orthopnea, claudication, leg swelling and PND.   Gastrointestinal: Negative for abdominal pain and vomiting.        Reports hx of heart burn   Musculoskeletal: Negative.    Skin: Negative.    Neurological: Positive for dizziness. Negative for headaches.   Psychiatric/Behavioral: Negative.        OBJECTIVE  PHYSICAL EXAM  BP 120/79 (Site: LA, Position: Sitting, Cuff Size: Reg)   Pulse 76   Temp 97.7 F (36.5 C) (Temporal)   Resp 14   Ht 5' 2.59" (1.59 m)   Wt 71.7 kg (158 lb)   LMP 06/08/2008   SpO2 98%   BMI 28.36 kg/m   Physical Exam   Constitutional: She is oriented to person, place, and time. She appears well-developed and well-nourished.   Eyes: Conjunctivae and EOM are normal.   Neck: Normal range of motion.   Cardiovascular: Normal rate and regular rhythm.   No murmur heard.  Pulmonary/Chest: Effort normal and breath sounds normal. No respiratory  distress.   Musculoskeletal: Normal range of motion.   Neurological: She is alert and oriented to person, place, and time. She displays normal reflexes. No cranial nerve deficit. Coordination normal.   Skin: Skin is warm and dry.         ASSESSMENT/PLAN  EVELLYN TUFF is a 63 year old female here for  (R55) Pre-syncope  While in the shower after taking BP medication  EKG today shows NSR, no evidence of ischemia   As this is one time event and no further instances, delay work up at this time  If symptoms return would recommend blood work and holter monitor  Discussed importance of hydration             Stanford Scotland, MD, 09/20/2018 3:08 PM

## 2018-09-20 NOTE — Progress Notes (Signed)
Pt feels safe at home.  [place], [place], 09/20/2018

## 2018-09-23 LAB — EKG

## 2018-09-26 ENCOUNTER — Telehealth (HOSPITAL_BASED_OUTPATIENT_CLINIC_OR_DEPARTMENT_OTHER): Payer: Self-pay | Admitting: Registered Nurse

## 2018-09-26 DIAGNOSIS — Z20828 Contact with and (suspected) exposure to other viral communicable diseases: Secondary | ICD-10-CM

## 2018-09-26 DIAGNOSIS — Z20822 Contact with and (suspected) exposure to covid-19: Secondary | ICD-10-CM

## 2018-09-26 NOTE — Progress Notes (Signed)
Spoke to pt  Asymptomatic  Booked with [facility] testing tomorrow

## 2018-09-27 ENCOUNTER — Ambulatory Visit
Admission: RE | Admit: 2018-09-27 | Discharge: 2018-09-27 | Disposition: A | Discharge status: Home / Self Care | Payer: No Typology Code available for payment source | Attending: Family Medicine | Admitting: Family Medicine

## 2018-09-27 DIAGNOSIS — Z20828 Contact with and (suspected) exposure to other viral communicable diseases: Secondary | ICD-10-CM | POA: Insufficient documentation

## 2018-09-27 DIAGNOSIS — Z20822 Contact with and (suspected) exposure to covid-19: Secondary | ICD-10-CM

## 2018-09-28 LAB — COVID-19 OUTPATIENT: COVID-19 OUTPATIENT: NEGATIVE

## 2018-10-18 MED FILL — OMEPRAZOLE 20MG: 90 days supply | Qty: 90 | Fill #0 | Status: CP

## 2018-11-04 MED FILL — AMLODIPINE 5MG: 90 days supply | Qty: 90 | Fill #7

## 2018-11-04 MED FILL — AMLODIPINE 5MG: 90 days supply | Qty: 90 | Fill #7 | Status: CP

## 2018-11-26 ENCOUNTER — Other Ambulatory Visit (HOSPITAL_BASED_OUTPATIENT_CLINIC_OR_DEPARTMENT_OTHER): Payer: Self-pay | Admitting: Internal Medicine

## 2018-11-26 DIAGNOSIS — R232 Flushing: Secondary | ICD-10-CM

## 2018-11-26 DIAGNOSIS — E785 Hyperlipidemia, unspecified: Secondary | ICD-10-CM

## 2018-11-26 MED ORDER — FLUOXETINE HCL 20 MG PO CAPS
20.0000 mg | ORAL_CAPSULE | Freq: Every day | ORAL | 3 refills | Status: DC
Start: 2018-11-26 — End: 2019-10-20

## 2018-11-26 MED ORDER — GEMFIBROZIL 600 MG PO TABS
600.0000 mg | ORAL_TABLET | Freq: Two times a day (BID) | ORAL | 3 refills | Status: DC
Start: 2018-11-26 — End: 2019-10-20

## 2018-11-26 MED FILL — FLUOXETINE 20MG CAPS: 90 days supply | Qty: 90 | Fill #0 | Status: CP

## 2018-11-26 MED FILL — GEMFIBROZIL 600MG: 30 days supply | Qty: 60 | Fill #0 | Status: CP

## 2018-11-26 NOTE — Progress Notes (Signed)
PER Patient (self), Tina [name] is a 63 year old female has requested a refill of gemfibrozil (LOPID) 600 MG tablet.  FLUoxetine (PROZAC) 20 MG capsule    Last Office Visit: 09/20/2018 with Liliane Bade  Last Physical Exam: 10/27/2016      Other Med Adult:  Most Recent BP Reading(s)  09/20/18 : 120/79        Cholesterol (mg/dL)   Date Value   04/26/2018 244 (*H)     LOW DENSITY LIPOPROTEIN DIRECT (mg/dL)   Date Value   04/26/2018 159     HIGH DENSITY LIPOPROTEIN (mg/dL)   Date Value   04/26/2018 58     TRIGLYCERIDES (mg/dL)   Date Value   04/26/2018 116         THYROID SCREEN TSH REFLEX FT4 (uIU/mL)   Date Value   12/20/2017 1.500         TSH (THYROID STIM HORMONE) (uIU/mL)   Date Value   09/23/2010 1.52       HEMOGLOBIN A1C (%)   Date Value   01/07/2018 5.6       No results found for: POCA1C      No results found for: INR    SODIUM (mmol/L)   Date Value   05/31/2018 141       POTASSIUM (mmol/L)   Date Value   05/31/2018 4.7           CREATININE (mg/dL)   Date Value   05/31/2018 0.7       Documented patient preferred pharmacies:    [facility], [place] - [address]. [address]  Phone: [phone] Fax: [phone]

## 2018-11-29 ENCOUNTER — Other Ambulatory Visit: Payer: Self-pay

## 2018-11-29 ENCOUNTER — Encounter (HOSPITAL_BASED_OUTPATIENT_CLINIC_OR_DEPARTMENT_OTHER): Payer: Self-pay

## 2018-11-29 ENCOUNTER — Ambulatory Visit
Admission: RE | Admit: 2018-11-29 | Discharge: 2018-11-29 | Disposition: A | Discharge status: Home / Self Care | Payer: No Typology Code available for payment source | Attending: Internal Medicine | Admitting: Internal Medicine

## 2018-11-29 DIAGNOSIS — Z1231 Encounter for screening mammogram for malignant neoplasm of breast: Secondary | ICD-10-CM | POA: Diagnosis not present

## 2018-11-29 DIAGNOSIS — Z1239 Encounter for other screening for malignant neoplasm of breast: Secondary | ICD-10-CM

## 2019-01-16 ENCOUNTER — Other Ambulatory Visit: Payer: Self-pay

## 2019-01-16 MED FILL — GEMFIBROZIL 600MG: 90 days supply | Qty: 180 | Fill #1 | Status: CP

## 2019-01-17 ENCOUNTER — Ambulatory Visit: Payer: No Typology Code available for payment source | Attending: Internal Medicine | Admitting: Internal Medicine

## 2019-01-17 DIAGNOSIS — M25552 Pain in left hip: Secondary | ICD-10-CM

## 2019-01-17 NOTE — Progress Notes (Signed)
Pt of pcp [name].    A week ago, started to feel strong pain L groin. Took tylenol, helped a bit but still can't lift leg up entirely.  Different from prior pain. curr pain is constant, in groin. Worse c lifting leg up. Prior pain was sharp/shooting.  Pain mostly in hip/groin, no rad down thigh/leg.  Rad to L buttock  Not like a shock.  Worst c lifting leg to walk, to sit down, to lie down; has to hold leg to do movements b/c hurts so much.  No recent injury.  Taking tylenol 1000 q8h at most, sometimes less.    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  Also did PT focusing on L hip 2018.    A/P:  1. Hip pain: recurrence of prior hip pain condition - ? OA / tendinopathy. Offered PT, ortho (for poss joint injxn)  - refer ortho, pt prefers Bjorn Loser if possible  - cont tylenol in interim  - pt to call our office if pain worsens or has new sx

## 2019-01-31 ENCOUNTER — Other Ambulatory Visit (HOSPITAL_BASED_OUTPATIENT_CLINIC_OR_DEPARTMENT_OTHER): Payer: Self-pay | Admitting: Internal Medicine

## 2019-01-31 DIAGNOSIS — I10 Essential (primary) hypertension: Secondary | ICD-10-CM

## 2019-01-31 NOTE — Progress Notes (Signed)
PER Pharmacy, Tina [name] is a 63 year old female has requested a refill of Amlodipine.      Last Office Visit: 01-17-2019 with Ledora Bottcher  Last Physical Exam: 10-27-2016    FECAL OCCULT BLOOD AGE 32+ due on 01/04/2019    Other Med Adult:  Most Recent BP Reading(s)  09/20/18 : 120/79        Cholesterol (mg/dL)   Date Value   04/26/2018 244 (*H)     LOW DENSITY LIPOPROTEIN DIRECT (mg/dL)   Date Value   04/26/2018 159     HIGH DENSITY LIPOPROTEIN (mg/dL)   Date Value   04/26/2018 58     TRIGLYCERIDES (mg/dL)   Date Value   04/26/2018 116         THYROID SCREEN TSH REFLEX FT4 (uIU/mL)   Date Value   12/20/2017 1.500         TSH (THYROID STIM HORMONE) (uIU/mL)   Date Value   09/23/2010 1.52       HEMOGLOBIN A1C (%)   Date Value   01/07/2018 5.6       No results found for: POCA1C      No results found for: INR    SODIUM (mmol/L)   Date Value   05/31/2018 141       POTASSIUM (mmol/L)   Date Value   05/31/2018 4.7           CREATININE (mg/dL)   Date Value   05/31/2018 0.7       Documented patient preferred pharmacies:    [facility], [place] - [address]. [address]  Phone: [phone] Fax: [phone]

## 2019-02-03 MED FILL — AMLODIPINE 5MG: 90 days supply | Qty: 90 | Fill #0 | Status: CP

## 2019-02-18 ENCOUNTER — Other Ambulatory Visit: Payer: Self-pay

## 2019-02-18 ENCOUNTER — Ambulatory Visit (HOSPITAL_BASED_OUTPATIENT_CLINIC_OR_DEPARTMENT_OTHER): Payer: No Typology Code available for payment source | Admitting: Orthopaedic Surgery

## 2019-02-18 ENCOUNTER — Ambulatory Visit
Admission: RE | Admit: 2019-02-18 | Discharge: 2019-02-18 | Disposition: A | Discharge status: Home / Self Care | Payer: No Typology Code available for payment source | Source: Ambulatory Visit | Attending: Physician Assistant | Admitting: Physician Assistant

## 2019-02-18 DIAGNOSIS — G8929 Other chronic pain: Secondary | ICD-10-CM | POA: Diagnosis present

## 2019-02-18 DIAGNOSIS — M25552 Pain in left hip: Secondary | ICD-10-CM

## 2019-02-18 NOTE — Patient Instructions (Signed)
Patient Education     Hip Exercises  Ask your health care provider which exercises are safe for you. Do exercises exactly as told by your health care provider and adjust them as directed. It is normal to feel mild stretching, pulling, tightness, or discomfort as you do these exercises. Stop right away if you feel sudden pain or your pain gets worse. Do not begin these exercises until told by your health care provider.  Stretching and range-of-motion exercises  These exercises warm up your muscles and joints and improve the movement and flexibility of your hip. These exercises also help to relieve pain, numbness, and tingling. You may be asked to limit your range of motion if you had a hip replacement. Talk to your health care provider about these restrictions.  Hamstrings, supine    1. Lie on your back (supine position).  2. Loop a belt or towel over the ball of your left / right foot. The ball of your foot is on the walking surface, right under your toes.  3. Straighten your left / right knee and slowly pull on the belt or towel to raise your leg until you feel a gentle stretch behind your knee (hamstring).  ? Do not let your knee bend while you do this.  ? Keep your other leg flat on the floor.  4. Hold this position for __________ seconds.  5. Slowly return your leg to the starting position.  Repeat __________ times. Complete this exercise __________ times a day.  Hip rotation    1. Lie on your back on a firm surface.  2. With your left / right hand, gently pull your left / right knee toward the shoulder that is on the same side of the body. Stop when your knee is pointing toward the ceiling.  3. Hold your left / right ankle with your other hand.  4. Keeping your knee steady, gently pull your left / right ankle toward your other shoulder until you feel a stretch in your buttocks.  ? Keep your hips and shoulders firmly planted while you do this stretch.  5. Hold this position for __________ seconds.  Repeat  __________ times. Complete this exercise __________ times a day.  Seated stretch  This exercise is sometimes called hamstrings and adductors stretch.  1. Sit on the floor with your legs stretched wide. Keep your knees straight during this exercise.  2. Keeping your head and back in a straight line, bend at your waist to reach for your left foot (position A). You should feel a stretch in your right inner thigh (adductors).  3. Hold this position for __________ seconds. Then slowly return to the upright position.  4. Keeping your head and back in a straight line, bend at your waist to reach forward (position B). You should feel a stretch behind both of your thighs and knees (hamstrings).  5. Hold this position for __________ seconds. Then slowly return to the upright position.  6. Keeping your head and back in a straight line, bend at your waist to reach for your right foot (position C). You should feel a stretch in your left inner thigh (adductors).  7. Hold this position for __________ seconds. Then slowly return to the upright position.  Repeat __________ times. Complete this exercise __________ times a day.  Lunge  This exercise stretches the muscles of the hip (hip flexors).  1. Place your left / right knee on the floor and bend your other knee so that is  directly over your ankle. You should be half-kneeling.  2. Keep good posture with your head over your shoulders.  3. Tighten your buttocks to point your tailbone downward. This will prevent your back from arching too much.  4. You should feel a gentle stretch in the front of your left / right thigh and hip. If you do not feel a stretch, slide your other foot forward slightly and then slowly lunge forward with your chest up until your knee once again lines up over your ankle.  ? Make sure your tailbone continues to point downward.  5. Hold this position for __________ seconds.  6. Slowly return to the starting position.  Repeat __________ times. Complete this  exercise __________ times a day.  Strengthening exercises  These exercises build strength and endurance in your hip. Endurance is the ability to use your muscles for a long time, even after they get tired.  Bridge  This exercise strengthens the muscles of your hip (hip extensors).  1. Lie on your back on a firm surface with your knees bent and your feet flat on the floor.  2. Tighten your buttocks muscles and lift your bottom off the floor until the trunk of your body and your hips are level with your thighs.  ? Do not arch your back.  ? You should feel the muscles working in your buttocks and the back of your thighs. If you do not feel these muscles, slide your feet 1-2 inches (2.5-5 cm) farther away from your buttocks.  3. Hold this position for __________ seconds.  4. Slowly lower your hips to the starting position.  5. Let your muscles relax completely between repetitions.  Repeat __________ times. Complete this exercise __________ times a day.  Straight leg raises, side-lying  This exercise strengthens the muscles that move the hip joint away from the center of the body (hip abductors).  1. Lie on your side with your left / right leg in the top position. Lie so your head, shoulder, hip, and knee line up. You may bend your bottom knee slightly to help you balance.  2. Roll your hips slightly forward, so your hips are stacked directly over each other and your left / right knee is facing forward.  3. Leading with your heel, lift your top leg 4-6 inches (10-15 cm). You should feel the muscles in your top hip lifting.  ? Do not let your foot drift forward.  ? Do not let your knee roll toward the ceiling.  4. Hold this position for __________ seconds.  5. Slowly return to the starting position.  6. Let your muscles relax completely between repetitions.  Repeat __________ times. Complete this exercise __________ times a day.  Straight leg raises, side-lying  This exercise strengthen the muscles that move the hip joint  toward the center of the body (hip adductors).  1. Lie on your side with your left / right leg in the bottom position. Lie so your head, shoulder, hip, and knee line up. You may place your upper foot in front to help you balance.  2. Roll your hips slightly forward, so your hips are stacked directly over each other and your left / right knee is facing forward.  3. Tense the muscles in your inner thigh and lift your bottom leg 4-6 inches (10-15 cm).  4. Hold this position for __________ seconds.  5. Slowly return to the starting position.  6. Let your muscles relax completely between repetitions.  Repeat __________  times. Complete this exercise __________ times a day.  Straight leg raises, supine  This exercise strengthens the muscles in the front of your thigh (quadriceps).  1. Lie on your back (supine position) with your left / right leg extended and your other knee bent.  2. Tense the muscles in the front of your left / right thigh. You should see your kneecap slide up or see increased dimpling just above your knee.  3. Keep these muscles tight as you raise your leg 4-6 inches (10-15 cm) off the floor. Do not let your knee bend.  4. Hold this position for __________ seconds.  5. Keep these muscles tense as you lower your leg.  6. Relax the muscles slowly and completely between repetitions.  Repeat __________ times. Complete this exercise __________ times a day.  Hip abductors, standing  This exercise strengthens the muscles that move the leg and hip joint away from the center of the body (hip abductors).  1. Tie one end of a rubber exercise band or tubing to a secure surface, such as a chair, table, or pole.  2. Loop the other end of the band or tubing around your left / right ankle.  3. Keeping your ankle with the band or tubing directly opposite the secured end, step away until there is tension in the tubing or band. Hold on to a chair, table, or pole as needed for balance.  4. Lift your left / right leg out to  your side. While you do this:  ? Keep your back upright.  ? Keep your shoulders over your hips.  ? Keep your toes pointing forward.  ? Make sure to use your hip muscles to slowly lift your leg. Do not tip your body or forcefully lift your leg.  5. Hold this position for __________ seconds.  6. Slowly return to the starting position.  Repeat __________ times. Complete this exercise __________ times a day.  Squats  This exercise strengthens the muscles in the front of your thigh (quadriceps).  1. Stand in a door frame so your feet and knees are in line with the frame. You may place your hands on the frame for balance.  2. Slowly bend your knees and lower your hips like you are going to sit in a chair.  ? Keep your lower legs in a straight-up-and-down position.  ? Do not let your hips go lower than your knees.  ? Do not bend your knees lower than told by your health care provider.  ? If your hip pain increases, do not bend as low.  3. Hold this position for ___________ seconds.  4. Slowly push with your legs to return to standing. Do not use your hands to pull yourself to standing.  Repeat __________ times. Complete this exercise __________ times a day.  This information is not intended to replace advice given to you by your health care provider. Make sure you discuss any questions you have with your health care provider.  Document Released: 05/19/2005 Document Revised: 03/12/2018 Document Reviewed: 03/12/2018  Elsevier Patient Education  2020 [employer].

## 2019-02-18 NOTE — Progress Notes (Signed)
Diagnosis: Left hip osteoarthritis, trochanteric bursitis and gluteal tendinitis    HPI: This is a 63 year old female here for follow-up evaluation of the known above condition.  She has not been seen since 2018 by our Dr. Bjorn Loser.  Patient was referred to [facility] as she had gotten an MRI of her left hip which showed some slight labral pathology and we felt as though she could benefit from an arthroscopic hip operation.  Patient reports that she went to [facility] and they had just recommended physical therapy.  She reports that helps some but her hip pain has continued to persist and worsen over the past couple of months.  She notes the pain is anterior lateral and posterior.  She reports most of her pain is posterior that radiates to the anterior aspect with certain movements such as internal rotation of the hip.  She denies any new trauma to the hip.  She denies any locking or catching.  She denies any distal paresthesias, numbness, or tingling.    Physical Exam:  Gen: Alert and oriented x3 and in no acute distress  MSK: Left hip-no obvious erythema, ecchymosis, swelling, or deformity.  The skin is clean and intact.  Tenderness to palpation over the gluteus maximus and trochanteric bursa.  Pain with internal rotation anteriorly as well as posteriorly.  Negative scour's test.  Negative Faber's test.  Negative straight leg raise.  Pain with hip internal rotation anteriorly.  Limited internal rotation to 25 limited due to pain.  Strength is 5/5.  Neurovascularly intact distally 2+ DP and a 2-second brisk capillary refill.    Radiology: Mild degenerative changes of the left hip no significant changes noted from x-rays performed in 2017.    Assessment and Plan: This is a 63 year old female here for follow-up evaluation of the above condition.  Her exam is not entirely consistent with just osteoarthritic pain although do believe there is a component of it based upon exam findings and imaging.  She has gotten  injections in her hip in the past with significant relief.  She does note that this is similar pain from the pain she was having in 2018.  That being said I think she is a great candidate for a steroid injection.  We will also have her go to physical therapy as that seemed to have been beneficial in the past.  We will have her follow-up with [facility] for her colleagues at [facility] for the injection.  She will follow-up with [place] only as needed in the future.    Patient was seen and examined with Dr.Lipman, who agrees with the assessment and plan. Please see his attending note.    This note was prepared using voice recognition software. Please disregard any transcription errors.    Nursing Communication:  None

## 2019-02-28 ENCOUNTER — Other Ambulatory Visit (HOSPITAL_BASED_OUTPATIENT_CLINIC_OR_DEPARTMENT_OTHER): Payer: Self-pay | Admitting: Internal Medicine

## 2019-02-28 DIAGNOSIS — R1084 Generalized abdominal pain: Secondary | ICD-10-CM

## 2019-02-28 MED ORDER — OMEPRAZOLE 20 MG PO CPDR
20.0000 mg | DELAYED_RELEASE_CAPSULE | Freq: Every day | ORAL | 0 refills | Status: DC
Start: 2019-02-28 — End: 2019-07-09

## 2019-02-28 MED FILL — OMEPRAZOLE 20MG: 90 days supply | Qty: 90 | Fill #0

## 2019-02-28 MED FILL — FLUOXETINE 20MG CAPS: 90 days supply | Qty: 90 | Fill #1

## 2019-02-28 NOTE — Telephone Encounter (Signed)
[facility] INTERNAL MED    Person calling on behalf of patient: Patient (self)    May list multiple medications in this section    Medicine Name: OMEPRAZOLE    Dosage:     Frequency (how many pills, how many times a day):     Number of pills left:     Documented patient preferred pharmacies:   [facility], [place] [place]  Phone: [phone] Fax: [phone]          Patient's language of care: [name]    Patient needs a [name] interpreter.

## 2019-02-28 NOTE — Telephone Encounter (Signed)
PER Patient (self), Tina [name] is a 64 year old female has requested a refill of     -  OMEPRAZOLE       Last Office Visit: 01/17/2019 with Johna Sheriff  Last Physical Exam: 10/27/2016    FECAL OCCULT BLOOD AGE 62+ due on 01/04/2019    Other Med Adult:  Most Recent BP Reading(s)  09/20/18 : 120/79        Cholesterol (mg/dL)   Date Value   04/26/2018 244 (*H)     LOW DENSITY LIPOPROTEIN DIRECT (mg/dL)   Date Value   04/26/2018 159     HIGH DENSITY LIPOPROTEIN (mg/dL)   Date Value   04/26/2018 58     TRIGLYCERIDES (mg/dL)   Date Value   04/26/2018 116         THYROID SCREEN TSH REFLEX FT4 (uIU/mL)   Date Value   12/20/2017 1.500         TSH (THYROID STIM HORMONE) (uIU/mL)   Date Value   09/23/2010 1.52       HEMOGLOBIN A1C (%)   Date Value   01/07/2018 5.6       No results found for: POCA1C      No results found for: INR    SODIUM (mmol/L)   Date Value   05/31/2018 141       POTASSIUM (mmol/L)   Date Value   05/31/2018 4.7           CREATININE (mg/dL)   Date Value   05/31/2018 0.7       Documented patient preferred pharmacies:    [facility], [place] - [address]  Phone: [phone] Fax: [phone]

## 2019-03-18 ENCOUNTER — Other Ambulatory Visit: Payer: Self-pay

## 2019-03-18 ENCOUNTER — Ambulatory Visit: Payer: No Typology Code available for payment source | Attending: Physician Assistant | Admitting: Orthopaedic Surgery

## 2019-03-18 DIAGNOSIS — G5702 Lesion of sciatic nerve, left lower limb: Secondary | ICD-10-CM | POA: Diagnosis not present

## 2019-03-18 DIAGNOSIS — M160 Bilateral primary osteoarthritis of hip: Secondary | ICD-10-CM | POA: Diagnosis present

## 2019-03-18 MED ORDER — MELOXICAM 7.5 MG PO TABS
7.5000 mg | ORAL_TABLET | Freq: Every day | ORAL | 2 refills | Status: DC
Start: 2019-03-18 — End: 2019-04-15

## 2019-03-18 MED FILL — MELOXICAM 7.5MG: 30 days supply | Qty: 30 | Fill #0

## 2019-03-18 NOTE — Progress Notes (Signed)
Orthopedic Office Note    CC: Patient presents with:  Hip Pain: L      ORTHOPEDIC PROBLEM LIST:  1. Left piriformis tendinitis  2. Bilateral hip osteoarthritis    HPI: Tina [name] is a 63 year old female presenting for initial orthopedic evaluation of left hip pain. The pain has been present since 2018. Patient localizes the pain to the left buttock area, just posterior to the greater trochanter in the area of the piriformis tendon and the area of the sciatic notch. No trauma. The pain is worse with pressure. The patient has been seen at [facility] and had an MRI showing a small labral tear, for which they recommended PT. She has seen Dr. Bjorn Loser in the past and had trochanteric bursitis injections. She occasionally has anterior hip pain as well, and he referred her to [place] for consideration for an US guided hip injection.    Denies any numbness or tingling distally.    ROS: Review of systems filled out by patient and scanned into the medical record. Reviewed by me and all other systems are negative except as noted in HPI.    PMH: Past Medical History:  No date: Arthritis  No date: H/o Condyloma      Comment:  2003 (vulvar)  No date: HTN (hypertension)  No date: Hypercholesteremia  No date: Hyperopia  No date: Presbyopia  06/17/2010: Right ear pain      Comment:  Seen by ENT. 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                No signs of infection on the right. Sent for hearing                test.   No date: S/P appendectomy  No date: S/P laparoscopic procedure      Comment:  Exploratory  No date: Wears eyeglasses    Surgical HX: Past Surgical History:  2000: APPENDECTOMY      Comment:  laparoscopy  9/04: ENDOMETRIAL BX W/WO ENDOCERVIX BX W/O DILAT SPX      Comment:  endometrial bx = negative  No date: HYSTEROSCOPY BX ENDOMETRIUM&/POLYPC W/WO D&C      Comment:  3 times in [place]  No date: OB ANTEPARTUM CARE CESAREAN DLVR & POSTPARTUM      Comment:  tubal ligation    SH:   Social History     Socioeconomic History   .  Marital status: Married     Spouse name: [name]   . Number of children: 4   . Years of education: Not on file   . Highest education level: Not on file   Occupational History   . 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 Financial resource strain: Not on file   . Food insecurity     Worry: Not on file     Inability: Not on file   . Transportation needs     Medical: Not on file     Non-medical: Not on file   Tobacco Use   . Smoking status: Former Research scientist (life sciences)   . Smokeless tobacco: Never Used   Substance and Sexual Activity   . Alcohol use: Yes     Comment: Occasionally   . Drug use: No   . Sexual activity: Yes     Partners: Male     Birth control/protection: Surgical   Lifestyle   .  Physical activity     Days per week: Not on file     Minutes per session: Not on file   . Stress: Not on file   Relationships   . Social Product manager on phone: Not on file     Gets together: Not on file     Attends religious service: Not on file     Active member of club or organization: Not on file     Attends meetings of clubs or organizations: Not on file     Relationship status: Not on file   . Intimate partner violence     Fear of current or ex partner: Not on file     Emotionally abused: Not on file     Physically abused: Not on file     Forced sexual activity: Not on file   Other Topics Concern   . Not on file   Social History Narrative    Immigrated from [place] in 2003       Allergies: Review of Patient's Allergies indicates:   Pravachol               Nausea Only, Other (See Comments)    Comment:Malaise   Simvastatin             Palpitations    Comment:"glottis swollen" in past, but says allergy             tested, not allergic to this med per pt 8/19    Current Medications:   Current Outpatient Medications:   .  omeprazole (PRILOSEC) 20 MG capsule, Take 1 capsule by mouth daily, Disp: 90 capsule, Rfl: 0  .  amLODIPine (NORVASC) 5 MG tablet, Take 1 tablet by mouth daily,  Disp: 90 tablet, Rfl: 3  .  gemfibrozil (LOPID) 600 MG tablet, Take 1 tablet by mouth 2 (two) times daily before meals, Disp: 180 tablet, Rfl: 3  .  FLUoxetine (PROZAC) 20 MG capsule, Take 1 capsule by mouth daily, Disp: 90 capsule, Rfl: 3  .  aspirin 81 MG tablet, Take 1 tablet by mouth daily, Disp: 30 tablet, Rfl: 11  .  Cholecalciferol (VITAMIN D3) 1000 units TABS Tablet, TAKE ONE TABLET BY MOUTH DAILY, Disp: 30 tablet, Rfl: 11  .  EPINEPHrine 0.3 MG/0.3ML auto-injector, Inject 0.3 mg into the muscle once as needed for up to 1 dose, Disp: 2 each, Rfl: 1  No current facility-administered medications for this visit.     Facility-Administered Medications Ordered in Other Visits:   .  sodium chloride 0.9 % flush 10-100 mL, 10-100 mL, Intra-articular, Once in imaging, Frances Maywood, MD    IMAGING: x-ray of the bilateral hips and AP pelvis reveals significant pincer deformities bilaterally with preserved joint space     PHYSICAL EXAM:  GENERAL: Alert and oriented, in no acute distress  MOOD: Appropriate.   MUSCULOSKELETAL: Evaluation of the left hip reveals no gross bony deformities. Overlying skin is warm, dry and intact. There is no erythema, edema, ecchymosis or effusion. Patient is TTP over the piriformis tendon and the sciatic notch. Ligaments are stable to varus and valgus stress. ROM: ER 50, IR 30, F 120. Grossly neurovascularly intact distally. Positive SLR on L.     ASSESSMENT/PLAN: 63 year old female with left piriformis tendinitis and bilateral hip osteoarthritis. She was referred for consideration for an intra-articular injection of the left hip under ultrasound. However, she does not have very significant symptoms or signs of osteoarthritis at this  time. She has minimal anterior pain and excellent range of motion with minimal pain anteriorly. She does have moderate TTP at the piriformis and also deeper in the gluteal area, possibly over the sciatic notch. At this time, we recommend injection of the  piriformis tendon insertion. After timeout, using sterile technique, 80 mg of depo-medrol mixed with 4 cc of 1% Xylocaine was injected into the area of the left piriformis insertion. The patient tolerated this well and there were no complications. She can make an appointment with PT at her convenience, she already has a referral. We additionally recommend conservative treatment with NSAIDs at this time. We will start the patient on a course of mobic 7.5 mg to be taken once daily with food. The patient understands to take the medication with a meal at approximately the same time each day. Patient instructed to take the medicine every day for at least a month. The patient is instructed on possible side effects including gastritis and GI bleed, and is instructed to discontinue if they experience these or any other unwanted effects. She can follow up via televisit in one month. A great deal of time was spent discussing the patient's history of a calcification in the proximal quad today, which appears to have resolved since her 2018 MRI. She is reassured that it is not the source of her pain.    Over 40 minutes was spent with the patient during the encounter, with more than 50% of time spent on counseling regarding imaging findings, likely diagnosis, the prognosis, and various treatment options in detail. Risks and benefits of treatment plan discussed. The patient's questions have been answered, and the patient understands agrees with treatment plan.     Imaging was reviewed by myself and Dr. Kennyth Lose during this visit.  Dr. Kennyth Lose saw and examined the patient and formulated the assessment and plan.    This note was prepared using voice recognition software. Please disregard any transcription errors.    Leanord Hawking, PA-C, 03/18/2019      Nursing Communication:    ____ Mena Goes  ___ XOA in splint/cast  ___ XOA out of splint/cast  ___ Cast removal   ___ MRI/CT review  ___ Surgical booking (vitals)  _x__ None

## 2019-03-18 NOTE — Addendum Note (Signed)
Addended by: Leanord Hawking on: 03/18/2019 09:15 AM     Modules accepted: Orders, SmartSet

## 2019-03-18 NOTE — Progress Notes (Signed)
Date of Service: 03/18/2019    ORTHOPEDIC PROBLEM LIST:  Piriformis tendinitis, left hip.    The patient was interviewed and examined and the radiographs reviewed by me.  In summary, she is a 63 year old female with complaints of pain in her buttock and hip area on the left.  Her x-rays by my review show only minimal arthritic changes.  She has tenderness on exam over the piriformis tendon primarily, a little bit on the inferior aspect of the posterior iliac spine region.    The assessment and recommendations were discussed with the patient and [name], PA-C.  Please refer to his notes for further details.  I agree with the assessment and recommendations as noted.    The patient was administered a steroid injection into the piriformis tendon region using 80 mg of Depo-Medrol and 4 mL of Xylocaine using sterile technique (see procedure note).  Will also be placed on Mobic, to return for a followup.    ___________________________  Reviewed and Electronically Signed By: [name] MD  Sig Date: 03/31/2019  Sig Time: 13:23:01  Dictated By: [name] MD  Dict Date: 03/18/2019 Dict Time: 10 16 AM    Dictation Date and Time:03/18/2019 10:16:30  Transcription Date and Time:03/18/2019 10:24:02  eScription Dictation id: [id number] Confirmation # :[id number]      cc: [name] NP

## 2019-03-20 ENCOUNTER — Other Ambulatory Visit: Payer: Self-pay

## 2019-03-27 ENCOUNTER — Other Ambulatory Visit: Payer: Self-pay

## 2019-03-28 ENCOUNTER — Other Ambulatory Visit: Payer: Self-pay

## 2019-03-28 ENCOUNTER — Ambulatory Visit
Payer: No Typology Code available for payment source | Attending: Orthopaedic Surgery | Admitting: Rehabilitative and Restorative Service Providers"

## 2019-03-28 ENCOUNTER — Encounter (HOSPITAL_BASED_OUTPATIENT_CLINIC_OR_DEPARTMENT_OTHER): Payer: Self-pay | Admitting: Rehabilitative and Restorative Service Providers"

## 2019-03-28 DIAGNOSIS — M25552 Pain in left hip: Secondary | ICD-10-CM | POA: Diagnosis present

## 2019-03-28 DIAGNOSIS — G8929 Other chronic pain: Secondary | ICD-10-CM | POA: Insufficient documentation

## 2019-03-28 NOTE — Progress Notes (Signed)
Outpatient Physical Therapy Initial Evaluation  Tina [name]    Patient: Tina [name], Tina [name] [birth year]  ICD-10: [id number], G89.29]    Tina [name] is a 63 year old female who presents today for PT evaluation of left hip pain. This problem began initially 3 years ago; worsening over the past two months So far, patient has tried the following treatments: PT, injection. Since onset of problem, things have improved intially but then worsened recently.    Patient learns best by interpreter/verbal instruction.    Imaging:  XR:   No acute abnormality or significant degenerative changes.   Bilateral acetabular over coverage.    Pain:  Current:0/10  Best:0/10   Denies anything that alleviates pain.   Worst:"unbearable"/10   Denies anything that worsens pain.  Pain is described as at worst a "shock" and is located left glut.      Neuro:  Paraesthesia? Denies      Function  ADL's: Reports occasionally gets shocking pains, has to stop, during all activities  Driving: WNL  Stairs: WNL  Work/School Duties: Works as a International aid/development worker, [employer]: Wakes up with shocking pains, sleeps on her right side, pillow between knees  Recreational Activities: Does not exercise, "knows she needs to do it". Likes to do pilates.     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     Chronic left hip pain      Medications:  meloxicam (MOBIC) 7.5 MG tablet, Take 1 tablet by mouth daily, Disp: 30 tablet, Rfl: 2  omeprazole (PRILOSEC) 20 MG capsule, Take 1 capsule by mouth daily, Disp: 90 capsule, Rfl: 0  amLODIPine (NORVASC) 5 MG tablet, Take 1 tablet by mouth daily, Disp: 90 tablet,  Rfl: 3  gemfibrozil (LOPID) 600 MG tablet, Take 1 tablet by mouth 2 (two) times daily before meals, Disp: 180 tablet, Rfl: 3  FLUoxetine (PROZAC) 20 MG capsule, Take 1 capsule by mouth daily, Disp: 90 capsule, Rfl: 3  aspirin 81 MG tablet, Take 1 tablet by mouth daily, Disp: 30 tablet, Rfl: 11  Cholecalciferol (VITAMIN D3) 1000 units TABS Tablet, TAKE ONE TABLET BY MOUTH DAILY, Disp: 30 tablet, Rfl: 11  EPINEPHrine 0.3 MG/0.3ML auto-injector, Inject 0.3 mg into the muscle once as needed for up to 1 dose, Disp: 2 each, Rfl: 1    sodium chloride 0.9 % flush 10-100 mL, 10-100 mL, Intra-articular, Once in imaging, Frances Maywood, MD        Objective Exam:   *denotes pain with testing    Posture: Sitting in chair for subjective portion of exam, very restless, frequent change of position. In standing,     Balance: good static SLS R/poor static SLS L (valgus collapse)  Gait: Non-antalgic gait     Relevant MMT   RIGHT LEFT   Plantarflexion NT/5 NT/5   Quad 5/5 5/5   Hamstrings 5/5 5/5   Ant tib 5/5 5/5   Glut med  5/5 2+/5  Glut max  5/5 5/5     Relevant  A/PROM  RLE: WNL  LLE: diffusely limited hip    Muscle Length  RLE: WNL  LLE: short ITB, psoas    Palpation:  TTP: severe L glut min, med, TFJ  Tissue Density: above; mod  Alignment: L post innom    Joint Mobility:  Hypermobile lumbar PA glides    Special Tests:  NT        Physical Therapy Plan of Care    PCP:  [name], APRN    Referring Provider:   Roderic Scarce. [place], [place]  [address]  [place], [place] [address]    Diagnosis:   Chronic left hip pain [M25.552, G89.29]    Assessment/Objective Findings:   Tina [name] is a 63 year old year old female who presents to [facility] outpatient physical therapy with complaints of pain in her left hip. Patient reports onset of pain originally three years ago due to no major mechanism. 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 Pt expresses  long term goal of managing symptoms long-term, is motivated to work towards this in PT.    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 These problems limit the patient with the following functional activities: primarily sleeping. The prescribed treatment plan of care is medically necessary to address afore mentioned impairments and return to maximal level of function.    Barriers to treatment including comorbidities of previous PT for the same complaint that was only mildly successful and limited ability to be seen in clinic due to COVID-19 pandemic were identified and taken into considerations of plan of care. The rehabilitation potential for this patient is fair with a stable clinical presentation due to above listed barriers, functional limitations, and clinical exam.    GOALS:  Short Term Functional Goals: 4 weeks.   Patient will demonstrate improved muscle length to WNL throughout BLE's  Patient will demonstrate improved strength to 4/5 throughout BLE's  Patient will be independent with initial HEP    Long Term Goal: 8 weeks.   Patient will demonstrate improved strength to 5/5 throughout and good dynamic motor control so that she will be able to sleep without disturbances due to pain  Patient will be independent with HEP    Treatment Plan:  ** PT Eval - Moderate Complexity (CPT 97162)  ** Stretching/ROM Exercise/Therapeutic Exercise/Home Exercise Program [phone])  ** Neuromuscular Re-education (CPT R2363657)  ** Joint Mobilization/Soft Tissue Mobilization/Manual Traction (CPT W3925647)    Recommend Physical Therapy be continued 1 times per week for 8 weeks.    Patient Regis Bill is aware of attendance policy: Yes  Plan of care discussed with  Patient/Family: Yes  Patient goals reviewed and incorporated in plan of care: Yes  Patient/Family agrees with plan of care: Yes  Patient/Family education: Yes  Does patient feel safe at home: Yes      Thank you for your referral. Please feel free to contact me with any questions or concerns.      Coolidge Breeze, PT, DPT, OCS, CSCS  Lic. [place]: [email or web address]  [email or web address]  Phone: [phone]  Fax:[phone]

## 2019-03-28 NOTE — Progress Notes (Signed)
I certify that the documented Treatment Plan is reasonable and necessary.    03/28/2019  Layla Barter, PA-C

## 2019-04-15 ENCOUNTER — Ambulatory Visit: Payer: No Typology Code available for payment source | Attending: Physician Assistant | Admitting: Orthopaedic Surgery

## 2019-04-15 DIAGNOSIS — M160 Bilateral primary osteoarthritis of hip: Secondary | ICD-10-CM | POA: Diagnosis present

## 2019-04-15 DIAGNOSIS — G8929 Other chronic pain: Secondary | ICD-10-CM | POA: Diagnosis not present

## 2019-04-15 DIAGNOSIS — M25552 Pain in left hip: Secondary | ICD-10-CM | POA: Insufficient documentation

## 2019-04-15 DIAGNOSIS — G5702 Lesion of sciatic nerve, left lower limb: Secondary | ICD-10-CM | POA: Diagnosis present

## 2019-04-15 MED ORDER — MELOXICAM 15 MG PO TABS
15.0000 mg | ORAL_TABLET | Freq: Every day | ORAL | 2 refills | Status: DC
Start: 2019-04-15 — End: 2019-07-14

## 2019-04-15 MED FILL — MELOXICAM 15MG: 30 days supply | Qty: 30 | Fill #0

## 2019-04-15 NOTE — Progress Notes (Signed)
Orthopedic Office Note    CC: Patient presents with:  Hip Pain    ORTHOPEDIC PROBLEM LIST:  1. Left piriformis tendinitis  2. Bilateral hip osteoarthritis    HPI: Tina [name] is a 63 year old female who is evaluated today via televisit for follow up of left hip pain. To review, her pain has been present since 2018. Patient localizes the pain to the left buttock area, just posterior to the greater trochanter in the area of the piriformis tendon and the area of the sciatic notch. No previous injury or trauma. She has seen Dr. Bjorn Loser in the past and had trochanteric bursitis injections. She occasionally has anterior hip pain as well, and he referred her to [place] for consideration for an [place] guided hip injection. Denies any numbness or tingling distally. She was last seen by [place] on 03/18/19, at which time he presentation was believed to be consistent with left piriformis syndrome. He pain was not believed to be attributed to arthritis, thus she was given a piriformis cortisone injection, no intra-articular hip injection. Today she states the injection did help somewhat. She notices the pain most when she is carrying something heavy. 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 She localizes the pain over her buttocks and left groin. She also complains of an intermittent sharp shooting pain in her groin. She has been taking Mobic 7.5mg  with some improvement. States she has been doing PT as advised, but chart review shows only one eval at this time.     [name] speaking telephone interpreter was utilized to conduct this televisit.     ROS: Review of systems filled out by patient and scanned into the medical record. Reviewed by me and all other systems are negative except as noted in HPI.    IMAGING: x-ray of the bilateral hips and AP pelvis reveals significant pincer deformities bilaterally with preserved joint space. Imaging was reviewed by myself an Dr. Kennyth Lose at today's visit.     PHYSICAL  EXAM:  Deferred, as this was a televisit.     ASSESSMENT/PLAN: 63 year old female with left piriformis tendinitis and mild bilateral hip osteoarthritis. She was initially referred for consideration for an intra-articular injection of the left hip under ultrasound, however at last visit her presentation was more consistent with piriformis syndrome and thus she was given an injection at the insertion of the piriformis syndrome. This seems to have offered her some but incomplete pain relief. Recommend she continue with the Mobic and I will increase her dose to 15mg  daily to be taken with food. She should also continue with regular PT. We will see her back for follow up in person in 2 months, at which time she may require repeat cortisone injection. She may contact the office in the interim with any concerns or worsening symptoms.     Over 25 minutes was spent with the patient during the encounter, with more than 50% of time spent on counseling regarding imaging findings, likely diagnosis, the prognosis, and various treatment options in detail. Risks and benefits of treatment plan discussed. The patient's questions have been answered, and the patient understands agrees with treatment plan.     This televisit was conducted with Dr. Kennyth Lose who interviewed the patient and formulated the treatment plan.     Denice Paradise, PA-C, 04/17/2019     Nursing Communication:  None

## 2019-04-16 NOTE — Progress Notes (Signed)
I Dr. Kennyth Lose interviewed the patient and reviewed her radiographs with Rudean Hitt PA-C.  Please refer to her notes for further details.  I agree with the assessment and recommendations as noted.  Previously had injection for piriformis tendinitis.  Also taking Mobic 7 and half milligrams a day.  Symptoms have not completely resolved.    Plan to increase Mobic to 15 mg a day.  We will follow-up in 2 months with a face-to-face visit.  May require another injection.    [name] MD [phone]

## 2019-04-18 ENCOUNTER — Ambulatory Visit (HOSPITAL_BASED_OUTPATIENT_CLINIC_OR_DEPARTMENT_OTHER): Payer: Self-pay | Admitting: Rehabilitative and Restorative Service Providers"

## 2019-04-18 ENCOUNTER — Other Ambulatory Visit: Payer: Self-pay

## 2019-05-01 ENCOUNTER — Ambulatory Visit (HOSPITAL_BASED_OUTPATIENT_CLINIC_OR_DEPARTMENT_OTHER): Payer: No Typology Code available for payment source | Admitting: Rehabilitative and Restorative Service Providers"

## 2019-05-05 MED FILL — GEMFIBROZIL 600MG: 90 days supply | Qty: 180 | Fill #2 | Status: CP

## 2019-05-05 MED FILL — AMLODIPINE 5MG: 90 days supply | Qty: 90 | Fill #1 | Status: CP

## 2019-07-09 ENCOUNTER — Other Ambulatory Visit (HOSPITAL_BASED_OUTPATIENT_CLINIC_OR_DEPARTMENT_OTHER): Payer: Self-pay | Admitting: Internal Medicine

## 2019-07-09 DIAGNOSIS — R1084 Generalized abdominal pain: Secondary | ICD-10-CM

## 2019-07-09 MED FILL — OMEPRAZOLE 20MG: 90 days supply | Qty: 90 | Fill #0

## 2019-07-09 MED FILL — FLUOXETINE 20MG CAPS: 90 days supply | Qty: 90 | Fill #2

## 2019-07-09 NOTE — Telephone Encounter (Signed)
PER Pharmacy, Tina [name] is a 64 year old female has requested a refill of : omeprazole (PRILOSEC) 20 MG capsule      Last tel. Visit: 04/15/2019 with Doppelt,S  Last Physical Exam: 10/27/2016    FECAL OCCULT BLOOD AGE 28+ due on 01/04/2019    Other Med Adult:  Most Recent BP Reading(s)  09/20/18 : 120/79        Cholesterol (mg/dL)   Date Value   04/26/2018 244 (*H)     LOW DENSITY LIPOPROTEIN DIRECT (mg/dL)   Date Value   04/26/2018 159     HIGH DENSITY LIPOPROTEIN (mg/dL)   Date Value   04/26/2018 58     TRIGLYCERIDES (mg/dL)   Date Value   04/26/2018 116         THYROID SCREEN TSH REFLEX FT4 (uIU/mL)   Date Value   12/20/2017 1.500         TSH (THYROID STIM HORMONE) (uIU/mL)   Date Value   09/23/2010 1.52       HEMOGLOBIN A1C (%)   Date Value   01/07/2018 5.6       No results found for: POCA1C      No results found for: INR    SODIUM (mmol/L)   Date Value   05/31/2018 141       POTASSIUM (mmol/L)   Date Value   05/31/2018 4.7           CREATININE (mg/dL)   Date Value   05/31/2018 0.7       Documented patient preferred pharmacies:    [facility], [place] - [address]  Phone: [phone] Fax: [phone]

## 2019-07-31 MED FILL — AMLODIPINE 5MG: 90 days supply | Qty: 90 | Fill #2

## 2019-08-11 ENCOUNTER — Other Ambulatory Visit: Payer: Self-pay

## 2019-08-11 ENCOUNTER — Ambulatory Visit: Payer: 344 | Attending: Internal Medicine

## 2019-08-11 DIAGNOSIS — Z23 Encounter for immunization: Secondary | ICD-10-CM | POA: Diagnosis not present

## 2019-09-01 ENCOUNTER — Ambulatory Visit: Payer: 344 | Attending: Internal Medicine

## 2019-09-01 ENCOUNTER — Other Ambulatory Visit: Payer: Self-pay

## 2019-09-01 DIAGNOSIS — Z23 Encounter for immunization: Secondary | ICD-10-CM | POA: Diagnosis present

## 2019-09-18 ENCOUNTER — Encounter (HOSPITAL_BASED_OUTPATIENT_CLINIC_OR_DEPARTMENT_OTHER): Payer: Self-pay

## 2019-09-18 ENCOUNTER — Ambulatory Visit (HOSPITAL_BASED_OUTPATIENT_CLINIC_OR_DEPARTMENT_OTHER): Payer: Self-pay

## 2019-09-18 ENCOUNTER — Emergency Department
Admission: AD | Admit: 2019-09-18 | Discharge: 2019-09-18 | Disposition: A | Discharge status: Home / Self Care | Payer: No Typology Code available for payment source | Source: Intra-hospital | Attending: Emergency Medicine | Admitting: Emergency Medicine

## 2019-09-18 DIAGNOSIS — K648 Other hemorrhoids: Secondary | ICD-10-CM | POA: Diagnosis present

## 2019-09-18 DIAGNOSIS — K6289 Other specified diseases of anus and rectum: Secondary | ICD-10-CM

## 2019-09-18 MED ORDER — ACETAMINOPHEN 325 MG PO TABS
650.00 mg | ORAL_TABLET | Freq: Four times a day (QID) | ORAL | 0 refills | Status: AC | PRN
Start: 2019-09-18 — End: 2019-10-18

## 2019-09-18 MED ORDER — POLYETHYLENE GLYCOL 3350 17 G PO PACK
17.00 g | PACK | Freq: Every day | ORAL | 0 refills | Status: AC
Start: 2019-09-18 — End: 2019-09-25

## 2019-09-18 MED ORDER — HYDROCORTISONE ACETATE 25 MG PR SUPP
25.0000 mg | Freq: Two times a day (BID) | RECTAL | 0 refills | Status: DC
Start: 2019-09-18 — End: 2019-10-20

## 2019-09-18 NOTE — UC Provider Notes (Signed)
The patient was seen primarily by me. Urgent care nursing record was reviewed. Prior records as available electronically through the Epic record were reviewed.  Care language: [name] Interpreter used.         HPI:    This is a 64 year old female patient complaining of rectal pain and mass for 1 week, worse over the past few days.  Patient reports pain with defecation, has occasionally felt a mass.  Denies any bleeding, fevers, vomiting, constipation, hard stools.  Patient with no similar symptoms in the past.  Patient does report chronic right lower quadrant pain, intermittent, ongoing for the past 2 weeks.  Patient denies any dysuria, hematuria, urinary frequency.       ROS: Pertinent positives were reviewed as per the HPI above. All other systems were reviewed and are negative.      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                No signs of infection on the right. Sent for hearing                test.   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     Current Outpatient Medications   Medication Sig   . polyethylene glycol (MIRALAX) 17 g packet Take 1 packet by mouth daily  for 7 days   . hydrocortisone (ANUSOL-HC) 25 MG suppository Place 1 suppository rectally 2 (two) times daily  for 7 days   . acetaminophen (TYLENOL) 325 MG tablet Take 2 tablets by mouth every 6 (six) hours as needed   . omeprazole (PRILOSEC) 20 MG capsule TAKE 1 CAPSULE BY MOUTH DAILY   . meloxicam (MOBIC) 15 MG tablet Take 1 tablet by mouth daily   . amLODIPine (NORVASC) 5 MG tablet Take 1 tablet by mouth daily   . gemfibrozil (LOPID) 600 MG  tablet Take 1 tablet by mouth 2 (two) times daily before meals   . FLUoxetine (PROZAC) 20 MG capsule Take 1 capsule by mouth daily   . Cholecalciferol (VITAMIN D3) 1000 units TABS Tablet TAKE ONE TABLET BY MOUTH DAILY   . EPINEPHrine 0.3 MG/0.3ML auto-injector Inject 0.3 mg into the muscle once as needed for up to 1 dose     Facility-Administered Medications Ordered in Other Encounters   Medication   . sodium chloride 0.9 % flush 10-100 mL         Social History: Social History    Tobacco Use      Smoking status: Former Smoker      Smokeless tobacco: Never Used    Alcohol use: Yes      Comment: Occasionally        Allergies:  Review of Patient's Allergies indicates:   Pravachol               Nausea Only, Other (See Comments)    Comment:Malaise   Simvastatin             Palpitations    Comment:"glottis swollen" in past, but says allergy             tested, not allergic to this med per pt 8/19      Physical Exam:  ED Triage Vitals [09/18/19 1836]   ED Triage Vitals Brief Group      Temp 97.4 F      Pulse 75      Resp 20      BP 157/76      SpO2 98 %      Pain Score        GENERAL: No acute distress.   SKIN:   Warm & Dry, no rash.  HEAD: Atraumatic. PERRL. EOMI.  Oropharynx: clear.  NECK: No midline tenderness.  No LAN.   LUNGS:  Clear to auscultation bilaterally. No wheezes, rales, rhonchi.   HEART:  RRR.  No murmurs, rubs, or gallops.   ABDOMEN:  Soft, minimal RLQ.  No guarding or rebound tenderness.   RECTAL: chaperone by RN Diedre, soft mass at 12 oclock, internal, ttp, no blood  MUSCULOSKELETAL:  No obvious deformities.    NEUROLOGIC: Alert and oriented.  Moves all extremities well.  PSYCHIATRIC:  Appropriate for age, time of day, and situation        Urgent Care Course and Medical Decision-making:    The patient is a  64 year old female with past medical history as above presents with pain with defecation, mass palpable on examination, appears consistent with internal hemorrhoid.  Patient with chronic right lower quadrant pain, does not appear acutely different, very low suspicion for appendicitis or other dangerous abdominal pathology at this time.  Plan for outpatient follow-up, steroid suppository, dietary modification, stool softener.  Encouraged to seek out ED evaluation if abdominal pain is worse or different.       Additional verbal discharge instructions were provided including patients diagnosis and follow up plan, as well as reasons to return to the Urgent Care or Emergency Department, which were discussed in detail.  Patient is agreeable with this management.         Condition: Improved and Stable    Disposition:  Discharged to home    Diagnosis/Diagnoses:  Internal hemorrhoid        Tina [name]  Attending Physician  [facility]  [phone]

## 2019-09-18 NOTE — Telephone Encounter (Signed)
Regarding: triage  ----- Message from Brion Aliment sent at 09/18/2019  9:14 AM EDT -----  Tina [name] [id number], 64 year old, female    Calls today:  Sick    What are the symptoms : a week ago she went to evacuate and a lump came out, is still out and she is having pain when cleaning. Wants to be seen  How long has patient been sick?   What has pt. tried at home   Person calling on behalf of patient: Patient (self)    CALL BACK NUMBER: [phone]  Best time to call back:   Cell phone:   Other phone:    Patient's language of care: [name]    Patient needs a [name] interpreter.    Patient's PCP: [name], APRN

## 2019-09-18 NOTE — UC Triage Notes (Signed)
Pt arrives for eval of rectal pain x1 week. States worsened today with normal BM, noticed something "popped out". Pain worse with sitting. Also c/o months of RLQ pain.

## 2019-09-18 NOTE — Telephone Encounter (Signed)
Call to pt through interpretor. C/o rectal pain and"lump" at anus after moving bowels 1 week ago. Size of a grape and purple in color. No blood noted. Denies constipation, stools soft.  Advised UC for eval. Agrees with plan.

## 2019-09-18 NOTE — Narrator Note (Signed)
Patient Disposition  Patient education for diagnosis, medications, activity, diet and follow-up.  Patient left UC 7:55 PM.  Patient rep received written instructions.    Interpreter to provide instructions: No    Patient belongings with patient: YES    Have all existing LDAs been addressed? Yes    Have all IV infusions been stopped? N/A    Destination: Home

## 2019-09-18 NOTE — Narrator Note (Addendum)
Pt cleared for discharge. Verbalized understanding of discharge instructions. Ambulatory to exit without difficulty.

## 2019-09-19 MED FILL — ACETAMIN 325MG: 3 days supply | Qty: 30 | Fill #0 | Status: CP

## 2019-09-19 MED FILL — ANUCORT-HC  SUP 25MG: 7 days supply | Qty: 14 | Fill #0 | Status: CP

## 2019-09-19 MED FILL — POLYETH GLYC POW 3350 NF: 28 days supply | Qty: 28 | Fill #0 | Status: CP

## 2019-09-22 ENCOUNTER — Ambulatory Visit: Payer: No Typology Code available for payment source | Attending: Internal Medicine | Admitting: Internal Medicine

## 2019-09-22 DIAGNOSIS — G8929 Other chronic pain: Secondary | ICD-10-CM | POA: Insufficient documentation

## 2019-09-22 DIAGNOSIS — M25552 Pain in left hip: Secondary | ICD-10-CM | POA: Diagnosis not present

## 2019-09-22 DIAGNOSIS — N950 Postmenopausal bleeding: Secondary | ICD-10-CM | POA: Insufficient documentation

## 2019-09-22 DIAGNOSIS — R1031 Right lower quadrant pain: Secondary | ICD-10-CM | POA: Diagnosis present

## 2019-09-22 DIAGNOSIS — K648 Other hemorrhoids: Secondary | ICD-10-CM | POA: Diagnosis present

## 2019-09-22 NOTE — Progress Notes (Signed)
SUBJECTIVE:    Tina [name] is a 64 y.o who presents for tele visit     Was seen in UC, 5/6 with c/c of rectal pain and mass x 1 week    Was found to have an internal hemorrhoids     Reports using rectal suppositories as Rx with effect. State the  swelling and pain of her rectum has improved, however continues to have mild rectal pain .    Hx of chronic RLQ abdominal pain/right sided pelvic pain     Had an Abdominal U/S 06/07/2018 with impression of     1. No cholelithiasis. No biliary obstruction.   2. Partially visualized pancreas.     Denies rectal bleeding, constipation, diarrhea, rectal bleeding, black or bloody stools     2 months ago, had episode of post menopausal bleeding - scant bleeding  Hx of postmenopausal bleeding in 2018  S/p pelvic u/s 01/05/17 with impression of     The endometrium is thin and uniform. Neither ovary could be    visualized.      Seen by OB/GYN and advised if persistent bleeding may consider biopsy 2-3 months v hysteroscopy    Would like to have another referral to orthopedics - hx of chronic left hip pain       OBJECTIVE:  General: A+0x3, speaking in full sentences       ASSESSMENT/PLAN  (R10.31) Abdominal pain, right lower quadrant  Comment: hx of chronic RLQ abdominal pain vs pelvic pain  Pt desires to have an CT scan  Also concerns ovaries not fully visualized with pelvic u/s 2018  Will order pelvic/Abdominal CT   Plan: CT ABDOMEN & PELVIS W IV CONTRAST, CANCELED: CT        ABDOMEN & PELVIS W WO IV CONTRAST           (N95.0) Post-menopausal bleeding  Comment: hx of post menopausal bleeding with recurrent postmenopausal bleeding 2 months ago  Will refer back to OB/GYN for EMB  Plan: REFERRAL TO GYNECOLOGY (INT), CT ABDOMEN &    PELVIS W IV CONTRAS    (M25.552,  G89.29) Chronic left hip pain  Comment:   Plan: REFERRAL TO ORTHOPEDICS ( INT)            (K64.8) Internal hemorrhoids  Comment: pt desires to see GI for f/u  Plan: REFERRAL TO GASTROENTEROLOGY ( INT)

## 2019-09-23 ENCOUNTER — Other Ambulatory Visit: Payer: Self-pay

## 2019-09-25 ENCOUNTER — Encounter (HOSPITAL_BASED_OUTPATIENT_CLINIC_OR_DEPARTMENT_OTHER): Payer: Self-pay

## 2019-10-14 ENCOUNTER — Other Ambulatory Visit: Payer: Self-pay

## 2019-10-14 ENCOUNTER — Encounter (HOSPITAL_BASED_OUTPATIENT_CLINIC_OR_DEPARTMENT_OTHER): Payer: Self-pay | Admitting: Internal Medicine

## 2019-10-14 ENCOUNTER — Ambulatory Visit
Admission: RE | Admit: 2019-10-14 | Discharge: 2019-10-14 | Disposition: A | Discharge status: Home / Self Care | Payer: No Typology Code available for payment source | Attending: Internal Medicine | Admitting: Internal Medicine

## 2019-10-14 DIAGNOSIS — N950 Postmenopausal bleeding: Secondary | ICD-10-CM | POA: Diagnosis not present

## 2019-10-14 DIAGNOSIS — R1031 Right lower quadrant pain: Secondary | ICD-10-CM | POA: Insufficient documentation

## 2019-10-14 DIAGNOSIS — K5730 Diverticulosis of large intestine without perforation or abscess without bleeding: Secondary | ICD-10-CM | POA: Diagnosis present

## 2019-10-14 MED ORDER — IOHEXOL 9 MG/ML PO SOLN
500.00 mL | ORAL | Status: AC | PRN
Start: 2019-10-14 — End: 2019-10-14
  Administered 2019-10-14 (×2): 500 mL via ORAL

## 2019-10-14 MED ORDER — NORMAL SALINE FLUSH 0.9 % IV SOLN
50.00 mL | Freq: Once | INTRAVENOUS | Status: AC
Start: 2019-10-14 — End: 2019-10-14
  Administered 2019-10-14: 50 mL via INTRAVENOUS

## 2019-10-14 MED ORDER — IOHEXOL 350 MG/ML IV SOLN
85.00 mL | Freq: Once | INTRAVENOUS | Status: AC
Start: 2019-10-14 — End: 2019-10-14
  Administered 2019-10-14: 85 mL via INTRAVENOUS

## 2019-10-15 ENCOUNTER — Ambulatory Visit (HOSPITAL_BASED_OUTPATIENT_CLINIC_OR_DEPARTMENT_OTHER): Payer: No Typology Code available for payment source | Admitting: Physician Assistant

## 2019-10-15 ENCOUNTER — Ambulatory Visit
Admission: RE | Admit: 2019-10-15 | Discharge: 2019-10-15 | Disposition: A | Discharge status: Home / Self Care | Payer: No Typology Code available for payment source | Attending: Physician Assistant | Admitting: Physician Assistant

## 2019-10-15 ENCOUNTER — Encounter (HOSPITAL_BASED_OUTPATIENT_CLINIC_OR_DEPARTMENT_OTHER): Payer: Self-pay | Admitting: Physician Assistant

## 2019-10-15 DIAGNOSIS — M5416 Radiculopathy, lumbar region: Secondary | ICD-10-CM | POA: Diagnosis present

## 2019-10-15 DIAGNOSIS — G8929 Other chronic pain: Secondary | ICD-10-CM | POA: Diagnosis not present

## 2019-10-15 DIAGNOSIS — M5442 Lumbago with sciatica, left side: Secondary | ICD-10-CM | POA: Insufficient documentation

## 2019-10-15 DIAGNOSIS — M7062 Trochanteric bursitis, left hip: Secondary | ICD-10-CM

## 2019-10-15 MED ORDER — METHYLPREDNISOLONE ACETATE 80 MG/ML IJ SUSP
80.00 mg | Freq: Once | INTRAMUSCULAR | 0 refills | Status: AC
Start: 2019-10-15 — End: 2019-10-15

## 2019-10-15 NOTE — Progress Notes (Signed)
Orthopedic Office Note    CC: Patient presents with:  Follow Up: Left hip    ORTHOPEDIC PROBLEM LIST:  1. Left piriformis tendinitis  2. Bilateral hip osteoarthritis    HPI: Tina [name] is a 64 year old female who is evaluated today for follow up of left hip pain.     To review, her pain has been present since 2018. Patient localizes the pain to the left buttock area, just posterior to the greater trochanter in the area of the piriformis tendon and the area of the sciatic notch. No previous injury or trauma. She has seen Dr. Bjorn Loser in the past and had trochanteric bursitis injections. She occasionally has anterior hip pain as well, and he referred her to [place] for consideration for an [place] guided hip injection. Denies any numbness or tingling distally. Previous presentation was believed to be consistent with left piriformis syndrome. He pain was not believed to be attributed to arthritis, thus she was given a piriformis cortisone injection, not an intra-articular hip injection. She states the injection did help somewhat. She notices the pain most when she is carrying something heavy. 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 She localizes the pain over her buttocks and left groin. She also complains of an intermittent sharp shooting pain in her groin. She took Mobic 15 mg with some improvement. She did a PT visit in the past, but apparently only one, then tried to do exercises on her own at home.    [name] speaking telephone interpreter was utilized to conduct this televisit.     ROS: Review of systems filled out by patient and scanned into the medical record. Reviewed by me and all other systems are negative except as noted in HPI.    IMAGING: x-ray of the bilateral hips and AP pelvis reveals significant pincer deformities bilaterally with preserved joint space. Imaging was reviewed by myself an Dr. Kennyth Lose at today's visit.     PHYSICAL EXAM:  GENERAL: Alert and oriented, in no  acute distress.    MOOD: Appropriate.   HENT: NCAT, sclera anicteric, EOMI.  RESP: Breathing unlabored.  Coordination and balance: grossly normal   MUSCULOSKELETAL: Evaluation of the left hip and low back reveals no gross bony deformities. Overlying skin is warm, dry and intact. There is no erythema, edema, ecchymosis or effusion. Patient has moderate TTP over the left trochanteric bursa. NTTP over piriformis tendon insertion. NTTP along spinous processes. Able to flatten lordosis on forward flexion. Leg is neurovascularly intact distally. SLT in all fields.     ASSESSMENT/PLAN: 64 year old female with left trochanteric bursitis and mild bilateral hip osteoarthritis with low back pain. She was initially referred for consideration for an intra-articular injection of the left hip under ultrasound, however at last visit her presentation was more consistent with piriformis syndrome and thus she was given an injection at the insertion of the piriformis syndrome. This offered her some but incomplete pain relief. 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 Recommend injection to the bursa, which she appreciates. After timeout, using sterile technique, 80 mg of depo-medrol mixed with 4 cc of 1% Xylocaine was injected into the left trochanteric bursa. The patient tolerated this well and there were no complications. For her low back pain, we recommend PT. She was doing exercises on her own at home. She is educated that exercising without  the guidance of PT is not that same, and that the PT is much more effective if she combines visits with them with doing the specific exercises she learns with PT at home. We will follow up in 3 months to check in on her progress and evaluate for repeat injection.   She may contact the office in the interim with any concerns or worsening symptoms.     Patient was evaluated independently by the  PA.  Relevant imaging was reviewed by me personally today.    Leanord Hawking, PA-C, 10/15/2019      Nursing Communication:  None

## 2019-10-20 ENCOUNTER — Ambulatory Visit: Payer: No Typology Code available for payment source | Attending: Internal Medicine | Admitting: Internal Medicine

## 2019-10-20 DIAGNOSIS — K5730 Diverticulosis of large intestine without perforation or abscess without bleeding: Secondary | ICD-10-CM | POA: Diagnosis not present

## 2019-10-20 DIAGNOSIS — E785 Hyperlipidemia, unspecified: Secondary | ICD-10-CM | POA: Insufficient documentation

## 2019-10-20 DIAGNOSIS — K429 Umbilical hernia without obstruction or gangrene: Secondary | ICD-10-CM

## 2019-10-20 DIAGNOSIS — N281 Cyst of kidney, acquired: Secondary | ICD-10-CM | POA: Insufficient documentation

## 2019-10-20 DIAGNOSIS — K648 Other hemorrhoids: Secondary | ICD-10-CM | POA: Diagnosis present

## 2019-10-20 DIAGNOSIS — K449 Diaphragmatic hernia without obstruction or gangrene: Secondary | ICD-10-CM | POA: Diagnosis present

## 2019-10-20 DIAGNOSIS — R232 Flushing: Secondary | ICD-10-CM | POA: Insufficient documentation

## 2019-10-20 DIAGNOSIS — N950 Postmenopausal bleeding: Secondary | ICD-10-CM | POA: Insufficient documentation

## 2019-10-20 MED ORDER — FLUOXETINE HCL 20 MG PO CAPS
20.0000 mg | ORAL_CAPSULE | Freq: Every day | ORAL | 3 refills | Status: DC
Start: 2019-10-20 — End: 2020-10-28

## 2019-10-20 MED ORDER — HYDROCORTISONE ACETATE 25 MG PR SUPP
25.00 mg | Freq: Two times a day (BID) | RECTAL | 1 refills | Status: AC
Start: 2019-10-20 — End: 2019-12-19

## 2019-10-20 MED ORDER — GEMFIBROZIL 600 MG PO TABS
600.0000 mg | ORAL_TABLET | Freq: Two times a day (BID) | ORAL | 3 refills | Status: DC
Start: 2019-10-20 — End: 2020-10-28

## 2019-10-20 MED FILL — FLUOXETINE 20MG CAPS: 90 days supply | Qty: 90 | Fill #0

## 2019-10-20 MED FILL — GEMFIBROZIL 600MG: 90 days supply | Qty: 180 | Fill #0

## 2019-10-20 NOTE — Progress Notes (Signed)
SUBJECTIVE:    Mrs. Gose is a 64 y.o who presents for tele visit      Voiced concerns of having RLQ abdominal pain hx of . Post menopausal bleeding    Abdominal/pelvic CT scan results with impression of     FINDINGS:     Lower thorax: Mild dependent changes, subsegmental atelectasis.     Liver: Small right renal cyst. 2 additional subcentimeter hypodensities which are too small to accurately characterize, probably cysts, unchanged.     Gallbladder: No radiodense stones. No pericholecystic fluid.     Biliary: No biliary ductal dilatation.     Pancreas: Unremarkable.     Spleen: Unremarkable.     Adrenals: Unremarkable.     Kidneys: No hydronephrosis. Normal renal enhancement. There are 2 subcentimeter hypodensities in the right kidney which are probably cysts, unchanged     Stomach: Small hiatal hernia     Bowel: No obstruction or inflammatory change. Colonic diverticulosis.     Appendix: No evidence of appendicitis.     Peritoneal cavity: No ascites or fluid collection. No free air. . Slight haziness to the abdominal mesentery, unchanged.     Bladder: Unremarkable.     Reproductive organs: Unremarkable.     Vessels: No aortic aneurysm. Patent portal veins. Mild atherosclerosis. Retroaortic left renal vein.     Lymph nodes: No lymphadenopathy.     Abdominal wall: Minimal fat-containing umbilical hernia.     Bones: Mild degenerative changes of the spine     IMPRESSION:     No acute abdominopelvic pathology or findings to explain symptoms.   Colonic diverticulosis.       Will be traveling to [place] 7/1 to visit her daughter may be returning mid September  Requets to have refills for her medications     OBJECTIVE:  General: A+0x3, speaking in full sentences      ASSESSMENT/PLAN  (N95.0) Postmenopausal bleeding  (primary encounter diagnosis)  Comment:   Plan: scheduled to see OB/GYN 6/8 EMB  Normal reproductive organs, pt reassured     (N28.1) Renal cyst  Comment: probably benign  Will still refer to Urology  for consult   Plan: [facility] ( INT)            (K57.30) Diverticulosis of colon  Comment:   Plan: advised pt to increase fluids, fiber intake, avoid constipation. S/sx of diverticulitis reviewed with pt- report to UC or ED if theses symptoms occur     (E78.5) Hyperlipidemia LDL goal < 130  Comment: med refill per pt request   Plan: gemfibrozil (LOPID) 600 MG tablet           (R23.2) Hot flashes not due to menopause  Comment: refill per pt request  Plan: FLUoxetine (PROZAC) 20 MG capsule            (K44.9) Hiatal hernia  Comment: small hitatal hernia  Hx of GERD, currently taking Omperazole, wil continue to monitor. Pt agreed to plan   Plan:     (W10.9) Umbilical hernia without obstruction and without gangrene  Comment:   Plan: minimal   Avoid heavy lifting     (K64.8) Internal hemorrhoids  Comment: refill per pt request   Plan: hydrocortisone (ANUSOL-HC) 25 MG suppository

## 2019-10-21 ENCOUNTER — Ambulatory Visit
Payer: No Typology Code available for payment source | Attending: Obstetrics & Gynecology | Admitting: Physician Assistant

## 2019-10-21 ENCOUNTER — Other Ambulatory Visit: Payer: Self-pay

## 2019-10-21 ENCOUNTER — Encounter (HOSPITAL_BASED_OUTPATIENT_CLINIC_OR_DEPARTMENT_OTHER): Payer: Self-pay | Admitting: Physician Assistant

## 2019-10-21 VITALS — BP 119/81 | HR 82 | Ht 64.96 in | Wt 160.0 lb

## 2019-10-21 DIAGNOSIS — N950 Postmenopausal bleeding: Secondary | ICD-10-CM | POA: Diagnosis present

## 2019-10-21 DIAGNOSIS — Z113 Encounter for screening for infections with a predominantly sexual mode of transmission: Secondary | ICD-10-CM | POA: Diagnosis present

## 2019-10-21 DIAGNOSIS — Z124 Encounter for screening for malignant neoplasm of cervix: Secondary | ICD-10-CM | POA: Diagnosis not present

## 2019-10-21 MED ORDER — IBUPROFEN 600 MG PO TABS
600.00 mg | ORAL_TABLET | Freq: Once | ORAL | Status: AC
Start: 2019-10-21 — End: 2019-10-21
  Administered 2019-10-21: 600 mg via ORAL

## 2019-10-21 NOTE — Progress Notes (Signed)
64yo presents for evaluation of PMB    Pt has had 2 recent episodes of PMB: 1 month ago: cramping with "quick" vaginal bleeding: several days. Flow was light.  2 1/2 mo ago: similar bleeding pattern.  Pt endorses episode of vb every couple of months for past several years, lasts 1-3 days, typically without cramping, always light flow.  Menarche >10 years ago per pt. 06/08/2008    2010: pt was with Mirena: was with irregular bleeding: removed and EMB done at that time: benign, possible polyp: plan was to monitor for further irregular bleeding    2018: Pt was with PMB: light vb x 3 days: light, with pelvic pain:   [place] 12/2016: thin homogenous endometrium  Gyn consult 03/2017: plan was if occurred again obtain an EMB    Pt has not been sexually active for >5 years and denies vulvovaginal irritation, pain, abn discharge.  Pt denies h/o rectal bleeding or hematuria.    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      BP 119/81   Pulse 82   Ht 5' 4.96" (1.65 m)   Wt 72.6 kg (160 lb)   LMP 06/08/2008   BMI 26.66 kg/m     Physical Exam  Constitutional:       Appearance: Normal appearance.   Genitourinary:      Pelvic exam was performed with patient in the lithotomy position.      Vulva, urethra and uterus normal.      No signs of injury or lesions in the vagina.      Vaginal atrophy present.      No vaginal discharge, bleeding or ulceration.      No cervical motion tenderness, lesion, erythema,  bleeding or polyp.      Right and left adnexa are non-palpable.   Cardiovascular:      Rate and Rhythm: Normal rate.   Pulmonary:      Effort: Pulmonary effort is normal.   Neurological:      Mental Status: She is alert.   Psychiatric:         Mood and Affect: Mood normal.         Behavior: Behavior normal.       03/2014 ENDOMETRIAL BIOPSY:   WEAKLY PROLIFERATIVE PATTERN ENDOMETRIUM WITH BREAKDOWN. FOCAL DECIDUALIZED CHANGE OF STROMA, SUGGESTIVE OF HORMONE TREATMENT EFFECT. NO HYPERPLASIA IDENTIFIED.    10/2016 ENDOCERVIX/EXOCERVIX (THINPREP PAP):   NEGATIVE FOR INTRAEPITHELIAL LESION OR MALIGNANCY.   ATROPHIC. SATISFACTORY FOR EVALUATION, TRANSFORMATION ZONE COMPONENT IS PRESENT    12/2016: Pelvic US  FINDINGS:    Uterus: 6.4 x 3.3 x 4 point cm. The myometrium  shows a 14 x 14 x 15 mm intramural fundal fibroid.    Endometrium: 2.5 mm. Appearance is unremarkable.    IMPRESSION:    The endometrium is thin and uniform. Neither ovary could be visualized.     PATIENT/PROCEDURE VERIFICATION DOCUMENTATION    Correct patient: Yes  Correct procedure: Yes  Correct side, site, mark visible if applicable: Yes  Correct position: Yes  Special equipment/implant(s) present, if applicable: Yes    Time-out completed, documented by provider doing procedure or designated team member:  Charlyn Minerva, PA-C    10/21/2019    3:35 PM    Risks reviewed and verbal consent given.    Procedure:  Uterus palpated: anteverted, normal size and non-tender  Vagina and cervix prepped with betadine.  Paracervical block not administered  Tenaculum applied.  Dilation required: easily done and pt tolerated  Endometrial sample obtained with Pipelle.  Scant amount of tissue obtained as patient pulled away 2/2 discomfort and would not allow re-passage of the pipelle   Cavity 6cm.  Patient tolerated procedure well.    Post Pain Assessment:  Post pain assessment done. Patient rates pain as a 5 on a 0-10 pain scale. Pt was given 600mg  ibuprofen      A/P: 64yo  G4P4 post-menopasual female with PMB every couple of months for past 2+ years.    Reviewed vb abnormal postmenopausal. As pt confident vaginal and not rectal or urethral, and exam unremarkable without vaginal source noted: recommended endometrial sampling.  Did review option for PUS then EMB though given duration of vaginal bleeding [place] results would not change plan for EMB: pt opted to have today.  Pelvic US ordered  If EMB sample too scant: pt aware repeat would be recommended: pt would like sampling to be done under anesthesia- reasonable given amount of discomfort today.    Pap and GC/CT obtained today.    Will call pt with results and f/u plan.     I spent a total of 35 minutes on this visit on the date of service (total time includes all activities performed on the date of service)    [name] interpreter [phone] used for this visit  Charlyn Minerva, PA-C

## 2019-10-22 LAB — CHLAMYDIA GC NAAT
CHLAMYDIA TRACHOMATIS NAAT: NEGATIVE
NEISSERIA GONORRHOEAE NAAT: NEGATIVE

## 2019-10-22 LAB — HUMAN PAPILLOMAVIRUS (HPV): HUMAN PAPILLOMAVIRUS: NEGATIVE

## 2019-10-22 MED FILL — HYDROCORT AC SUP 25MG: 30 days supply | Qty: 60 | Fill #0 | Status: CP

## 2019-10-24 ENCOUNTER — Ambulatory Visit (HOSPITAL_BASED_OUTPATIENT_CLINIC_OR_DEPARTMENT_OTHER): Payer: Self-pay | Admitting: Obstetrics & Gynecology

## 2019-10-27 LAB — SURGICAL PATH SPECIMEN GYN

## 2019-10-28 LAB — CYTOPATH, C/V, THIN LAYER

## 2019-10-30 ENCOUNTER — Other Ambulatory Visit (HOSPITAL_BASED_OUTPATIENT_CLINIC_OR_DEPARTMENT_OTHER): Payer: Self-pay | Admitting: Internal Medicine

## 2019-10-30 ENCOUNTER — Other Ambulatory Visit (HOSPITAL_BASED_OUTPATIENT_CLINIC_OR_DEPARTMENT_OTHER): Payer: Self-pay | Admitting: Physician Assistant

## 2019-10-30 ENCOUNTER — Other Ambulatory Visit: Payer: Self-pay

## 2019-10-30 ENCOUNTER — Ambulatory Visit
Admission: RE | Admit: 2019-10-30 | Discharge: 2019-10-30 | Disposition: A | Discharge status: Home / Self Care | Payer: No Typology Code available for payment source | Attending: Physician Assistant | Admitting: Physician Assistant

## 2019-10-30 DIAGNOSIS — I10 Essential (primary) hypertension: Secondary | ICD-10-CM

## 2019-10-30 DIAGNOSIS — R1084 Generalized abdominal pain: Secondary | ICD-10-CM

## 2019-10-30 DIAGNOSIS — D251 Intramural leiomyoma of uterus: Secondary | ICD-10-CM

## 2019-10-30 DIAGNOSIS — N950 Postmenopausal bleeding: Secondary | ICD-10-CM

## 2019-10-30 MED ORDER — AMLODIPINE BESYLATE 5 MG PO TABS
5.0000 mg | ORAL_TABLET | Freq: Every day | ORAL | 3 refills | Status: DC
Start: 2019-10-30 — End: 2020-08-13

## 2019-10-30 MED ORDER — OMEPRAZOLE 20 MG PO CPDR
20.0000 mg | DELAYED_RELEASE_CAPSULE | Freq: Every day | ORAL | 0 refills | Status: DC
Start: 2019-10-30 — End: 2020-03-26

## 2019-10-30 MED FILL — OMEPRAZOLE 20MG: 90 days supply | Qty: 90 | Fill #0

## 2019-10-30 MED FILL — AMLODIPINE 5MG: 90 days supply | Qty: 90 | Fill #0

## 2019-10-30 NOTE — Telephone Encounter (Signed)
PER Patient (self), Tina [name] is a 64 year old female has requested a refill of      -  Omeprazole (PRILOSEC) 20 MG capsule    - AmLODIPine (NORVASC) 5 MG tablet      Last Office Visit: 10/21/19 with Charlyn Minerva  Last Physical Exam: 10/27/16     FECAL OCCULT BLOOD AGE 41+ due on 01/04/2019     Other Med Adult:  Most Recent BP Reading(s)  10/21/19 : 119/81        Cholesterol (mg/dL)   Date Value   04/26/2018 244 (*H)     LOW DENSITY LIPOPROTEIN DIRECT (mg/dL)   Date Value   04/26/2018 159     HIGH DENSITY LIPOPROTEIN (mg/dL)   Date Value   04/26/2018 58     TRIGLYCERIDES (mg/dL)   Date Value   04/26/2018 116         THYROID SCREEN TSH REFLEX FT4 (uIU/mL)   Date Value   12/20/2017 1.500         TSH (THYROID STIM HORMONE) (uIU/mL)   Date Value   09/23/2010 1.52       HEMOGLOBIN A1C (%)   Date Value   01/07/2018 5.6       No results found for: POCA1C      No results found for: INR    SODIUM (mmol/L)   Date Value   05/31/2018 141       POTASSIUM (mmol/L)   Date Value   05/31/2018 4.7           CREATININE (mg/dL)   Date Value   05/31/2018 0.7        Documented patient preferred pharmacies:    [facility], [place]  Phone: [phone] Fax: [phone]

## 2019-10-31 ENCOUNTER — Telehealth (HOSPITAL_BASED_OUTPATIENT_CLINIC_OR_DEPARTMENT_OTHER): Payer: Self-pay | Admitting: Physician Assistant

## 2019-10-31 NOTE — Telephone Encounter (Signed)
Call to pt with [place] interpreter and left voicemail to call back [facility].

## 2019-10-31 NOTE — Telephone Encounter (Signed)
Margee is a 64yo G84P4 post-menopasual female with PMB every couple of months for past 2+ years with h/o EMB 2010 "possible endometrial polyp"     EMB 10/21/2019: Benign Endometrial polyp.  Recommend discussion with MD re hysteroscopy(?) & removal given duration of recurrent PMB.    Charlyn Minerva, PA-C

## 2019-10-31 NOTE — Telephone Encounter (Signed)
Call to pt with [place] interpreter and left voicemail to call back [facility].    call back for results  Received: Today  [name]  P Rhcob Rn [name]  [facility]     Tina [name] [id number], 64 year old, female, Telephone Information:   Home Phone   [phone]   Work Phone   Not on file.   Mobile     [phone]       Patient's Preferred Pharmacy:     [facility] [place]   Phone: [phone] Fax: [phone]       CONFIRMED TODAY: Yes     CALL BACK NUMBER: [phone]     Patient's language of care: [name])     Patient does not need an interpreter.     Patient's PCP: [name], APRN     Person calling on behalf of patient: Patient (self)     Calls today Returning phonecall

## 2019-11-03 ENCOUNTER — Other Ambulatory Visit: Payer: Self-pay

## 2019-11-03 NOTE — Telephone Encounter (Signed)
-----   Message from [name] sent at 11/03/2019  9:29 AM EDT -----  Regarding: return call for results  [place] [id number], 65 year old, female, Telephone Information:  Home Phone      [phone]  Work Phone      Not on file.  Mobile          [phone]      Patient's Preferred Pharmacy:     [facility] [place], [place]  Phone: [phone] Fax: [phone]      CONFIRMED TODAY: Yes    CALL BACK NUMBER:  [phone]    Patient's language of care: [name]    Patient needs a [name] interpreter.    Patient's PCP: [name], APRN    Person calling on behalf of patient: Patient (self)    Calls today Returning phonecall

## 2019-11-03 NOTE — Telephone Encounter (Signed)
Reached pt via [place]. Results message relayed to pt.  She would like GYN appt, will be away until 01/28/20, please schedule with MD.

## 2019-12-29 ENCOUNTER — Other Ambulatory Visit: Payer: Self-pay

## 2019-12-29 ENCOUNTER — Ambulatory Visit (HOSPITAL_BASED_OUTPATIENT_CLINIC_OR_DEPARTMENT_OTHER): Payer: Self-pay

## 2019-12-29 NOTE — Telephone Encounter (Signed)
Regarding: runny nose,cough,HA,body aches.  Wants covid test  ----- Message from Ardyth Man sent at 12/29/2019 10:32 AM EDT -----  Tina [name] [id number], 64 year old, female    Calls today:  Sick    What are the symptoms runny nose,cough,HA,body aches.  Wants covid test  How long has patient been sick?Sunday  What has pt. tried at home   Person calling on behalf of patient: Patient (self)    CALL BACK NUMBER:[phone]  Best time to call back:   Cell phone:   Other phone:    Patient's language of care: [name] ([place]    Patient does  need an interpreter.    Patient's PCP: [name], APRN

## 2019-12-29 NOTE — Telephone Encounter (Signed)
Call to pt through interpretor. C/o non productive cough, HA, body aches and runny nose for 3 days. Denies fever, CP or SOB. Recently traveled to [place].    Scheduled for ACC. Advised to quarantine until neg test. Plenty of fluids and tylenol for pain. Agrees with plan.

## 2019-12-30 ENCOUNTER — Ambulatory Visit: Payer: No Typology Code available for payment source | Attending: Family Medicine | Admitting: Medical

## 2019-12-30 ENCOUNTER — Encounter (HOSPITAL_BASED_OUTPATIENT_CLINIC_OR_DEPARTMENT_OTHER): Payer: Self-pay | Admitting: Medical

## 2019-12-30 VITALS — BP 119/74 | HR 73 | Temp 98.2°F | Wt 163.6 lb

## 2019-12-30 DIAGNOSIS — R5381 Other malaise: Secondary | ICD-10-CM

## 2019-12-30 DIAGNOSIS — J029 Acute pharyngitis, unspecified: Secondary | ICD-10-CM | POA: Insufficient documentation

## 2019-12-30 DIAGNOSIS — Z20822 Contact with and (suspected) exposure to covid-19: Secondary | ICD-10-CM | POA: Insufficient documentation

## 2019-12-30 DIAGNOSIS — R5383 Other fatigue: Secondary | ICD-10-CM

## 2019-12-30 DIAGNOSIS — R05 Cough: Secondary | ICD-10-CM | POA: Diagnosis present

## 2019-12-30 DIAGNOSIS — R058 Other specified cough: Secondary | ICD-10-CM

## 2019-12-30 NOTE — Progress Notes (Signed)
Acute Care Clinic Note    Subjective  Tina [name] 64 year old [name] ([place]speaking female who presents for evaluation in acute care clinic.    HPI  Date of symptom onset: 12/27/19  Coronavirus Vaccine Status: Two doses of Pfizer/Moderna, completed on 09/01/19    - Current day of symptoms: 4  - Started with fatigue, headache, and then cough with sore throat  - No fevers  - Cough is dry  - No shortness of breath, no wheezing  - No nausea, vomiting  - No loss of smell or taste  - No sick contacts  - No chest pain, palpitations, dizziness  - Non smoker  - Advil is helping with myalgias and sore throat    Pertinent occupation, housing, or social comments: Recently travelled to [place]      Per chart review prior to visit:  Review of Patient's Allergies indicates:   Pravachol               Nausea Only, Other (See Comments)    Comment:Malaise   Simvastatin             Palpitations    Comment:"glottis swollen" in past, but says allergy             tested, not allergic to this med per pt 8/19    Objective  BP 119/74 (Site: RA, Position: Sitting, Cuff Size: Reg)   Pulse 73   Temp 98.2 F (36.8 C) (Temporal)   Wt 74.2 kg (163 lb 9.6 oz)   LMP 06/08/2008   SpO2 96%   BMI 27.26 kg/m        Most Recent O2 Sat Reading(s)  12/30/19 : 96%  09/18/19 : 98%  09/20/18 : 98%    Gen: No respiratory distress  HEENT:  Nares: patent  CV: rrr nl s1, s2. No mrg  Pulm: CTAB no rhonchi, no wheezing, no bibasilar rales  Skin: good skin turgor, not diaphoretic  Neuro: alert and oriented to time, person and place  Psych: normal affect. Normal thought content, speech, mood are noted.    Assessment & Plan    1. Dry cough  2. Malaise and fatigue  3. Sore throat  Afebrile with stable vitals. Lungs clear on exam and no dyspnea, do not suspect CAP. Patient is fully vaccinated for covid, shared decision to test today for breakthrough. Suspect viral URI. Explained rationale for not prescribing antibiotics today. Discussed usual course  of illness and importance of rest and fluids.  - Discussed symptomatic management with tylenol and ibuprofen prn  - Discussed rest, fluids, humidified air, nasal saline spray/rinse, salt water gargles, and tea + honey for symptomatic relief.   - No follow up needed at this time, return to clinic prn  - Instructed to call if develops worsening symptoms or shortness of breath, chest pain, persistent fever.     I spent a total of 21 minutes on this visit on the date of service (total time includes all activities performed on the date of service)    Ennis Forts, PA-C

## 2019-12-31 LAB — COVID-19 OUTPATIENT: COVID-19 OUTPATIENT: NEGATIVE

## 2020-01-29 ENCOUNTER — Ambulatory Visit (HOSPITAL_BASED_OUTPATIENT_CLINIC_OR_DEPARTMENT_OTHER): Payer: Self-pay | Admitting: Urology

## 2020-01-29 MED FILL — AMLODIPINE 5MG: 90 days supply | Qty: 90 | Fill #1

## 2020-01-29 MED FILL — GEMFIBROZIL 600MG: 90 days supply | Qty: 180 | Fill #1

## 2020-01-29 MED FILL — FLUOXETINE 20MG CAPS: 90 days supply | Qty: 90 | Fill #1

## 2020-02-05 ENCOUNTER — Other Ambulatory Visit: Payer: Self-pay

## 2020-02-05 ENCOUNTER — Ambulatory Visit: Payer: No Typology Code available for payment source | Attending: Orthopaedic Surgery | Admitting: Orthopaedic Surgery

## 2020-02-05 DIAGNOSIS — M160 Bilateral primary osteoarthritis of hip: Secondary | ICD-10-CM | POA: Diagnosis present

## 2020-02-05 DIAGNOSIS — G8929 Other chronic pain: Secondary | ICD-10-CM

## 2020-02-05 DIAGNOSIS — M5442 Lumbago with sciatica, left side: Secondary | ICD-10-CM

## 2020-02-05 DIAGNOSIS — G5702 Lesion of sciatic nerve, left lower limb: Secondary | ICD-10-CM | POA: Diagnosis present

## 2020-02-05 DIAGNOSIS — M7062 Trochanteric bursitis, left hip: Secondary | ICD-10-CM

## 2020-02-05 NOTE — Progress Notes (Addendum)
Orthopedic Office Note    CC: Patient presents with:  Follow Up: L hip    ORTHOPEDIC PROBLEM LIST:  1. Left piriformis tendinitis  2. Bilateral hip osteoarthritis    HPI: Tina [name] is a 64 year old female who is evaluated today for follow up of left hip pain.     To review, her pain has been present since 2018. Patient localized the pain to the left buttock area, just posterior to the greater trochanter in the area of the piriformis tendon and the area of the sciatic notch. No previous injury or trauma. She has seen Dr. Bjorn Loser in the past and had trochanteric bursitis injections. She occasionally has anterior hip pain as well, and he referred her to [place] for consideration for an US guided hip injection. Denies any numbness or tingling distally. Previous presentation was believed to be consistent with left piriformis syndrome. Her pain was not believed to be attributed to arthritis, thus she was given a piriformis cortisone injection on 10/15/19, not an intra-articular hip injection. She states the injection did help completely. She notices the pain most when she is carrying something heavy. 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 She localized the pain over her buttocks and left groin. She did a PT visit in the past, but apparently only one, then tried to do exercises on her own at home.    She is feeling quite well and without pain today.    Tina [name] was utilized to conduct this televisit.     ROS: Review of systems filled out by patient and scanned into the medical record. Reviewed by me and all other systems are negative except as noted in HPI.    IMAGING: x-ray of the bilateral hips and AP pelvis reveals significant pincer deformities bilaterally with preserved joint space. Imaging was reviewed by myself an Dr. Kennyth Lose at today's visit.     PHYSICAL EXAM:  GENERAL: Alert and oriented, in no acute distress.    MOOD: Appropriate.   HENT: NCAT,  sclera anicteric, EOMI.  RESP: Breathing unlabored.  Coordination and balance: grossly normal   MUSCULOSKELETAL: Evaluation of the left hip and low back reveals no gross bony deformities. Overlying skin is warm, dry and intact. There is no erythema, edema, ecchymosis or effusion. Patient NTTP over the left trochanteric bursa. NTTP over piriformis tendon insertion. NTTP along spinous processes. Able to flatten lordosis on forward flexion. Leg is neurovascularly intact distally. SLT in all fields.     ASSESSMENT/PLAN: 64 year old female with left trochanteric bursitis and mild bilateral hip osteoarthritis with low back pain, now resolved with injection in June 2021. She was initially referred for consideration for an intra-articular injection of the left hip under ultrasound, however at last visit her presentation was more consistent with piriformis syndrome and thus she was given an injection at the insertion of the piriformis syndrome. This provided her complete pain relief. She never did PT but has had no recurrence of pain. She was shown some exercises in clinic today to do if the pain returns. She can follow up as needed.     Dr. Kennyth Lose saw and examined the patient and formulated the assessment and plan.  Relevant imaging reviewed today as noted above.    Leanord Hawking, PA-C, 02/05/2020      Nursing Communication:  None     I Dr. Kennyth Lose interviewed and examined the patient and reviewed her radiographs with  [name] PA-C.  Please refer to his notes for further details.  I agree with the assessment and recommendations as noted.    Patient is a 64 year old female with left trochanteric bursitis and bilateral hip arthritis.  At this point in time her symptoms seem to be more related to a piriformis syndrome.  For this a steroid injection was administered today (see procedure note).    She was shown an exercise program to do at home.    [name] MD [phone]

## 2020-02-10 ENCOUNTER — Encounter (HOSPITAL_BASED_OUTPATIENT_CLINIC_OR_DEPARTMENT_OTHER): Payer: Self-pay | Admitting: Obstetrics & Gynecology

## 2020-03-09 ENCOUNTER — Emergency Department
Admission: EM | Admit: 2020-03-09 | Discharge: 2020-03-09 | Disposition: A | Discharge status: Home / Self Care | Payer: No Typology Code available for payment source | Attending: Emergency Medicine | Admitting: Emergency Medicine

## 2020-03-09 ENCOUNTER — Other Ambulatory Visit: Payer: Self-pay

## 2020-03-09 ENCOUNTER — Emergency Department (HOSPITAL_BASED_OUTPATIENT_CLINIC_OR_DEPARTMENT_OTHER): Payer: No Typology Code available for payment source

## 2020-03-09 ENCOUNTER — Ambulatory Visit: Payer: No Typology Code available for payment source | Attending: Internal Medicine | Admitting: Internal Medicine

## 2020-03-09 ENCOUNTER — Encounter (HOSPITAL_BASED_OUTPATIENT_CLINIC_OR_DEPARTMENT_OTHER): Payer: Self-pay

## 2020-03-09 DIAGNOSIS — R0602 Shortness of breath: Secondary | ICD-10-CM | POA: Diagnosis present

## 2020-03-09 DIAGNOSIS — R0789 Other chest pain: Secondary | ICD-10-CM | POA: Insufficient documentation

## 2020-03-09 DIAGNOSIS — R079 Chest pain, unspecified: Secondary | ICD-10-CM | POA: Diagnosis present

## 2020-03-09 DIAGNOSIS — R9431 Abnormal electrocardiogram [ECG] [EKG]: Secondary | ICD-10-CM

## 2020-03-09 LAB — CBC, PLATELET & DIFFERENTIAL
ABSOLUTE BASOPHIL COUNT MANUAL: 0.1 10*3/uL (ref 0.0–0.1)
ABSOLUTE EO COUNT MANUAL: 0.3 10*3/uL (ref 0.0–0.8)
ABSOLUTE LYMPH COUNT MANUAL: 1.7 10*3/uL (ref 0.6–5.9)
ABSOLUTE MONOCYTE COUNT MANUAL: 0.3 10*3/uL (ref 0.2–1.4)
ABSOLUTE NEUT COUNT MANUAL: 3.4 THuL (ref 1.6–8.3)
ABSOLUTE NRBC COUNT: 0 10*3/uL (ref 0.0–0.0)
BASOPHILS %: 1 % (ref 0.0–1.2)
EOSINOPHILS %: 5 % (ref 0.0–7.0)
HEMATOCRIT: 38.1 % (ref 34.1–44.9)
HEMOGLOBIN: 12.2 g/dL (ref 11.2–15.7)
LYMPHOCYTES %: 30 % (ref 15.0–54.0)
MEAN CORP HGB CONC: 32 g/dL (ref 31.0–37.0)
MEAN CORPUSCULAR HGB: 30.8 pg (ref 26.0–34.0)
MEAN CORPUSCULAR VOL: 96.2 fl (ref 80.0–100.0)
MEAN PLATELET VOLUME: 12.2 fL (ref 8.7–12.5)
MONOCYTES %: 5 % (ref 4.0–13.0)
NRBC %: 0 % (ref 0.0–0.0)
NUCLEATED RED BLOOD CELLS: 0 /100 WC (ref 0.0–0.0)
PLATELET COUNT: 220 10*3/uL (ref 150–400)
PLATELET ESTIMATE: NORMAL
POLYMORPHONUCLEAR (SEGS) %: 59 % (ref 40.0–75.0)
RBC DISTRIBUTION WIDTH STD DEV: 42.8 fL (ref 35.1–46.3)
RED BLOOD CELL COUNT: 3.96 M/uL (ref 3.90–5.20)
WHITE BLOOD CELL COUNT: 5.7 10*3/uL (ref 4.0–11.0)

## 2020-03-09 LAB — BASIC METABOLIC PANEL
ANION GAP: 7 mmol/L (ref 5–15)
BUN (UREA NITROGEN): 12 mg/dL (ref 7–18)
CALCIUM: 8.6 mg/dL (ref 8.5–10.1)
CARBON DIOXIDE: 24 mmol/L (ref 21–32)
CHLORIDE: 109 mmol/L — ABNORMAL HIGH (ref 98–107)
CREATININE: 0.8 mg/dL (ref 0.4–1.2)
ESTIMATED GLOMERULAR FILT RATE: >60 mL/min (ref >60)
Glucose Random: 136 mg/dL (ref 74–160)
POTASSIUM: 3.9 mmol/L (ref 3.5–5.1)
SODIUM: 140 mmol/L (ref 136–145)

## 2020-03-09 LAB — TROPONIN I
TROPONIN I: <0.02 ng/mL (ref 0.00–0.04)
TROPONIN I: <0.02 ng/mL (ref 0.00–0.04)

## 2020-03-09 LAB — HEPATIC FUNCTION PANEL
ALANINE AMINOTRANSFERASE: 22 U/L (ref 12–45)
ALBUMIN: 3.3 g/dL — ABNORMAL LOW (ref 3.4–5.0)
ALKALINE PHOSPHATASE: 102 U/L (ref 45–117)
ASPARTATE AMINOTRANSFERASE: 13 U/L (ref 8–34)
BILIRUBIN DIRECT: <0.1 mg/dl (ref 0.0–0.2)
BILIRUBIN TOTAL: 0.2 mg/dL (ref 0.2–1.0)
TOTAL PROTEIN: 6.8 g/dL (ref 6.4–8.2)

## 2020-03-09 LAB — MAGNESIUM: MAGNESIUM: 2.2 mg/dL (ref 1.8–2.4)

## 2020-03-09 LAB — LIPASE: LIPASE: 119 U/L (ref 73–393)

## 2020-03-09 LAB — COVID-19 INPATIENT: COVID-19 INPATIENT: NEGATIVE

## 2020-03-09 LAB — HOLD BLUE TOP TUBE

## 2020-03-09 MED ORDER — ASPIRIN 325 MG PO TABS
325.00 mg | ORAL_TABLET | Freq: Once | ORAL | Status: AC
Start: 2020-03-09 — End: 2020-03-09
  Administered 2020-03-09: 325 mg via ORAL
  Filled 2020-03-09: qty 1

## 2020-03-09 NOTE — ED Triage Note (Signed)
Pt arrives ambulatory to the ED reporting exertional chest pain and shortness of breath x 2 days. Describes pain as burning sensation in her L chest with radiation into her L arm. Pt denies N/V, diaphoresis. States dizziness. Chest pain free at this time.

## 2020-03-09 NOTE — ED Provider Notes (Signed)
The patient was seen primarily by me. ED nursing record was reviewed. Prior records as available electronically through the Epic record were reviewed.    HPI:    This is a 64 year old female patient with history of hypertension, hyperlipidemia presenting with chest pain.  Patient reports 2 days of intermittent chest pain and burning.  Patient reports that the center and going to the left.  Patient reports that she has been having shortness of breath with exertion that is been ongoing for several weeks.  Patient reports that when she goes up the hill for work she has to stop her when she is going up the stairs.  Patient reports that she never had chest pain with the symptoms in the past.  Patient reports these intermittent chest pain can occur at any time.  Patient reports that the pain last longer than a few minutes less than an hour.  Patient reports some dizziness.  Patient reports no nausea vomiting.  Patient reports no abdominal pain.  Patient did have a stress test years ago.      ROS: Pertinent positives were reviewed as per the HPI above. All other systems were reviewed and are negative.      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                No signs of infection on the right. Sent for hearing                test.   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     Current Outpatient Medications   Medication Sig    amLODIPine (NORVASC) 5 MG tablet Take 1 tablet by mouth daily    gemfibrozil (LOPID) 600 MG tablet Take 1 tablet by mouth 2 (two) times daily before meals    FLUoxetine (PROZAC) 20 MG capsule Take 1 capsule by  mouth daily    meloxicam (MOBIC) 15 MG tablet Take 1 tablet by mouth daily    Cholecalciferol (VITAMIN D3) 1000 units TABS Tablet TAKE ONE TABLET BY MOUTH DAILY    EPINEPHrine 0.3 MG/0.3ML auto-injector Inject 0.3 mg into the muscle once as needed for up to 1 dose     Facility-Administered Medications Ordered in Other Encounters   Medication    sodium chloride 0.9 % flush 10-100 mL         Social History: Social History    Tobacco Use      Smoking status: Former Smoker      Smokeless tobacco: Never Used    Alcohol use: Yes      Comment: Occasionally        Allergies:  Review of Patient's Allergies indicates:   Pravachol               Nausea Only, Other (See Comments)    Comment:Malaise   Simvastatin             Palpitations    Comment:"glottis swollen" in past, but says allergy             tested, not allergic to this med per pt 8/19      Physical Exam:  BP 150/87   Pulse 83   Temp 98.7 F   Resp 20   LMP 06/08/2008   SpO2 99%     GENERAL: No acute distress.   SKIN:  Warm & Dry, no rash.  HEAD: Atraumatic. PERRL. EOMI.    NECK: Supple  LUNGS:  Clear to auscultation bilaterally. No wheezes, rales, rhonchi.    HEART:  RRR.  No murmurs, rubs, or gallops.   ABDOMEN:  Soft, NTND.  No guarding or rebound tenderness.   MUSCULOSKELETAL:  No obvious deformities.  No Shrem edema no calf tenderness  NEUROLOGIC: Alert and oriented.  Moves all extremities well.  PSYCHIATRIC:  Appropriate for age, time of day, and situation        ED Course and Medical Decision-making:    The patient is a  65 year old female with intermittent chest burning for the past 2 days in the setting of shortness of breath with exertion with a history of hypertension and hyperlipidemia  Patient's heart score is 3 based on history  ED Course as of Mar 09 1638   Tue Mar 09, 2020   1130 EKG sinus rate of 66 no ST elevations or depressions        Will get blood work and chest x-ray.  Patient offered admission however does not want to stay.  Patient would prefer outpatient stress test.  Discussed with patient's primary care provider and cardiology.  Patient's primary will put in referral for cardiology for outpatient stress.  Patient will return for worsening symptoms.      Diagnosis chest pain  Disposition Home  Condition improved  Serafina Royals, MD

## 2020-03-09 NOTE — Narrator Note (Signed)
Patient Disposition  Patient education for diagnosis, medications, activity, diet and follow-up.  Patient left ED 3:27 PM.  Patient rep received written instructions.    Interpreter to provide instructions: No    Patient belongings with patient: YES    Have all existing LDAs been addressed? Yes    Have all IV infusions been stopped? N/A    Destination: Discharged to home

## 2020-03-09 NOTE — Progress Notes (Signed)
SUBJECTIVE:    [name] is a 64 y.o who presents for tele visit    Voices concerns of having left sided chest pain and left arm pain x 2 days  Describes her chest pain as  burning sensation  Reports having SOB on exertion only  Denies chest pain on exertion      Is at work, feels she does not need to be seen emergently     Is tearful     OBJECTIVE:  General: A+0x3, tearful      ASSESSMENT/PLAN  (R07.9) Chest pain, unspecified type  And   (R06.02) SOB (shortness of breath)  Comment  Plan: symptoms of left sided pain and left arm pain, and SOB on exertion  With hx of hyperlipidemia and HTN  ddx include MI, NSTEMI, PE  Strongly advised pt to report to ED. Pt was hesitant with this pain, however advised pt on the importance of r/o any cardiac or pulmonary event   Spoke with pt boss who advised she would being pt to [facility] ED  Strongly advised is best for pt to be transported via ambulance. Pt actively does not have chest pain at this time, however has SOB on exertion  Call made to [facility] ED, report given

## 2020-03-09 NOTE — Discharge Instructions (Signed)
Your blood work, EKG and chest x-ray were unremarkable today.  Your primary should arrange an outpatient stress test.  If you develop worsening pain or symptoms need to be reevaluated in the emergency department.

## 2020-03-10 ENCOUNTER — Encounter (HOSPITAL_BASED_OUTPATIENT_CLINIC_OR_DEPARTMENT_OTHER): Payer: Self-pay | Admitting: Internal Medicine

## 2020-03-10 ENCOUNTER — Other Ambulatory Visit: Payer: Self-pay

## 2020-03-10 ENCOUNTER — Other Ambulatory Visit (HOSPITAL_BASED_OUTPATIENT_CLINIC_OR_DEPARTMENT_OTHER): Payer: Self-pay | Admitting: Internal Medicine

## 2020-03-10 DIAGNOSIS — R079 Chest pain, unspecified: Secondary | ICD-10-CM

## 2020-03-15 ENCOUNTER — Ambulatory Visit: Payer: No Typology Code available for payment source | Attending: Internal Medicine | Admitting: Internal Medicine

## 2020-03-15 DIAGNOSIS — R079 Chest pain, unspecified: Secondary | ICD-10-CM | POA: Insufficient documentation

## 2020-03-15 NOTE — Progress Notes (Signed)
SUBJECTIVE:    Mrs. Spackman is a 64 y.o who presents for f/u    Seen in ED 10/26 with c/c of intermittent  left sided chest pain.     R/o MI  Was encouraged to be admitted, however pt declined admission.   Was advised to have an outpatient stress test which has been ordered and scheduled 11/17    Since ED visit reports feeling fatigued  Continues to have episodes of left sided chest pain, is burning pain, which is milder than before . Feels fatigued, palpitations, and sob exertion     Would like to receive Pfizer booster    OBJECTIVE:  General: A+0x3, speaking in full sentences      ASSESSMENT/PLAN  (R07.9) Chest pain, unspecified type  Comment:  scheduled to see cardiology, if stress test and echo has any abnormalities, will need to have a sooner appt with Cardiology   Plan: ECHOCARDIOGRAM W/ DOPPLER         Advised can be seen at [place] center     Also desire flu vaccine - will scheduled in person visit with RNs

## 2020-03-16 LAB — EKG

## 2020-03-19 ENCOUNTER — Telehealth (HOSPITAL_BASED_OUTPATIENT_CLINIC_OR_DEPARTMENT_OTHER): Payer: Self-pay | Admitting: Internal Medicine

## 2020-03-19 NOTE — Telephone Encounter (Signed)
Outreached pt w/inteterpreter and LVM asking her to call our office to schedule a flu shot and to give info on the [place] vaccination center.    [name], APRN  P [facility] Televisit Followup Pool  Hello     Please call pt to schedule an appt for flu vaccine with RNs --- or give information on flu clinic and also [place] vaccination clinic     Thank you   Hoyle Sauer

## 2020-03-26 ENCOUNTER — Other Ambulatory Visit (HOSPITAL_BASED_OUTPATIENT_CLINIC_OR_DEPARTMENT_OTHER): Payer: Self-pay | Admitting: Internal Medicine

## 2020-03-26 DIAGNOSIS — R1084 Generalized abdominal pain: Secondary | ICD-10-CM

## 2020-03-26 MED FILL — GEMFIBROZIL 600MG: 90 days supply | Qty: 180 | Fill #1

## 2020-03-26 NOTE — Telephone Encounter (Signed)
PER Pharmacy, Tina [name] is a 64 year old female has requested a refill of omeprazole 20.      Last Office Visit: 03-15-20 with pcp  Last Physical Exam: 10-27-16    FECAL OCCULT BLOOD AGE 67+ due on 01/04/2019    Other Med Adult:  Most Recent BP Reading(s)  03/09/20 : 150/87        Cholesterol (mg/dL)   Date Value   04/26/2018 244 (*H)     LOW DENSITY LIPOPROTEIN DIRECT (mg/dL)   Date Value   04/26/2018 159     HIGH DENSITY LIPOPROTEIN (mg/dL)   Date Value   04/26/2018 58     TRIGLYCERIDES (mg/dL)   Date Value   04/26/2018 116         THYROID SCREEN TSH REFLEX FT4 (uIU/mL)   Date Value   12/20/2017 1.500         TSH (THYROID STIM HORMONE) (uIU/mL)   Date Value   09/23/2010 1.52       HEMOGLOBIN A1C (%)   Date Value   01/07/2018 5.6       No results found for: POCA1C      No results found for: INR    SODIUM (mmol/L)   Date Value   03/09/2020 140       POTASSIUM (mmol/L)   Date Value   03/09/2020 3.9           CREATININE (mg/dL)   Date Value   03/09/2020 0.8       Documented patient preferred pharmacies:    [facility], [place]  Phone: [phone] Fax: [phone]

## 2020-03-29 MED FILL — OMEPRAZOLE 20MG: 90 days supply | Qty: 90 | Fill #0 | Status: CP

## 2020-03-31 ENCOUNTER — Ambulatory Visit
Admission: RE | Admit: 2020-03-31 | Discharge: 2020-03-31 | Disposition: A | Discharge status: Home / Self Care | Payer: No Typology Code available for payment source | Attending: Internal Medicine | Admitting: Internal Medicine

## 2020-03-31 ENCOUNTER — Other Ambulatory Visit: Payer: Self-pay

## 2020-03-31 DIAGNOSIS — R079 Chest pain, unspecified: Secondary | ICD-10-CM

## 2020-03-31 NOTE — Procedures (Deleted)
Helayne Seminole, MD - 12/24/2015  Formatting of this note might be different from the original.      [place] 2-3 VIEWS   12/24/2015 12:12 PM   PATIENT NAME: Tina [name]  64 years old, Female  ORDERING PROVIDER: HELEN N PALATINUS  ACCESSION NUMBER: [id number]  CLINICAL INDICATION:  Back pain . From patient chart: 64 yo F with hx HTN with one mo hx  intermittent L low back and hip pain with radiation  to L knee, no  hx trauma    COMPARISON:None    IMPRESSION:    Preservation of vertebral body height without acute vertebral body  compression fracture. Multilevel mild anterior osteophytosis. Mild  endplate degenerative changes in the lumbar spine are seen.  Suspect facet arthropathy at L5-S1. No subluxation.          Reviewed and Interpreted by: Leron Croak, M.D.  12/24/2015  12:18 PM

## 2020-03-31 NOTE — Procedures (Deleted)
Helayne Seminole, MD - 12/24/2015  Formatting of this note might be different from the original.      XRAY HIP LEFT 2-3 VIEWS   12/24/2015 12:13 PM   PATIENT NAME: Tina [name] [place]  64 years old, Female  ORDERING PROVIDER: Lavonia Dana PALATINUS  ACCESSION NUMBER: [id number]  CLINICAL INDICATION:  Pain/Swelling . From patient chart: 64 yo F with hx HTN with one  mo hx intermittent L low back and hip pain with radiation  to L  knee, no hx trauma    COMPARISON:None    IMPRESSION:    No displaced fracture. No dislocation. Calcifications are seen  adjacent to the left femur greater trochanter as well as the left  acetabulum. No radiopaque soft tissue foreign body.           Reviewed and Interpreted by: Leron Croak, M.D.  12/24/2015  12:21 PM

## 2020-04-02 ENCOUNTER — Telehealth (HOSPITAL_BASED_OUTPATIENT_CLINIC_OR_DEPARTMENT_OTHER): Payer: Self-pay | Admitting: Internal Medicine

## 2020-04-02 NOTE — Telephone Encounter (Signed)
Call made to pt     Borderline abnormal exercise only stress test.  Decreased exercise tolerance for age-exercise time 2 minutes 37 seconds.  Duke score  -2 , intermediate probability of CAD  Recommend stress test with imaging if clinical suspicion for CAD    Left message on pt voicemail, awaiting call back

## 2020-04-07 ENCOUNTER — Ambulatory Visit
Admission: RE | Admit: 2020-04-07 | Discharge: 2020-04-07 | Disposition: A | Discharge status: Home / Self Care | Payer: No Typology Code available for payment source | Attending: Internal Medicine | Admitting: Internal Medicine

## 2020-04-07 ENCOUNTER — Other Ambulatory Visit: Payer: Self-pay

## 2020-04-07 DIAGNOSIS — R079 Chest pain, unspecified: Secondary | ICD-10-CM | POA: Insufficient documentation

## 2020-04-07 LAB — ECHOCARDIOGRAM W/ DOPPLER: LVEF: 60 %

## 2020-04-09 ENCOUNTER — Other Ambulatory Visit: Payer: Self-pay

## 2020-04-09 ENCOUNTER — Ambulatory Visit: Payer: No Typology Code available for payment source | Attending: Internal Medicine

## 2020-04-09 DIAGNOSIS — Z23 Encounter for immunization: Secondary | ICD-10-CM | POA: Diagnosis present

## 2020-04-13 ENCOUNTER — Encounter (HOSPITAL_BASED_OUTPATIENT_CLINIC_OR_DEPARTMENT_OTHER): Payer: Self-pay | Admitting: Internal Medicine

## 2020-04-13 DIAGNOSIS — R079 Chest pain, unspecified: Secondary | ICD-10-CM

## 2020-04-13 NOTE — Telephone Encounter (Signed)
Call made to pt    Discussed results    Echo   Conclusions: (click link below for full report)   1. Left Ventricle: Global systolic function: EF is estimated visually at 60% .   2. Left Ventricle: Regional systolic function: Wall motion: There are no regional wall motion abnormalities.   3. Tricuspid Valve: There is insufficient regurgitation to estimate RV      Stress test     Borderline abnormal exercise only stress test.   Decreased exercise tolerance for age-exercise time 2 minutes 37 seconds.   Duke score -2 , intermediate probability of CAD   Recommend stress test with imaging if clinical suspicion for CAD       Had a episode of chest pain today which resolve - denies worsening chest pain .     + bilateral leg pain when walking, denies exertional chest pain, sob, palpitations     Will need to have stress echo  Will also place new referral to Cardiology     Will be traveling to [place] 12/18 for 2 weeks     Advised pt to report to ED with any worsening chest pain, pt agreed to plan

## 2020-04-14 ENCOUNTER — Other Ambulatory Visit: Payer: Self-pay

## 2020-04-20 ENCOUNTER — Telehealth (HOSPITAL_BASED_OUTPATIENT_CLINIC_OR_DEPARTMENT_OTHER): Payer: Self-pay | Admitting: Internal Medicine

## 2020-04-20 NOTE — Telephone Encounter (Signed)
-----   Message from Tina [name] sent at 04/20/2020  9:33 AM EST -----  Regarding: RETURN PHONE CALL  Contact: [phone]  [name] [id number], 64 year old, female, Telephone Information:  Home Phone      [phone]  Work Phone      Not on file.  Mobile          [phone]      Patient's Preferred Pharmacy:     [facility] [place], [place]  Phone: [phone] Fax: [phone]      CONFIRMED TODAY: Yes    CALL BACK NUMBER: [phone]  Best time to call back:   Cell phone:   Other phone:    Available times:    Patient's language of care: [name]    Patient needs a [name] interpreter.    Patient's PCP: Eron Staat [name], APRN    Person calling on behalf of patient: Patient (self)    Calls today to speak to provider only, To speak to a provider about , cardiac issues she is having and test , a appointment was offered to patient for PCP and other provider patient declined , patient insisted I send message over to PCP and for her to call

## 2020-04-20 NOTE — Telephone Encounter (Signed)
Call made to pt     Voices concerns of having continuation of chest pain   Occurred this morning mild pressure pain of her chest . Denies increase in chest pain, syncope, palpitations, sob, chest tightness.     Is scheduled to see Cardiology 12/22   And have Echo 12/23    Will be traveling to [place] 12/18  Would like to have ECHO completed before her travels     [place] end, please call pt to reschedule ECho before 12/18

## 2020-04-22 NOTE — Telephone Encounter (Signed)
Patient called back. [facility] Cardiology department an was told that Tina [name] needs to send a message directly to Cardiologist, Dr. Ellan Lambert so patient can be seen sooner

## 2020-04-23 NOTE — Telephone Encounter (Signed)
Called patient back and gave information below. States that she will speak with her daughter and will decide if will change the appointment or if will change her travel plans.

## 2020-04-23 NOTE — Telephone Encounter (Signed)
Patient is calling again, is worrying about because she is travelling on 12/18

## 2020-04-23 NOTE — Telephone Encounter (Signed)
Hi team, please call her back  I am covering for PCP, this is not an emergency - she does have chest pain but she's already had an echo & stress test  Therefore, she can reschedule her trip to see cardiology as appropriately scheduled in 2 weeks - but I cannot request an urgent consult simply because of her travel plans.  My apologies, Scientist, forensic

## 2020-04-26 ENCOUNTER — Ambulatory Visit (HOSPITAL_BASED_OUTPATIENT_CLINIC_OR_DEPARTMENT_OTHER): Payer: Self-pay | Admitting: Obstetrics & Gynecology

## 2020-04-27 MED FILL — AMLODIPINE 5MG: 90 days supply | Qty: 90 | Fill #2

## 2020-04-27 MED FILL — FLUOXETINE 20MG CAPS: 90 days supply | Qty: 90 | Fill #2

## 2020-04-30 ENCOUNTER — Ambulatory Visit (HOSPITAL_BASED_OUTPATIENT_CLINIC_OR_DEPARTMENT_OTHER): Payer: No Typology Code available for payment source

## 2020-05-05 ENCOUNTER — Ambulatory Visit (HOSPITAL_BASED_OUTPATIENT_CLINIC_OR_DEPARTMENT_OTHER): Payer: Self-pay | Admitting: Internal Medicine

## 2020-05-06 ENCOUNTER — Other Ambulatory Visit (HOSPITAL_BASED_OUTPATIENT_CLINIC_OR_DEPARTMENT_OTHER): Payer: Self-pay

## 2020-06-07 ENCOUNTER — Encounter (HOSPITAL_BASED_OUTPATIENT_CLINIC_OR_DEPARTMENT_OTHER): Payer: Self-pay

## 2020-06-25 ENCOUNTER — Ambulatory Visit
Payer: No Typology Code available for payment source | Attending: Obstetrics & Gynecology | Admitting: Obstetrics & Gynecology

## 2020-06-25 ENCOUNTER — Other Ambulatory Visit: Payer: Self-pay

## 2020-06-25 VITALS — BP 130/71 | HR 77 | Ht 62.0 in | Wt 162.0 lb

## 2020-06-25 DIAGNOSIS — N9089 Other specified noninflammatory disorders of vulva and perineum: Secondary | ICD-10-CM | POA: Diagnosis present

## 2020-06-25 DIAGNOSIS — N950 Postmenopausal bleeding: Secondary | ICD-10-CM | POA: Insufficient documentation

## 2020-06-25 NOTE — Patient Instructions (Signed)
Patient Education      Index Spanish   Hysteroscopy     ________________________________________________________________________  KEY POINTS  · Hysteroscopy is a procedure for looking at the inside of the uterus, the muscular organ at the top of the vagina. It may be used to find the cause of problems such as heavy bleeding, to look for fibroids or polyps, or to treat problems such as adhesions, fibroids, or polyps.  · Ask your healthcare provider if you need to avoid taking any medicine or supplements before the procedure. Tell your healthcare provider if you have any food, medicine, or other allergies such as latex.  · Ask your healthcare provider how and when you will get your test results and when you can return to normal activities.  ________________________________________________________________________  What is a hysteroscopy?  Hysteroscopy is a procedure for looking at the inside of the uterus with a small lighted tube and camera. The uterus (womb) is the muscular organ at the top of the vagina. Babies develop in the uterus, and menstrual blood comes from the uterus.  Hysteroscopy is a procedure that in many cases can be done safely in your healthcare provider’s office.  When is it used?  This procedure may be done when:  · You have heavy or prolonged bleeding from the uterus that does not get better with other treatments. Your healthcare provider will use the scope to try to see what is causing the problem.  · You have an IUD and the string attached to it is not visible. An IUD is a small plastic or copper device inserted into the uterus to prevent pregnancy. Hysteroscopy may be done to remove the IUD if it’s not in the right position.  · You have had several miscarriages (losses of a pregnancy before 20 weeks). The procedure may be done to help find and treat causes of miscarriage.  · You have a polyp in your uterus. A polyp is a growth that may cause abnormal bleeding, problems getting pregnant or  carrying a baby to term, or it may become cancerous. You may need a hysteroscopy to remove the polyp.  · You have adhesions (bands of scar tissue) inside the uterus. This can cause parts of the wall of the uterus to stick together, causing pain, abnormal bleeding, or trouble carrying a pregnancy. Your healthcare provider may be able to use the scope and another tool to find and remove adhesions.  · You have a fibroid tumor in your uterus. A fibroid tumor is a growth of muscle tissue from the wall of the uterus. The fibroid may press on your bladder or rectum, cause bleeding or pain, or make it hard to get or stay pregnant.  · You have had trouble getting pregnant. Your healthcare provider will be able to examine your uterus and may be able to find a reason for infertility.  · Your healthcare provider wants to take a sample of tissue from the uterus (a biopsy) to check for cancer.  Ask your healthcare provider about your choices for treatment and the risks.  How do I prepare for this procedure?  · You may need to take a pregnancy test before the procedure.  · Make plans for your care and recovery after you have the procedure. Find someone to give you a ride home after the procedure. Allow for time to rest and try to find other people to help with your day-to-day tasks while you recover.  · Follow your healthcare provider's instructions about not   smoking before and after the procedure. Smokers may have more breathing problems during the procedure and heal more slowly. It is best to quit 6 to 8 weeks before surgery.  · You may or may not need to take your regular medicines the day of the procedure. Tell your healthcare provider about all medicines and supplements that you take. Some products may increase your risk of side effects. Ask your healthcare provider if you need to avoid taking any medicine or supplements before the procedure.  · Tell your healthcare provider if you have any food, medicine, or other allergies  such as latex.  · Your provider will tell you when to stop eating and drinking before the procedure. This helps to keep you from vomiting during the procedure.  · Follow any other instructions your healthcare provider gives you. Your provider may prescribe medicine such as antibiotics or pain medicine. Ask your provider if there are other medicines you should take before the procedure.  · Ask any questions you have before the procedure. You should understand what your healthcare provider is going to do. You have the right to make decisions about your healthcare and to give permission for any tests or procedures.  What happens during the procedure?  Hysteroscopy may be done in your healthcare provider's office, at a surgical center, or at the hospital.  You will be given local, regional, or general anesthesia to keep you from feeling pain. Local or regional anesthesia numbs part of your body while you stay awake. With local or regional anesthesia, you may also be given medicine to help you relax. General anesthesia relaxes your muscles and puts you into a deep sleep.  Your healthcare provider will guide the scope into your vagina, through the cervix, and into your uterus. Gas or fluid may be released through the scope to inflate your uterus. This helps your provider see inside the uterus better. Your provider may use small instruments to remove or get a sample of abnormal tissue.  The procedure may take as little as 30 minutes or as long as 2 hours or more, depending on what your healthcare provider does during the procedure.  What happens after the procedure?  You may stay in a recovery area for a short time before you go home. In rare cases, you may stay at the hospital overnight.  After the procedure you may have:  · Cramps for a few hours after the procedure  · Trouble urinating for the first few hours  · Watery or bloody discharge for 3 or 4 weeks  Ask your healthcare provider:  · How and when you will get your  test results  · How long it will take to recover  · If there are activities you should avoid and when you can return to normal activities  · How to take care of yourself at home  · What symptoms or problems you should watch for and what to do if you have them  Make sure you know when you should come back for a checkup. Keep all appointments for provider visits or tests.  What are the risks of this procedure?  Every procedure or treatment has risks. Some possible risks of this procedure include:  · You may have problems with anesthesia.  · You may have an infection or bleeding.  · Other parts of your body may be injured during the surgery.  · You may have an allergic reaction to the fluid used during the procedure.  Ask   your healthcare provider how these risks apply to you. Be sure to discuss any other questions or concerns that you may have.  Developed by [employer].  Adult Advisor 2019.4 published by [employer].  Last modified: 2018-02-04  Last reviewed: 2018-02-04  This content is reviewed periodically and is subject to change as new health information becomes available. The information is intended to inform and educate and is not a replacement for medical evaluation, advice, diagnosis or treatment by a healthcare professional.  References   Adult Advisor 2019.4 Index    © 2018 [employer] and/or one of its subsidiaries

## 2020-06-25 NOTE — Progress Notes (Addendum)
CC: Post-menopausal bleeding     HPI:   Patient reports 3 episodes of bleeding around June/July of 2021. She had a very small volume of dark red/blackish blood. She did not require more than a panty liner to contain the bleeding. The first episode lasted 2 days, then two weeks later she had another similar episode, then two weeks later she had a third episode. Over the last ~7 months she has not had any bleeding. Pt reports she went through menopause >10 years ago, and denies bleeding since then until these episodes (although chart review indicates patient reported intermittent bleeding over multiple years). She had mild suprapubic cramping abdominal pain associated with these episodes of bleeding.   Prior to onset of the first episode, she also had an acute onset of sharp R sided abdominal pain, and that has resolved completely. Patient is not currently sexually active, and has not been for over 5 years. She denies any abnormal vaginal discharge.     Pt also notes a "wart like lesion" that she noticed in her vulvar area. It is not painful.     Physical:   BP 130/71 (Site: LA)   Pulse 77   Ht 5\' 2"  (1.575 m)   Wt 73.5 kg (162 lb)   LMP 06/08/2008   BMI 29.63 kg/m   Small lesion on posterior L side of vulva. Not tender to palpation.     Imaging: Patient has had ultrasound and CT scan that were unremarkable (10/2019). 60mm endometrial stripe. Ovaries were difficult to visualize on u/s but appeared normal on CT. Endometrial biopsy in 10/2019 that showed a small endometrial polyp.     Assessment and Plan   65 yo female presents for evaluation of post-menopausal bleeding.  Given the complete resolution of patient's bleeding for the last 7 months, it is unlikely that malignancy is the cause of her bleeding, however, abnormal post-menopausal bleeding over years merits hysteroscopy for further work up. Endometrial hyperplasia is unlikely given thin endometrial stripe on ultrasound. Given patient's age and the description  of her bleeding vaginal atrophy is the most likely cause. Endometrial polyp, fibroid, and adenomyosis are unlikely to be the cause of her bleeding given that patient is post-menopausal. Ovaries appeared normal on CT scan, so it is unlikely there is an ovarian malignancy.     - Will schedule D&C hysteroscopy for evaluation with biopsy  - f/u cardiac stress test prior to surgical procedure  - Will send pathology of vulvar lesion during hysteroscopy     Hassel Neth, MS3      I personally performed the physical exam and medical decision making portion of this visit and was physically present to confirm and verify the documentation of history, physical exam and medical decision making documentation provided by medical student.   Reviewed chart history. Several episodes of post-menopausal bleeding. Most recent testing showed possible polyp. [place] notable for thin EMS but unable to fully eval endometrium. It has been 7 months since last bleeding episode. Low clinical suspicion for cancer. Reviewed w/ patient. Offered to complete eval for possible polyp and PMBx w/ D&C/hysteroscopy v. Wait and see and if she were to have further bleeding and if further bleeding we could do D&C. She ultimately elects for D&C now. She also has a vulvar lesion that she noticed a few weeks ago and has gotten smaller. She asked [place] to examine it.   Physical Exam  Genitourinary:            Genitourinary Comments:  She has a small area at the left posterior apex of the left labia majora there is a slightly raised and discolored area. Very subtle.      A/ (N95.0) Postmenopausal bleeding  (primary encounter diagnosis)  (N90.89) Vulvar lesion  Booking oprder placed for D&C/hysteroscopy.   Will plan to remove vulvar lesion if still present.   Will return for pre-op visit  Having SOB/chest pain and currently getting cards eval - will need medical clearance. I will message PCP.     Carmelina Paddock, MD

## 2020-07-06 ENCOUNTER — Telehealth (HOSPITAL_BASED_OUTPATIENT_CLINIC_OR_DEPARTMENT_OTHER): Payer: Self-pay | Admitting: Internal Medicine

## 2020-07-06 NOTE — Telephone Encounter (Signed)
Yes, I will have my staff call her to make a pre op exam with me prior to her surgery with you     Front end   Hello please call pt to schedule a pre op exam the first week of April     Thank you   Hoyle Sauer

## 2020-07-06 NOTE — Telephone Encounter (Signed)
Appt I scheduled for 08/17/2020, reminder also mailed.      [facility]  Call made to [facility] an appointmentPre-op . [place] present as interpreter in [name].   Patient (self) did not answer at [phone] ; this team member left a message asking for a call back.  Asked pt to call back to confirm appt.  I have completed the following communication and reminder steps: Patient notified by telephone  Marlou Starks, 07/06/2020    On behalf of [id number] [name], APRN

## 2020-07-13 NOTE — Telephone Encounter (Signed)
Patient called back/appt confirmed

## 2020-07-15 MED FILL — OMEPRAZOLE 20MG: 90 days supply | Qty: 90 | Fill #1 | Status: CP

## 2020-07-15 MED FILL — GEMFIBROZIL 600MG: 90 days supply | Qty: 180 | Fill #2 | Status: CP

## 2020-07-16 ENCOUNTER — Ambulatory Visit
Admission: RE | Admit: 2020-07-16 | Discharge: 2020-07-16 | Disposition: A | Discharge status: Home / Self Care | Payer: No Typology Code available for payment source | Attending: Family Medicine | Admitting: Family Medicine

## 2020-07-16 DIAGNOSIS — Z20822 Contact with and (suspected) exposure to covid-19: Secondary | ICD-10-CM | POA: Diagnosis present

## 2020-07-17 ENCOUNTER — Encounter (HOSPITAL_BASED_OUTPATIENT_CLINIC_OR_DEPARTMENT_OTHER): Payer: Self-pay | Admitting: Geriatrics

## 2020-07-17 LAB — COVID-19 OUTPATIENT: COVID-19 OUTPATIENT: NEGATIVE

## 2020-07-19 ENCOUNTER — Encounter (HOSPITAL_BASED_OUTPATIENT_CLINIC_OR_DEPARTMENT_OTHER): Payer: Self-pay

## 2020-07-23 MED FILL — FLUOXETINE 20MG CAPS: 90 days supply | Qty: 90 | Fill #3

## 2020-07-23 MED FILL — AMLODIPINE 5MG: 90 days supply | Qty: 90 | Fill #3

## 2020-08-10 ENCOUNTER — Emergency Department (HOSPITAL_BASED_OUTPATIENT_CLINIC_OR_DEPARTMENT_OTHER): Payer: No Typology Code available for payment source

## 2020-08-10 ENCOUNTER — Other Ambulatory Visit: Payer: Self-pay

## 2020-08-10 ENCOUNTER — Emergency Department
Admission: EM | Admit: 2020-08-10 | Discharge: 2020-08-10 | Disposition: A | Discharge status: Home / Self Care | Payer: No Typology Code available for payment source | Attending: Emergency Medicine | Admitting: Emergency Medicine

## 2020-08-10 ENCOUNTER — Encounter (HOSPITAL_BASED_OUTPATIENT_CLINIC_OR_DEPARTMENT_OTHER): Payer: Self-pay

## 2020-08-10 ENCOUNTER — Ambulatory Visit
Admission: RE | Admit: 2020-08-10 | Discharge: 2020-08-10 | Disposition: A | Discharge status: Home / Self Care | Payer: No Typology Code available for payment source | Attending: Internal Medicine | Admitting: Internal Medicine

## 2020-08-10 DIAGNOSIS — R55 Syncope and collapse: Secondary | ICD-10-CM | POA: Insufficient documentation

## 2020-08-10 DIAGNOSIS — R7989 Other specified abnormal findings of blood chemistry: Secondary | ICD-10-CM | POA: Diagnosis not present

## 2020-08-10 DIAGNOSIS — R079 Chest pain, unspecified: Secondary | ICD-10-CM | POA: Insufficient documentation

## 2020-08-10 DIAGNOSIS — R0902 Hypoxemia: Secondary | ICD-10-CM | POA: Diagnosis not present

## 2020-08-10 DIAGNOSIS — R9431 Abnormal electrocardiogram [ECG] [EKG]: Secondary | ICD-10-CM

## 2020-08-10 LAB — COMPREHENSIVE METABOLIC PANEL
ALANINE AMINOTRANSFERASE: 19 U/L (ref 12–45)
ALBUMIN: 3.6 g/dL (ref 3.4–5.0)
ALKALINE PHOSPHATASE: 96 U/L (ref 45–117)
ANION GAP: 9 mmol/L (ref 5–15)
ASPARTATE AMINOTRANSFERASE: 16 U/L (ref 8–34)
BILIRUBIN TOTAL: 0.2 mg/dL (ref 0.2–1.0)
BUN (UREA NITROGEN): 14 mg/dL (ref 7–18)
CALCIUM: 8.5 mg/dL (ref 8.5–10.1)
CARBON DIOXIDE: 24 mmol/L (ref 21–32)
CHLORIDE: 108 mmol/L — ABNORMAL HIGH (ref 98–107)
CREATININE: 0.8 mg/dL (ref 0.4–1.2)
ESTIMATED GLOMERULAR FILT RATE: >60 mL/min (ref >60)
Glucose Random: 90 mg/dL (ref 74–160)
POTASSIUM: 4.2 mmol/L (ref 3.5–5.1)
SODIUM: 141 mmol/L (ref 136–145)
TOTAL PROTEIN: 6.9 g/dL (ref 6.4–8.2)

## 2020-08-10 LAB — POC URINALYSIS
BILIRUBIN, URINE: NEGATIVE
GLUCOSE,URINE: NEGATIVE
KETONE, URINE: NEGATIVE
LEUKOCYTE ESTERASE: NEGATIVE
NITRITE, URINE: NEGATIVE
OCCULT BLOOD, URINE: NEGATIVE
PH URINE: 7 (ref 5.0–8.0)
PROTEIN, URINE: NEGATIVE
SPECIFIC GRAVITY, URINE: 1.015 (ref 1.003–1.030)
UROBILINOGEN URINE: 0.2 (ref 0.2–1.0)

## 2020-08-10 LAB — CBC, PLATELET & DIFFERENTIAL
ABSOLUTE BASO COUNT: 0 10*3/uL (ref 0.0–0.1)
ABSOLUTE EOSINOPHIL COUNT: 0.1 10*3/uL (ref 0.0–0.8)
ABSOLUTE IMM GRAN COUNT: 0.01 10*3/uL (ref 0.00–0.03)
ABSOLUTE LYMPH COUNT: 2.6 10*3/uL (ref 0.6–5.9)
ABSOLUTE MONO COUNT: 0.6 10*3/uL (ref 0.2–1.4)
ABSOLUTE NEUTROPHIL COUNT: 2.8 10*3/uL (ref 1.6–8.3)
ABSOLUTE NRBC COUNT: 0 10*3/uL (ref 0.0–0.0)
BASOPHIL %: 0.7 % (ref 0.0–1.2)
EOSINOPHIL %: 2.1 % (ref 0.0–7.0)
HEMATOCRIT: 38.5 % (ref 34.1–44.9)
HEMOGLOBIN: 12.7 g/dL (ref 11.2–15.7)
IMMATURE GRANULOCYTE %: 0.2 % (ref 0.0–0.4)
LYMPHOCYTE %: 41.5 % (ref 15.0–54.0)
MEAN CORP HGB CONC: 33 g/dL (ref 31.0–37.0)
MEAN CORPUSCULAR HGB: 31.1 pg (ref 26.0–34.0)
MEAN CORPUSCULAR VOL: 94.4 fl (ref 80.0–100.0)
MEAN PLATELET VOLUME: 12 fL (ref 8.7–12.5)
MONOCYTE %: 9.9 % (ref 4.0–13.0)
NEUTROPHIL %: 45.6 % (ref 40.0–75.0)
NRBC %: 0 % (ref 0.0–0.0)
PLATELET COUNT: 237 10*3/uL (ref 150–400)
RBC DISTRIBUTION WIDTH STD DEV: 42.5 fL (ref 35.1–46.3)
RED BLOOD CELL COUNT: 4.08 M/uL (ref 3.90–5.20)
WHITE BLOOD CELL COUNT: 6.2 10*3/uL (ref 4.0–11.0)

## 2020-08-10 LAB — URINALYSIS RFLX TO URINE CULT
BILIRUBIN, URINE: NEGATIVE
GLUCOSE, URINE: NEGATIVE MG/DL
KETONE, URINE: NEGATIVE MG/DL
LEUKOCYTE ESTERASE: NEGATIVE
NITRITE, URINE: NEGATIVE
OCCULT BLOOD, URINE: NEGATIVE
PH URINE: 7 (ref 5.0–8.0)
PROTEIN, URINE: NEGATIVE MG/DL
SPECIFIC GRAVITY URINE: 1.01 (ref 1.003–1.035)

## 2020-08-10 LAB — D-DIMER PE/DVT, QUANTITATIVE: D-DIMER PE/DVT, QUANTITATIVE: 724 ng/mLFEU (ref 0.00–499)

## 2020-08-10 LAB — BLOOD SUGAR FINGERSTICK (POINT OF CARE): FINGERSTICK GLUCOSE: 79 mg/dl (ref 74–160)

## 2020-08-10 LAB — TROPONIN I: TROPONIN I: <0.02 ng/mL (ref 0.00–0.04)

## 2020-08-10 MED ORDER — IOHEXOL 350 MG/ML IV SOLN
80.00 mL | Freq: Once | INTRAVENOUS | Status: AC
Start: 2020-08-10 — End: 2020-08-10
  Administered 2020-08-10: 80 mL via INTRAVENOUS

## 2020-08-10 MED ORDER — PERFLUTREN DILUTED IV BOLUS
1.0000 mL | Status: DC
Start: 2020-08-10 — End: 2020-08-11
  Administered 2020-08-10: 2 mL via INTRAVENOUS

## 2020-08-10 MED ORDER — PERFLUTREN PROTEIN A MICROSPH IV SUSP
INTRAVENOUS | Status: AC
Start: 2020-08-10 — End: 2020-08-10
  Administered 2020-08-10: 3 mL via INTRAVENOUS
  Filled 2020-08-10: qty 3

## 2020-08-10 MED ORDER — NORMAL SALINE FLUSH 0.9 % IV SOLN
100.00 mL | Freq: Once | INTRAVENOUS | Status: AC
Start: 2020-08-10 — End: 2020-08-10
  Administered 2020-08-10: 100 mL via INTRAVENOUS

## 2020-08-10 MED ORDER — SODIUM CHLORIDE 0.9 % IV BOLUS
1000.0000 mL | Freq: Once | INTRAVENOUS | Status: AC
Start: 2020-08-10 — End: 2020-08-10
  Administered 2020-08-10: 1000 mL via INTRAVENOUS

## 2020-08-10 NOTE — Narrator Note (Signed)
Blood work sent to lab.

## 2020-08-10 NOTE — Narrator Note (Signed)
Pt transported via stretcher to [place]

## 2020-08-10 NOTE — ED Provider Notes (Signed)
The patient was seen primarily by me. ED nursing record was reviewed.    History, physical exam, and disposition planning were conducted with an official hospital [name] interpreter.         HPI:    Tina [name] is a 65 year old female patient with history of hypertension, hyperlipidemia who presents for syncopal event shortly after undergoing exercise stress test.  Event information provided by cardiology PA, Jenny Reichmann, as well as cardiology nurse, Dub Mikes.  Patient was in cardiology stress test suite, had just completed undergoing treadmill/exercise stress test when she was accompanied to the stretcher and laid down.  The nurse proceeded to ask her questions and patient seemingly went unresponsive, though she continued to breathe and her eyes were open.  Cardiac nurse denies any convulsions.  She did not sustain any trauma.  The patient was unresponsive for a total of less than 1 minute, and after the nurse return from the room to call a rapid response, the patient was awake and answering questions, returned to baseline shortly after.  EKG tracings were reviewed and patient completed the stress test with no events.  She remained hemodynamically stable throughout the entire stress test and peri-stress test time.    On arrival to the emergency department, patient denies any complaints and states "I am fine, I can go home".  Patient reports feeling generally weak prior to syncopizing and but denies any chest pain or shortness of breath.  No known head trauma.  She currently feels completely fine and has no symptoms including chest pain, shortness of breath or weakness.  Denies any focal numbness or weakness.  She reports several history of syncopal events in the past with no obvious cause.  Patient asking to go home at this time.  Denies drug or alcohol use.    ROS: Pertinent positives were reviewed as per the HPI above. All other systems were reviewed and are negative.  Macky Lower  Language of care:  [name] Derwood Kaplan)  MRN: [id number]  PCP: [name], APRN  Mode of arrival to ED: Stranger.  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                No signs of infection on the right. Sent for hearing                test.   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 Marital status: Married     Spouse name: [name]   . Number of children: 4   . Years of education: Not on file   . Highest education level: Not on file   Occupational History   . Occupation: Optician, dispensing: SELF EMPL[employer]     Comment: Works daytime   Tobacco Use   . Smoking status: Former Research scientist (life sciences)   . Smokeless tobacco: Never Used   Substance and Sexual Activity   . Alcohol use: Yes      Comment: Occasionally   . Drug use: No   . Sexual activity: Yes     Partners: Male     Birth control/protection: Surgical   Other Topics Concern   . Not on file   Social History Narrative    Immigrated from [place] in 2003   Social Determinants of Health  Financial Resource Strain: Not on file  Food Insecurity: Not on file  Transportation Needs: Not on file  Physical Activity: Not on file  Stress: Not on file  Social Connections: Not on file  Intimate Partner Violence: Not on file  Housing Stability: Not on file     Allergies: Review of Patient's Allergies indicates:   Pravachol               Nausea Only, Other (See Comments)    Comment:Malaise   Simvastatin             Palpitations    Comment:"glottis swollen" in past, but says allergy             tested, not allergic to this med per pt 8/19    Immunizations:   Immunization History   Administered Date(s) Administered   . Covid-19 Vaccine [employer] - Purple [employer]) 08/11/2019, 09/01/2019, 04/09/2020   . Depo Provera 01/02/2005, 04/24/2008, 07/10/2008, 09/04/2008, 10/16/2008, 11/13/2008   . H1N1 0.84ml W/ Preserv 3 & > 07/10/2008   . HEP B ADULT 3 DOSE 20 and > 04/09/2015, 05/07/2015, 07/24/2016   . INFLUENZA VIRUS TRI W/PRESV VACCINE 18/> YRS IM (PRIVATE) 03/19/2009, 01/28/2010, 03/22/2011, 03/12/2012   . Influenza Virus Quad Presv Free Vacc 6 Mo and Older, IM 05/27/2014, 02/06/2017, 05/24/2018   . Influenza Virus Quad W/Presv Vacc 6 Mo and Older, IM 03/13/2013, 07/24/2016   . Td 04/20/2003   . Tdap 01/13/2014          Medications:  Prior to Admission Medications   Prescriptions Last Dose Informant Patient Reported? Taking?   Cholecalciferol (VITAMIN D3) 1000 units TABS Tablet   No No   Sig: TAKE ONE TABLET BY MOUTH DAILY   EPINEPHrine 0.3 MG/0.3ML auto-injector   No No   Sig: Inject 0.3 mg into the muscle once as needed for up to 1 dose   FLUoxetine (PROZAC) 20 MG capsule   No No   Sig: Take 1 capsule by mouth daily   amLODIPine (  NORVASC) 5 MG tablet   No No   Sig: Take 1  tablet by mouth daily   gemfibrozil (LOPID) 600 MG tablet   No No   Sig: Take 1 tablet by mouth 2 (two) times daily before meals   meloxicam (MOBIC) 15 MG tablet   No No   Sig: Take 1 tablet by mouth daily   omeprazole (PRILOSEC) 20 MG capsule   No No   Sig: Take 1 capsule by mouth daily      Facility-Administered Medications: None     Physical Exam (ED Bed 25/25-A):   Patient Vitals for the past 999 hrs:   BP Temp Pulse Resp SpO2 Weight   08/10/20 1514 -- -- 68 19 97 % --   08/10/20 1504 -- -- 63 17 97 % --   08/10/20 1401 -- -- 85 -- -- --   08/10/20 1356 140/90 97.1 F 85 16 100 % 72.6 kg (160 lb)     GENERAL:  Well appearing, no acute distress, non-toxic   SKIN:  Warm & Dry, no rash  HEAD:  Normocephalic, atraumatic, PERRL. EOMI.   LUNGS:  Easy, non-labored breathing. Speaking in full sentences without difficulty. Lungs are clear to auscultation bilaterally. No wheezes, rales, rhonchi.   HEART:  RRR.  No murmurs.   ABDOMEN:  Soft, Non-tender, non-distended.  No masses.  No involuntary guarding or rebound.   EXTREMITIES:  No obvious deformities. No edema.   NEUROLOGIC:  Alert; moves all extremities; speaking in clear fluent sentences. Normal gait without ataxia. CNs III-XII symmetrical and intact. Sensation intact to light touch throughout. 5/5 strength globally.  PSYCHIATRIC:  Appropriate for age, time of day, and situation    Medications Given in the ED:    Medications   sodium chloride 0.9 % IV bolus 1,000 mL (0 mLs Intravenous Stopped 08/10/20 1659)   iohexol (OMNIPAQUE) 350 MG/ML injection 80 mL (80 mLs Intravenous Given 08/10/20 1548)   sodium chloride 0.9 % flush 100 mL (100 mLs Intravenous Given 08/10/20 1548)    Radiology Results:     Lab Results:     Labs Reviewed   COMPREHENSIVE METABOLIC PANEL - Abnormal; Notable for the following components:       Result Value    CHLORIDE 108 (*)     All other components within normal limits   D-DIMER PE/DVT, QUANTITATIVE - Abnormal; Notable for the following  components:    D-DIMER PE/DVT, QUANTITATIVE 724 (*)     All other components within normal limits    Narrative:     Well's Risk Category:->Moderate (5-6)  Current Anticoagulant->None  Is this D-Dimer needed to rule out Pulmonary Embolism or  DVT?->Yes   BLOOD SUGAR FINGERSTICK (POINT OF CARE)   CBC, PLATELET & DIFFERENTIAL    Narrative:     Well's Risk Category:->Moderate (5-6)  Current Anticoagulant->None  Is this D-Dimer needed to rule out Pulmonary Embolism or  DVT?->Yes   TROPONIN I   URINALYSIS RFLX TO URINE CULT    Narrative:     UCV&Urine, Clean Void   POC URINALYSIS        Cardiac Stress Test: ECHO (Imaging)  Conclusions: (click link below for full report)  1. Bruce Protocol to 3:24 minutes and 5 METS, achieving 158 BPM = 100% max predicted. Superb double product of 33, but 30% diminished exercise capacity, stopping for dyspnea.  2. No chest pain occurred.  3. No ST changes developed.  4. LV function was normal by echo at rest  and with exertion, with no regional wall motion abnormalities.  5. No evidence of inducible ischemia.  6. Upon the patient's transferring from the treadmill to the imaging table, she because transiently unresponsive. The rhythm was sinus tach with no ectopy, BP was normal, and O2 sat was 98%. A "Rapid Response" was initiated. She became responsive after approximately one minute, and was transferred to the ED for further evaluation.  CT Chest W Contrast PE Protocol  INDICATION: 65 years-old Female presents with Pulmonary embolism (PE) suspected, positive D-dimer    COMPARISON: Chest radiographs 08/10/2020 and 03/09/2020    TECHNIQUE: CT thorax for pulmonary embolism with multiplanar reformats  IV Contrast: Omnipaque 350   Radiation Dose: Radiation dose reduction techniques were employed. CTDIvol: 4.1 - 21.8 mGy. DLP: 658 mGy-cm.    FINDINGS:    Diagnostic Quality: Suboptimal evaluation of the peripheral pulmonary arteries due to respiratory motion artifact.      Pulmonary arteries:  There are no central pulmonary emboli. Assessment of the peripheral pulmonary arteries is suboptimal. The main pulmonary is nondilated.    Aorta: Mild atherosclerosis. The aorta is nondilated.     Heart: Cardiac size is normal. There are mild coronary artery calcifications. There is no pericardial effusion.     Lungs: Evaluation degraded by respiratory motion artifact. Mild biapical scarring. Minimal dependent changes in the right lower lobe posteriorly. Minimal scattered linear scarring versus subsegmental atelectasis. Left lower lobe 3 mm calcified granuloma (series 6 image 166).    Pleura: No pleural effusion or pneumothorax.     Airways: Grossly patent.    Mediastinum/Lymph nodes: No enlarged mediastinal or hilar lymph nodes.      Thyroid: Visualized portions unremarkable.    Chest wall: Unremarkable.     Bones: Generalized osteopenia. Degenerative changes.    Upper abdomen: Unremarkable.    IMPRESSION:    Pulmonary embolus protocol CT scan of the chest is degraded by respiratory motion artifact and reveals:  1.  No central pulmonary emboli.  2.  Mild atherosclerosis, including mild CAD.          Reviewed and Electronically Signed by: Ralene Bathe MD   Signed Date/Time: 08-10-2020 16:15:37         XR Chest 2 views  TECHNIQUE: Chest, 2 views    INDICATION: Syncope, hypoxia    COMPARISON: Chest radiographs 03/09/2020    FINDINGS:    Lungs: There is no consolidation or pulmonary edema.     Pleura: There is no pleural effusion or pneumothorax.    Heart: The cardiac silhouette is unremarkable.     Mediastinum/hila: Unremarkable    Bones and Soft Tissues: There are degenerative changes of the thoracic spine.    IMPRESSION:    No acute cardiopulmonary findings on chest radiographs.          Reviewed and Electronically Signed by: Raeford Razor MD   Signed Date/Time: 08-10-2020 14:47:22                EKG: Sinus Rhythm. 78 bpm. Normal Axis. No PR Prolongation. QTc 462. No T wave inversion of ST Segment changes  suggestive of ischemia.     ED Course and Medical Decision-making:  Jeni Duling is a 65 year old female patient with history of hypertension, hyperlipidemia who presents for syncopal event shortly after undergoing exercise stress test.  Exact etiology for her episode of unresponsiveness is unclear.  Unlikely cardiac in nature given she had just completed a normal stress test with normal EKG testing, did not  have any chest pain or shortness of breath, and subsequent EKG and troponin testing here shows no evidence of ischemia or arrhythmia.  Her exam is otherwise reassuring.  She has no focal neurologic deficits and her lack of convulsions and quick return to baseline with no postictal period would suggest against acute seizure.  No head trauma and given the normal neurologic exam, do not feel CT head is necessary at this time.  All of her lab work is reassuring.  The D-dimer did come back slightly elevated however subsequent CT PE study was negative for any blood clot or other acute intrathoracic pathology.  She remained asymptomatic, alert and oriented throughout her entire stay in the emergency department.  Consider possibility of exercise-induced vasodilation as cause for brief, resolved altered level of consciousness.  At this time, I do feel she is stable for discharge with close PCP follow-up and strict return precautions, which were discussed with her at bedside.         Patient/family educated on diagnosis(es); she states understanding and agreement with plan of care.  Reasons to return to the ED were reviewed in detail. She agrees with this plan and disposition.    Disposition: Discharge    Condition on Discharge: Improved and Stable    Diagnosis/Diagnoses:  Syncope, unspecified syncope type    Discharge Prescriptions:      Medication List      ASK your doctor about these medications    amLODIPine 5 MG tablet  Commonly known as: NORVASC  Take 1 tablet by mouth daily     EPINEPHrine 0.3 MG/0.3ML  injection  Commonly known as: EPIPEN  Inject 0.3 mg into the muscle once as needed for up to 1 dose     FLUoxetine 20 MG capsule  Commonly known as: PROzac  Take 1 capsule by mouth daily     gemfibrozil 600 MG tablet  Commonly known as: LOPID  Take 1 tablet by mouth 2 (two) times daily before meals     meloxicam 15 MG tablet  Commonly known as: MOBIC  Take 1 tablet by mouth daily     omeprazole 20 MG capsule  Commonly known as: PriLOSEC  Take 1 capsule by mouth daily     Vitamin D3 25 MCG (1000 UT) Tabs Tablet  TAKE ONE TABLET BY MOUTH DAILY            Cori Razor, MD, MPH  Attending Physician  Emergency [place]  This Emergency Department patient encounter note was created using voice-recognition software and in real time during the ED visit.

## 2020-08-10 NOTE — Discharge Instructions (Addendum)
Ms. Shill, you are seen in the emergency department today for an episode of unresponsiveness after your stress test.  Your stress test did not show any cardiac cause for your episode, and your neurologic exam was normal.  All of your testing in the ER, including an EKG (heart tracing), labs, CT scan (for blood clots in your lung) were all normal and did not reveal a cause for your symptoms.  At this time, we recommend you follow-up with your primary care doctor and if at anytime you develop any further episodes of chest pain, shortness of breath or episodes of decreased responsiveness, come back to the emergency department immediately    Sra. Boothe, voc  vista no departamento de emergncia hoje por um episdio de falta de resposta aps seu teste de estresse. Seu teste de estresse no mostrou nenhuma causa cardaca para seu episdio e seu exame neurolgico foi normal. Todos os seus testes no pronto-socorro, incluindo um eletrocardiograma (rastreamento cardaco), laboratrios, tomografia computadorizada (para cogulos sanguneos no pulmo) foram todos normais e no revelaram uma causa para seus sintomas. Neste momento, recomendamos que voc faa o acompanhamento com seu mdico de cuidados primrios e, se a qualquer momento desenvolver mais episdios de dor no peito, falta de ar ou episdios de diminuio da capacidade de resposta, volte ao pronto-socorro imediatamente

## 2020-08-10 NOTE — Progress Notes (Signed)
Pt. Arrived to stress lab.  Test explained and consent obtained.   Brief history  and assessment obtained HR 73  BP 126/70  spO2  97%.  Lungs clear upon assessment.  IV started with no complications.  Echo images obtained.   Treadmill test started and continued w/o complications, test ended d/t dyspnea / 100%MPHR achieved.  Echo images obtained   Patient was unable to answer questions about pain or how she was doing.  Rapid response was called.  Was exhibiting severe shortness of breath and after a few minutes responded  to questions of name and birthday.  Peak vitals 210/90 mmHg HR 156bpm 02: 100%  Placed on 6L nasal canula.    Rapid response team took her to ED via stretcher.    Hessie Knows, RN, 08/10/2020

## 2020-08-10 NOTE — ED Triage Note (Signed)
Pt presents to the ED after experiencing leg pain and fatigue while on the treadmill doing a cardiac stress test.   Pt states she had bilateral leg pain that has now improved, minimal pain in left leg reported by patient.  Pt reports a history of HTN  Pt currently denies dizziness, CP, or nausea.  Pt placed on cardiac monitor

## 2020-08-10 NOTE — Narrator Note (Signed)
Patient Disposition  Patient education for diagnosis, medications, activity, diet and follow-up.  Patient left ED 5:05 PM.  Patient rep received written instructions.    Interpreter to provide instructions: Yes; Interpreter ID: [email or web address]    Patient belongings with patient: YES    Have all existing LDAs been addressed? Yes    Have all IV infusions been stopped? N/A    Destination: Discharged to home. Pt verbalizes understanding of all discharge instructions.

## 2020-08-12 ENCOUNTER — Ambulatory Visit: Payer: No Typology Code available for payment source | Attending: Internal Medicine | Admitting: Internal Medicine

## 2020-08-12 DIAGNOSIS — I10 Essential (primary) hypertension: Secondary | ICD-10-CM | POA: Insufficient documentation

## 2020-08-12 DIAGNOSIS — R079 Chest pain, unspecified: Secondary | ICD-10-CM | POA: Diagnosis present

## 2020-08-12 DIAGNOSIS — R0609 Other forms of dyspnea: Secondary | ICD-10-CM

## 2020-08-12 DIAGNOSIS — R55 Syncope and collapse: Secondary | ICD-10-CM | POA: Diagnosis present

## 2020-08-12 DIAGNOSIS — R0600 Dyspnea, unspecified: Secondary | ICD-10-CM | POA: Diagnosis present

## 2020-08-12 DIAGNOSIS — E7800 Pure hypercholesterolemia, unspecified: Secondary | ICD-10-CM | POA: Insufficient documentation

## 2020-08-12 DIAGNOSIS — R0789 Other chest pain: Secondary | ICD-10-CM

## 2020-08-12 NOTE — Progress Notes (Signed)
Cardiology Clinic Consult Note  Date of visit:  08/13/2020      Tina [name] is a 65 year old female referred for consultation by [name], APRN for chest pain/DOE.    65 years old F with HLP (patient is intolerant to Pravachol  And Simvastatin due to nausea/stomach pain) and HTN ( amlodipine, 5 mg).  Home SBP in the 130 mm Hg.    Patient reports that for the last 6 months she has symptoms of exertional chest pain and DOE when she walks up the hill.  She denies any symptoms walking on flat ground.  She reports that she gets gets easily tired when climbing stairs.  Patient denies orthopnea, PND, leg edema.    She was seen in ED 03/09/2020 for chest pain and SOB.  EKG was nonischemic, trop was < 0.02.  Patient was discharged home.  On March 31, 2020 patient underwent an exercise stress test which showed very poor exercise tolerance.  She stopped after 2 minutes and 37 seconds of exercise due to dyspnea.  Her peak blood pressure was 190/96.  Test was borderline positive by EKG changes.  Subsequently, patient underwent a 2D echo on 04/07/2020 which came back unremarkable and a stress echo on August 10, 2020.     Patient walked on the treadmill for 3.4 minutes, she achieved 100% of target heart rate (peak heart rate was 158 bpm) and again she developed hypertensive blood pressure response to exercise, peak BP  of 210/90 mm Hg (preexercise blood pressure was normal).  Patient did not have any chest pain on the treadmill however she experienced dyspnea.  Functional capacity diminished for age and sex- 30% under as expected for age and gender.  There were no EKG changes and the peak stress echocardiographic images although of  limited quality, did not show any obvious evidence of ischemia.      Interestingly, after she transferred from the treadmill to the imaging table, patient became "unresponsive" and a rapid response was called.  She was still connected to the EKG and the rhythm was sinus  tachycardia without any ectopies; blood pressure was normal, oxygen saturation was normal, patient was breathing on her own per stress RN report. Patient became responsive after 1 minute and she was transferred to the emergency department for further evaluation.  A chest CT was done which showed no evidence of PE.    Patient does not recall that episodes.  She states she was having severe leg pain after walking on the treadmill (especially the left leg,  the inner thig )and she lost consciousness.  She denies any chest pain prior to the episode.      General review of system:  + leg pain on walking, she continues to have left leg inner thigh  pain since the stress test  + dizziness when walking     + History of fainting  since age 58 and  Also during pregnancies, no DM, pre-eclampsia during pregnancy    - home SBP 130s    All other systems were reviewed and negative in detail      History of present illness:    Patient Active Problem List:     Multiple Breast Cysts     Menorrhagia     Hypertension     Hyperlipidemia LDL goal < 130     Hot flashes not due to menopause     Tendonitis     Sinusitis     Tension headache, chronic  Routine general medical examination at a health care facility     Atrial ectopy     Vitamin D deficiency     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     Chronic left hip pain     Postmenopausal bleeding     Vulvar lesion      CURRENT MEDICATIONS  omeprazole (PRILOSEC) 20 MG capsule, Take 1 capsule by mouth daily, Disp: 90 capsule, Rfl: 3  amLODIPine (NORVASC) 5 MG tablet, Take 1 tablet by mouth daily, Disp: 90 tablet, Rfl: 3  gemfibrozil (LOPID) 600 MG tablet, Take 1 tablet by mouth 2 (two) times daily before meals, Disp: 180 tablet, Rfl: 3  FLUoxetine (PROZAC) 20 MG capsule, Take 1 capsule by mouth daily, Disp: 90 capsule, Rfl: 3  meloxicam (MOBIC) 15 MG tablet, Take 1 tablet by mouth daily, Disp: 30 tablet, Rfl: 2  Cholecalciferol (VITAMIN D3) 1000 units TABS Tablet, TAKE ONE TABLET BY MOUTH  DAILY, Disp: 30 tablet, Rfl: 11  EPINEPHrine 0.3 MG/0.3ML auto-injector, Inject 0.3 mg into the muscle once as needed for up to 1 dose, Disp: 2 each, Rfl: 1    sodium chloride 0.9 % flush 10-100 mL, 10-100 mL, Intra-articular, Once in imaging, Frances Maywood, MD        No orders of the defined types were placed in this encounter.      Review of Patient's Allergies indicates:   Pravachol               Nausea Only, Other (See Comments)    Comment:Malaise   Simvastatin             Palpitations    Comment:"glottis swollen" in past, but says allergy             tested, not allergic to this med per pt 8/19    SOCIAL & FAMILY HISTORY:   - from [place]  -stopped smoking many years ago- smoked 1-4 cigarettes/day      FH: 2 paternal  uncles dies of MI at home  and 1 brother died of heart ds  Father had  CABG surgery  at age 30 and died 2 hours after  CABG  Bother had MI, also died 72 hours after CABG - in his 28s.      ROS: All other systems reviewed were pertinently negative apart from the history of present illness.        PERTINENT INVESTIGATIONS:  1. EKG: (on my personal review) August 11, 2019: Normal sinus rhythm, heart rate 79 bpm, normal EKG.  2. LABS:   Lab Results   Component Value Date    NA 141 08/10/2020    K 4.2 08/10/2020    CL 108 (H) 08/10/2020    CO2 24 08/10/2020    BUN 14 08/10/2020    CREAT 0.8 08/10/2020    GLUCOSER 90 08/10/2020     Lab Results   Component Value Date    LDL 159 04/26/2018    HDL 58 04/26/2018    TG 116 04/26/2018       No results found for: PROBNP  No results found for: BNP    Chest CT 08/11/2019    Pulmonary embolus protocol CT scan of the chest is degraded by respiratory motion artifact and reveals:   1. No central pulmonary emboli.   2. Mild atherosclerosis, including mild CAD. Heart: Cardiac size is normal. There are mild coronary artery calcifications. There is no pericardial effusion.  3. ECHO: 04/07/2020    Conclusions: (click link below for full report)   1. Left Ventricle:  Global systolic function: EF is estimated visually at 60% .   2. Left Ventricle: Regional systolic function: Wall motion: There are no regional wall motion abnormalities.   3. Tricuspid Valve: There is insufficient regurgitation to estimate RVSP.        4. STRESS TEST:08/10/2020     1. Stress Parameter: Bruce Protocol. Duration 3.4 min. Baseline HR 74 bpm. Peak HR 158 bpm. METs achieved: 5.48. % Max HR 100.0 %.   2. Bruce Protocol to 3:24 minutes and 5 METS, achieving 158 BPM = 100% max predicted. Superb double product of 33, but 30% diminished exercise capacity, stopping for dyspnea.   3. No chest pain occurred.   4. No ST changes developed.   5. LV function was normal by echo at rest and with exertion, with no regional wall motion abnormalities.   6. No evidence of inducible ischemia.   7. Upon the patient's transferring from the treadmill to the imaging table, she because transiently unresponsive. The rhythm was sinus tach with no ectopy, BP was normal, and O2 sat was 98%. A "Rapid Response" was initiated. She became responsive after approximately one minute, and was transferred to the ED for further evaluation    Chest CTA: 08/10/2020    Pulmonary embolus protocol CT scan of the chest is degraded by respiratory motion artifact and reveals:   1. No central pulmonary emboli.   2. Mild atherosclerosis, including mild CAD.     Lungs: Evaluation degraded by respiratory motion artifact. Mild biapical scarring. Minimal dependent changes in the right lower lobe posteriorly. Minimal scattered linear scarring versus subsegmental atelectasis. Left lower lobe 3 mm calcified granuloma (series 6 image 166).      2012: ETT  1.  Functional capacity (NEJM 2005:353: 468) is normal for age and sex.   2.  Exercise EKG ST segment response:  negative for ischemia   3.  Duke ETT score = 7. Using the Duke Score there is a low probability of   significant angiographic coronary disease.  ASSESSMENT:    PLAN:    (R07.9) Exertional  chest pain  (R06.00) DOE  Comment:   -Patient reports 6 months of exertional chest pain particularly with stair climbing and worsening exercise tolerance with dyspnea  -Her exercise tolerance has subjectively decreased: In 2012, she performed an ETT and she exercised for 7 minutes on the treadmill  -Currently her exercise tolerance is < 4 minutes (recent ETT/ stress echo with decreased exercise tolerance)    -Low ferritin level in 2006->2009 (ferritin level < 10); ?  Patient is exercise intolerant due to iron deficiency although she is not anemic, hemoglobin 12.7 08/10/2020    -?  Shortness of breath and chest pain due to exertional hypertension (her BP which is normal at rest rose >200/100 mm Hg within few minutes of exercise)  -?  Exercise-induced pulmonary hypertension (unable to assess PASP at rest on echo  Plan:   -Complete PFTs  -Recommend coronary CTA to rule out CAD (stress echocardiogram was of limited quality)  -I have asked PCP to check  iron studies  -Trial of metoprolol XL 25 mg daily    (R55) Syncope and collapse  Comment:   -Patient reports a history of fainting when she was younger during her pregnancies  -Recently she had 1 episode of loss of consciousness after she exercised on the treadmill ( 3/29); per witness report,  patient was breathing on her own however she states she does not remember that episode, she only remembers intense left leg pain after exercising; no arrhythmia on the EKG (she was still connected to the EKG machine since the episode occurred immediately after the stress test)  Plan:   -Monitor clinically  -Recommend coronary CTA    (I10) Essential hypertension  Comment:   -Reports normal blood pressure at rest-> she takes amlodipine 5 mg daily  -She had evidence of high blood pressure with exercise during ETT and stress echo  Plan:   -Increase amlodipine to 5 mg twice daily    (E78.00) Pure hypercholesterolemia  Comment:   The 10-year ASCVD risk score Mikey Bussing DC Jr., et al., 2013) is:  7.2%    Values used to calculate the score:      Age: 26 years      Sex: Female      Is Non-Hispanic African American: No      Diabetic: No      Tobacco smoker: No      Systolic Blood Pressure: 583 mmHg      Is BP treated: Yes      HDL Cholesterol: 58 mg/dL      Total Cholesterol: 244 mg/dL    Plan:   - patient states that she is intolerant to statin- could not tolerate Pravastatin and Simvastatin  - takes Gemfibrozil  -  PCP should add Zetia 10 mg daily      Return for follow up in Cardiology Clinic in  months. To contact earlier if there are any cardiac related issues. I have spent 45 minutes in face to face time with this patient of which more than 50% was spent in counseling and/or coordination of care regarding above issues and diagnosis.    Thank you for the privilege of involving me in this patient's care.  Please feel free to contact me if you have any questions.    This Cardiology Division patient encounter note was created using voice-recognition software and in real time during the clinic visit. Please excuse any typographical errors that have not been edited out.       Electronically signed by: Clinton Quant, MD, 08/13/2020 1:04 PM

## 2020-08-13 ENCOUNTER — Encounter (HOSPITAL_BASED_OUTPATIENT_CLINIC_OR_DEPARTMENT_OTHER): Payer: Self-pay | Admitting: Internal Medicine

## 2020-08-13 ENCOUNTER — Encounter (HOSPITAL_BASED_OUTPATIENT_CLINIC_OR_DEPARTMENT_OTHER): Payer: Self-pay | Admitting: Licensed Practical Nurse

## 2020-08-13 ENCOUNTER — Telehealth (HOSPITAL_BASED_OUTPATIENT_CLINIC_OR_DEPARTMENT_OTHER): Payer: Self-pay | Admitting: General Practice

## 2020-08-13 LAB — EKG

## 2020-08-13 MED ORDER — AMLODIPINE BESYLATE 5 MG PO TABS
5.0000 mg | ORAL_TABLET | Freq: Every day | ORAL | 3 refills | Status: DC
Start: 2020-08-13 — End: 2021-11-08

## 2020-08-13 MED ORDER — METOPROLOL SUCCINATE ER 25 MG PO TB24
25.0000 mg | ORAL_TABLET | Freq: Every day | ORAL | 3 refills | Status: DC
Start: 2020-08-13 — End: 2021-11-08

## 2020-08-13 MED ORDER — AMLODIPINE BESYLATE 5 MG PO TABS
5.0000 mg | ORAL_TABLET | Freq: Two times a day (BID) | ORAL | 3 refills | Status: DC
Start: 2020-08-13 — End: 2020-08-13

## 2020-08-13 MED FILL — METOPROL SUC 25MG ER: 90 days supply | Qty: 90 | Fill #0 | Status: CP

## 2020-08-13 NOTE — Telephone Encounter (Signed)
Call placed to pt via [name] interpreter. Pt aware to start new medication. She will pick it up tonight. Pt was given the number to clinic. She will stop med and call clinic for any adverse reaction. Pt was informed of need for additional testing. Pt in agreement with plan. Pt was not able to confirm her upcoming appointment with Dr. Ellan Lambert. She states she has to work and needs to reschedule the appointment. [place] desk staff working on finding an open Friday appointment for pt.    Clinton Quant, MD  P Ems Nurses Pool; [facility] Front Desk Pool  Dear nurses,   Please call this patient let her know that I have added a new medication called metoprolol XL 25 mg daily to see if it helps her with the symptoms of chest pain or shortness of breath when she walks up the hill. If she does not feel well on the medication, if she gets dizzy, lightheaded she can stop it.     Also I have ordered a coronary CTA at [facility] and pulmonary lung test at [place]-this tests needs to be booked     She needs a 3 months follow-up with me-in person     thanks

## 2020-08-13 NOTE — Telephone Encounter (Signed)
-----   Message from [place] sent at 08/13/2020  3:34 PM EDT -----  Cecille Rubin,  At this time Dr. Ellan Lambert does not have any Fridays open in July.   Candice

## 2020-08-13 NOTE — Telephone Encounter (Signed)
ED follow up scheduled visit with PCP for 08/17/2020

## 2020-08-13 NOTE — Telephone Encounter (Signed)
Call placed to pt via [name] interpreter. Pt informed that there are no Friday openings available for July. Pt states that she will keep her Monday appointment.      Message  Received: Today  Candice L Naughton  Eustace Pen, RN  Cecille Rubin,   At this time Dr. Ellan Lambert does not have any Fridays open in July.   Candice

## 2020-08-13 NOTE — Telephone Encounter (Signed)
-----   Message from Clinton Quant, MD sent at 08/13/2020  2:16 PM EDT -----  Dear nurses,  Please call this patient let her know that I have added a new medication called metoprolol XL 25 mg daily to see if it helps her with the symptoms of chest pain or shortness of breath when she walks up the hill.  If she does not feel well on the medication, if she gets dizzy, lightheaded she can stop it.    Also I have ordered a coronary CTA at [facility] and pulmonary lung test at [place]-this tests needs to be booked    She needs a 3 months follow-up with me-in person    thanks

## 2020-08-17 ENCOUNTER — Encounter (HOSPITAL_BASED_OUTPATIENT_CLINIC_OR_DEPARTMENT_OTHER): Payer: Self-pay | Admitting: Internal Medicine

## 2020-08-17 ENCOUNTER — Telehealth (HOSPITAL_BASED_OUTPATIENT_CLINIC_OR_DEPARTMENT_OTHER): Payer: Self-pay | Admitting: Internal Medicine

## 2020-08-17 ENCOUNTER — Other Ambulatory Visit: Payer: Self-pay

## 2020-08-17 ENCOUNTER — Ambulatory Visit: Payer: No Typology Code available for payment source | Attending: Internal Medicine | Admitting: Internal Medicine

## 2020-08-17 VITALS — BP 135/74 | HR 65 | Temp 97.7°F | Ht 62.01 in | Wt 162.8 lb

## 2020-08-17 DIAGNOSIS — Z1212 Encounter for screening for malignant neoplasm of rectum: Secondary | ICD-10-CM

## 2020-08-17 DIAGNOSIS — Z01818 Encounter for other preprocedural examination: Secondary | ICD-10-CM | POA: Diagnosis not present

## 2020-08-17 DIAGNOSIS — Z1211 Encounter for screening for malignant neoplasm of colon: Secondary | ICD-10-CM | POA: Insufficient documentation

## 2020-08-17 DIAGNOSIS — R5383 Other fatigue: Secondary | ICD-10-CM | POA: Diagnosis present

## 2020-08-17 DIAGNOSIS — N950 Postmenopausal bleeding: Secondary | ICD-10-CM | POA: Diagnosis present

## 2020-08-17 DIAGNOSIS — R5381 Other malaise: Secondary | ICD-10-CM | POA: Insufficient documentation

## 2020-08-17 DIAGNOSIS — H538 Other visual disturbances: Secondary | ICD-10-CM | POA: Diagnosis present

## 2020-08-17 DIAGNOSIS — Z1231 Encounter for screening mammogram for malignant neoplasm of breast: Secondary | ICD-10-CM | POA: Insufficient documentation

## 2020-08-17 DIAGNOSIS — R0602 Shortness of breath: Secondary | ICD-10-CM | POA: Diagnosis not present

## 2020-08-17 LAB — CBC WITH PLATELET
ABSOLUTE NRBC COUNT: 0 10*3/uL (ref 0.0–0.0)
HEMATOCRIT: 40 % (ref 34.1–44.9)
HEMOGLOBIN: 12.7 g/dL (ref 11.2–15.7)
MEAN CORP HGB CONC: 31.8 g/dL (ref 31.0–37.0)
MEAN CORPUSCULAR HGB: 30.8 pg (ref 26.0–34.0)
MEAN CORPUSCULAR VOL: 96.9 fl (ref 80.0–100.0)
MEAN PLATELET VOLUME: 12.8 fL — ABNORMAL HIGH (ref 8.7–12.5)
NRBC %: 0 % (ref 0.0–0.0)
PLATELET COUNT: 248 10*3/uL (ref 150–400)
RBC DISTRIBUTION WIDTH STD DEV: 44.9 fL (ref 35.1–46.3)
RED BLOOD CELL COUNT: 4.13 M/uL (ref 3.90–5.20)
WHITE BLOOD CELL COUNT: 5.5 10*3/uL (ref 4.0–11.0)

## 2020-08-17 LAB — VITAMIN D,25 HYDROXY: VITAMIN D,25 HYDROXY: 30 ng/mL (ref 30.0–100.0)

## 2020-08-17 LAB — FERRITIN: FERRITIN: 58 ng/mL (ref 8–252)

## 2020-08-17 LAB — VITAMIN B12: VITAMIN B12: 585 pg/mL (ref 193–986)

## 2020-08-17 LAB — TOTAL IRON BINDING CAPACITY: TOTAL IRON BIND CAPACITY CALC: 314 ug/dL (ref 280–504)

## 2020-08-17 LAB — IRON: IRON: 77 ug/dL (ref 50–170)

## 2020-08-17 NOTE — Telephone Encounter (Signed)
[name] Interpreter ID [phone]    Reiterated providers message  Provided education as appropriate  Patient verbalized understanding  Agreeable to [facility] and rescheduling D&C    Angela Nevin, RN

## 2020-08-17 NOTE — Telephone Encounter (Addendum)
-----   Message from Clinton Quant, MD sent at 08/17/2020  1:13 PM EDT -----  Regarding: RE: f/u  I actually prefer BI for coronary CTA- I think is a better test and better reporting.    Please let patient know.  ----- Message -----  From: Reigna Ruperto [name], APRN  Sent: 08/17/2020  10:20 AM EDT  To: Clinton Quant, MD  Subject: f/u                                              Hello Dr. Ellan Lambert    In additional to the last message, I just spoke with PT. She would like to  have the CTA done at [facility], is that possible. Can you help arrange this ?     Thank you  Hoyle Sauer   ----- Message -----  From: Clinton Quant, MD  Sent: 08/12/2020   2:52 PM EDT  To: Maeva Dant [name], APRN    Randall Hiss    I did a televisit with her for  DOE and CP- hard to tell what it is- [facility].  Her stress echo was normal but she passed out after the stress echo, she only walked for 3 minutes which is not the very good exercise capacity.  The echo images did not show any evidence of significant ischemia.  Something is not right with her DOE-I am also wondering if she has pulmonary hypertension that we are not picking up on echo.    I noted very low ferritin level in 2009-that can explain shortness of breath and fatigue.  I would recommend to repeat iron studies    She was concerned about vitamin B12, vitamin D deficiencies which are all good reasons to feel fatigue and with exercise intolerance.  Can you please obtain vitamin D level and TSH    Thanks!

## 2020-08-17 NOTE — Telephone Encounter (Signed)
Hello RNs    Please call pt    Pt is to have a CTA at [facility]  I have spoke with Dr. Ellan Lambert who advises pt should be seen at [facility] for her CTA. ( pt wanted to go to [facility])     She should also have her CTA procedure before having her D&C  As dicussed with Dr. Ellan Lambert  I have also sent a message to Dr. Darl Pikes [facility] - pt will need to have her D&C, Hysteroscopy rescheduled pending her CTA    Below are both messages sent to Dr. Ellan Lambert       ===View-only below this line===  ----- Message -----  From: Clinton Quant, MD  Sent: 08/17/2020   1:17 PM EDT  To: Fredna Stricker [name], APRN    I think she should have the CTA first/ at [facility]    I am not sure what is going on with this patient.  I never met her before, we did a televisit and it is even harder hard for me to assess the nature of her symptoms.   I was told by the witnesses in the stress lab that she passed out due to anxiety...    Perfect vital signs during her syncope, she was also breathing on her own.    Best regards    ----- Message -----  From: Taylie Helder [name], APRN  Sent: 08/17/2020  10:07 AM EDT  To: Clinton Quant, MD    Hi Dr. Ellan Lambert    Thank you so much for letting me know. Pt is seeing me today for pre op exam, is scheduled to have D&C due to postmenopausal bleeding 4/27. I wanted to see if she will need to have her coronary CTA  before having any procedures.  I will also check her labs as you recommended   Let me know, thank you !  Hoyle Sauer   ----- Message -----  From: Clinton Quant, MD  Sent: 08/12/2020   2:52 PM EDT  To: Tyreek Clabo [name], APRN    Randall Hiss    I did a televisit with her for  DOE and CP- hard to tell what it is- I am referring for a cardiopulmonary exercise at [facility].  Her stress echo was normal but she passed out after the stress echo, she only walked for 3 minutes which is not the very good exercise capacity.  The echo images did not show any evidence of significant ischemia.  Something is not right with her DOE-I am also wondering if she has  pulmonary hypertension that we are not picking up on echo.    I noted very low ferritin level in 2009-that can explain shortness of breath and fatigue.  I would recommend to repeat iron studies    She was concerned about vitamin B12, vitamin D deficiencies which are all good reasons to feel fatigue and with exercise intolerance.  Can you please obtain vitamin D level and TSH    Thanks!

## 2020-08-17 NOTE — Progress Notes (Signed)
SUBJECTIVE:    Tina [name] is a 65 y.o who presents for pre op exam    Hx of postmenopausal bleeding. Is scheduled to 4/27 for D&C    Hx of exertional chest pain and SOB  Followed by Cardiology   Had a stress test 3/29 with syncopal episode  No arrhythmias on the EKG at the time of the syncopal episode   Was seen in ED      Had a chest CT scan with impression of  Pulmonary embolus protocol CT scan of the chest is degraded by respiratory motion artifact and reveals:  1.  No central pulmonary emboli.  2.  Mild atherosclerosis, including mild CAD.    Per Cardiology it has been  recommended for pt to have a coronary CTA to rule out CAD at [facility]     Would like to have the procedure done at [facility] instead of [facility]    Reports having hx of syncope, since age 73  Had episodes of during her pregnancy, had a work up in [place] no cause found secondary to  her syncope  Admits to not mentioning her hx of syncope   before in the past to a medical provider here in the [place], until her recent episode     Also had  feelings of weakness  Per Cardiology, this provider was advised to have f/u labs-  Check iron studies due to pt hx of anemia       Patient Active Problem List:     Multiple Breast Cysts     Menorrhagia     Hypertension     Hyperlipidemia LDL goal < 130     Hot flashes not due to menopause     Tendonitis     Sinusitis     Tension headache, chronic     Routine general medical examination at a health care facility     Atrial ectopy     Vitamin D deficiency     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     Chronic left hip pain     Postmenopausal bleeding     Vulvar lesion      Current Outpatient Medications   Medication Sig   . amLODIPine (NORVASC) 5 MG tablet Take 1 tablet by mouth daily   . metoprolol (TOPROL-XL) 25 MG 24 hr tablet Take 1 tablet by mouth daily   . omeprazole (PRILOSEC) 20 MG capsule Take 1 capsule by mouth daily   . gemfibrozil (LOPID) 600 MG tablet Take 1 tablet by mouth 2 (two) times  daily before meals   . FLUoxetine (PROZAC) 20 MG capsule Take 1 capsule by mouth daily   . meloxicam (MOBIC) 15 MG tablet Take 1 tablet by mouth daily   . Cholecalciferol (VITAMIN D3) 1000 units TABS Tablet TAKE ONE TABLET BY MOUTH DAILY   . EPINEPHrine 0.3 MG/0.3ML auto-injector Inject 0.3 mg into the muscle once as needed for up to 1 dose     No current facility-administered medications for this visit.     Facility-Administered Medications Ordered in Other Visits   Medication   . sodium chloride 0.9 % flush 10-100 mL       Allergies:  Review of Patient's Allergies indicates:   Pravachol               Nausea Only, Other (See Comments)    Comment:Malaise   Simvastatin             Palpitations  Comment:"glottis swollen" in past, but says allergy             tested, not allergic to this med per pt 8/19    Health Maintenance:  ZOSTER VACCINE(1 of 2) Never done  AWQ Questionnaire due on 12/21/2018  PEG SCORE (CHRONIC PAIN) due on 12/21/2018  FECAL OCCULT BLOOD AGE 44+ due on 01/04/2019  INFLUENZA VACCINE(1) due on 01/14/2020  HEALTH CARE PROXY due on 08/30/2020  MAMMOGRAPHY due on 11/28/2020  PNEUMOCOCCAL VACCINE SERIES (65+)(1 of 1 - PPSV23) due on 12/27/2022  LIPID SCREENING due on 04/27/2023  TETANUS VACCINE(2 - Td or Tdap) due on 01/14/2024  PAP SMEAR due on 10/20/2024  HPV SCREENING due on 10/20/2024  PHYSICAL EXAM Completed  HIV SCREENING Completed  HEP C SCREEN Completed  COVID-19 Vaccine Completed  PNEUMOCOCCAL VACCINE SERIES (< 65) Aged Out    Immunizations:  Immunization History   Administered Date(s) Administered   . Covid-19 Vaccine [employer] - [employer]) 08/11/2019, 09/01/2019, 04/09/2020   . Depo Provera 01/02/2005, 04/24/2008, 07/10/2008, 09/04/2008, 10/16/2008, 11/13/2008   . H1N1 0.56ml W/ Preserv 3 & > 07/10/2008   . HEP B ADULT 3 DOSE 20 and > 04/09/2015, 05/07/2015, 07/24/2016   . INFLUENZA VIRUS TRI W/PRESV VACCINE 18/> YRS IM (PRIVATE) 03/19/2009, 01/28/2010, 03/22/2011, 03/12/2012   . Influenza  Virus Quad Presv Free Vacc 6 Mo and Older, IM 05/27/2014, 02/06/2017, 05/24/2018   . Influenza Virus Quad W/Presv Vacc 6 Mo and Older, IM 03/13/2013, 07/24/2016   . Td 04/20/2003   . Tdap 01/13/2014       Histories:  Past Medical History:  No date: Arthritis  No date: H/o Condyloma      Comment:  2003 (vulvar)  No date: HTN (hypertension)  No date: Hypercholesteremia  No date: Hyperopia  No date: Presbyopia  06/17/2010: Right ear pain      Comment:  Seen by ENT. 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                No signs of infection on the right. Sent for hearing                test.   No date: S/P appendectomy  No date: S/P laparoscopic procedure      Comment:  Exploratory  No date: Wears eyeglasses  Past Surgical History:  2000: APPENDECTOMY      Comment:  laparoscopy  9/04: ENDOMETRIAL BX W/WO ENDOCERVIX BX W/O DILAT SPX      Comment:  endometrial bx = negative  No date: HYSTEROSCOPY BX ENDOMETRIUM&/POLYPC W/WO D&C      Comment:  3 times in [place]  No date: OB ANTEPARTUM CARE CESAREAN DLVR & POSTPARTUM      Comment:  tubal ligation  Social History     Socioeconomic History   . Marital status: Married     Spouse name: [name]   . Number of children: 4   . Years of education: Not on file   . Highest education level: Not on file   Occupational History   . Occupation: Optician, dispensing: SELF EM[employer]     Comment: Works daytime   Tobacco Use   . Smoking status: Former Research scientist (life sciences)   . Smokeless tobacco: Never Used   Substance and Sexual Activity   . Alcohol use: Yes     Comment: Occasionally   . Drug use: No   . Sexual activity: Yes     Partners: Male  Birth control/protection: Surgical   Other Topics Concern   . Not on file   Social History Narrative    Immigrated from [place] in 2003   Social Determinants of Health  Financial Resource Strain: Not on file  Food Insecurity: Not on file  Transportation Needs: Not on file  Physical Activity: Not on file  Stress: Not on file  Social Connections: Not on  file  Intimate Partner Violence: Not on file  Housing Stability: Not on file  Review of patient's family history indicates:  Problem: OTHER      Relation: Mother          Age of Onset: (Not Specified)          Comment: HTN, Heart problems  Problem: DES Exp      Relation: Father          Age of Onset: (Not Specified)          Comment: Died during heart catheterization at age 33  Problem: Glaucoma      Relation: Father          Age of Onset: (Not Specified)  Problem: Macular Degeneration      Relation: Father          Age of Onset: (Not Specified)  Problem: Cancer - Other      Relation: Paternal Grandfather          Age of Onset: (Not Specified)          Comment: prostate cancer   Problem: Cancer - Other      Relation: Paternal Uncle          Age of Onset: (Not Specified)          Comment: prostate cancer  Problem: Cancer - Breast      Relation: Paternal 27          Age of Onset: (Not Specified)      Review of Systems:                   Skin: negative  Eyes: voices concerns of having blurred vision of both eyes, + farsightedness   Ears/Nose/Throat: negative  Respiratory: negative  Cardiovascular: negative  Gastrointestinal: negative  Genitourinary: negative  VVO:HYWVPX menses, no abnormal bleeding, pelvic pain or discharge  no breast pain or new or enlarging lumps on self exam  Musculoskeletal: negative  Neurologic: negative  Endocrine: negative  Psychiatric: negative  Hematologic/Lymphatic/Immunologic: negative    Physical:  BP 135/74 (Site: RA, Position: Sitting, Cuff Size: Reg)   Pulse 65   Temp 97.7 F (36.5 C) (Temporal)   Ht 5' 2.01" (1.575 m)   Wt 73.8 kg (162 lb 12.8 oz)   LMP 06/08/2008   SpO2 100%   BMI 29.77 kg/m   General appearance: healthy, alert, well developed, well nourished  Eyes: conjunctivae/corneas clear. PERRL, EOM's intact. Fundi benign  Skin: skin color, texture, turgor are normal  Head: Normocephalic. No masses, lesions, tenderness or abnormalities  Ears: External ears normal.  Canals clear. TM's normal.  Nose/Sinuses: Nares normal. Septum midline. Mucosa normal. No drainage or sinus tenderness.  Oropharynx: Lips, mucosa, and tongue normal. Teeth and gums normal. Oropharynx moist and without lesion  Neck: Neck supple. No adenopathy. Thyroid symmetric, normal size, and without nodularity  Back: Back symmetric, no curvature. ROM normal. No CVA tenderness.  Lungs: Percussion normal. Good diaphragmatic excursion. Lungs clear to auscultation bilaterally  Heart: PMI normal. No lifts, heaves, or thrills. RRR. No murmurs, clicks, gallops or rubs  Abdomen: Abdomen soft, non-tender. BS normal. No masses, no organomegaly  Extremities: Extremities normal. No deformities, edema, or skin discoloration  Musculoskeletal: Spine ROM normal. Muscular strength intact.  Neuro: Gait normal. Reflexes normal and symmetric. Sensation grossly normal       ASSESSMENT/PLAN:    (N95.0) Postmenopausal bleeding  (primary encounter diagnosis)  Comment:   Plan: and   (O87.867) Preoperative examination  Comment:   Plan:  Normal pre op exam, will need to consult with Cardiology due to pt current cardiac work up  Will need to see if CTA needs to be completed prior to [facility]  Message sent to Dr. Ellan Lambert        (R06.02) SOB (shortness of breath)  Comment: and   (R53.81,  R53.83) Malaise and fatigue  Comment:   Plan: CBC WITH PLATELET, FERRITIN, IRON, TOTAL IRON         BINDING CAPACITY, VITAMIN B12, VITAMIN D,25         HYDROXY, COLLECTION VENOUS BLOOD VENIPUNCTURE         (Z12.11,  Z12.12) Screening for colorectal cancer  Comment: per HM  Plan: POC IMMUNOASSAY FECAL OCCULT BLOOD TEST            (Z12.31) Screening mammogram for breast cancer  Comment: per HM  Plan:  SCREENING MAMMO BILATERAL DIGITAL WITH DBT &        CAD           (H53.8) Blurred vision, bilateral  Comment:   Plan:REFERRAL TO OPHTHALMOLOGY ( INT)

## 2020-08-18 ENCOUNTER — Telehealth (HOSPITAL_BASED_OUTPATIENT_CLINIC_OR_DEPARTMENT_OTHER): Payer: Self-pay | Admitting: Obstetrics & Gynecology

## 2020-08-18 NOTE — Telephone Encounter (Signed)
Called pt w/ [name] interpreter.   Patient recently seen by Cardiology and requesting further work-up prior to performing procedure.   Reviewed with patient.   She states that the CTA has been scheduled for June.   She denies further vaginal bleeding, just a little bit of dark discharge, but not blood.   Discussed option of EMBx given it will be a bit of time to do the procedure.   Reviewed may still need to do D&C even if EMbx is negative.   She is wondering if hysterectomy is the best next step. Reviewed need to r/o hyperplasia first and also make sure she is safe for surgery.   Has pre-op appt 4/13 -> will convert this to EMBx.     Carmelina Paddock, MD

## 2020-08-20 ENCOUNTER — Encounter (HOSPITAL_BASED_OUTPATIENT_CLINIC_OR_DEPARTMENT_OTHER): Payer: Self-pay | Admitting: Internal Medicine

## 2020-08-25 ENCOUNTER — Other Ambulatory Visit: Payer: Self-pay

## 2020-08-25 ENCOUNTER — Encounter (HOSPITAL_BASED_OUTPATIENT_CLINIC_OR_DEPARTMENT_OTHER): Payer: Self-pay | Admitting: Obstetrics & Gynecology

## 2020-08-25 ENCOUNTER — Ambulatory Visit
Payer: No Typology Code available for payment source | Attending: Obstetrics & Gynecology | Admitting: Obstetrics & Gynecology

## 2020-08-25 VITALS — BP 116/65 | HR 63 | Wt 163.0 lb

## 2020-08-25 DIAGNOSIS — N950 Postmenopausal bleeding: Secondary | ICD-10-CM | POA: Diagnosis not present

## 2020-08-25 MED ORDER — IBUPROFEN 600 MG PO TABS
600.00 mg | ORAL_TABLET | Freq: Once | ORAL | Status: AC
Start: 2020-08-25 — End: 2020-08-25
  Administered 2020-08-25: 600 mg via ORAL

## 2020-08-25 NOTE — Progress Notes (Signed)
Tina [name] is a 65 year old G:4 P:4 here for EMBx in setting of PMBx. Her D&C procedure is delayed due to need for work-up prior.     10/2019: ENDOMETRIUM, BIOPSY:   BENIGN ENDOMETRIAL POLYP AND ATROPHIC ENDOMETRIAL EPITHELIUM IN BACKGROUND.     10/2019:   LMP: Postmenopausal     TECHNIQUE:     Transabdominal and transvaginal pelvic sonography were performed.   Transvaginal scanning was performed to better evaluate the uterus and adnexa.   3-D imaging was performed to evaluate the uterine contour and endometrium.     FINDINGS:     Uterus: 6.0 x 2.3 x 3.8 cm. Intramural fundal fibroid measures roughly 1.5 cm, with a 7 mm calcified focus measuring 0.7 x 0.5 x 0.6 cm. The myometrium is otherwise unremarkable     Endometrium: 3 mm in thickness. The endometrium appears grossly unremarkable, but is poorly visualized. Patient was uncomfortable during the transvaginal exam     Cervix: Unremarkable.     Right ovary: Not seen due to body habitus and bowel gas.     Left ovary: Not seen due to body habitus and bowel gas     Free fluid: None.     Kidneys: No hydronephrosis on the provided images.     IMPRESSION:   Ovaries not seen either transabdominally or transvaginally.   Small fibroid.   Otherwise unremarkable study       Reviewed today that her prior testing was 1 year ago. If she continued to be concerned and had wanted D&C, as we had scheduled, and since D&C delayed rec would be to do sampling today and to check [place]. If testing wnl, can defer D&C. She agrees w/ this plan. Forgot to take ibuprofen prior.     BP 116/65   Pulse 63   Wt 73.9 kg (163 lb)   LMP 06/08/2008   SpO2 98%   BMI 29.81 kg/m   NAd, comfortable, abd soft, nontener.   Uterus small/mobile/nontender  We also talked about removing the vulvar lesion, which she desired to try to do today.       PATIENT/PROCEDURE VERIFICATION DOCUMENTATION    Correct patient: Yes  Correct procedure: Yes  Correct side, site, mark visible if applicable: Yes  Correct  position: Yes  Special equipment/implant(s) present, if applicable: Yes    Time-out completed, documented by provider doing procedure or designated team member:  Carmelina Paddock, MD    08/25/2020    5:04 PM        65 year old  is here for endometrial biopsy    OB History   G4  P4  T4  P0  A0  L4    SAB0  IAB0  Ectopic0  Molar0  Multiple0  Live Births4       Comment: C/S done because baby was large and had hemorrhaging with prior deliveries.    Indication: postmenopausal bleeding    Patient's last menstrual period was 06/08/2008.     Risks reviewed and verbal consent given.    Procedure:  Uterus palpated: anteverted, normal size and non-tender  Vagina and cervix prepped with betadine.  Paracervical block administered none  Dilation required no  pipelle started to be inserted but patient in significant pain, moving up table and procedure discontinued.     After this she declined vulvar bx as well.     Post Pain Assessment:  Post pain assessment done. Patient rates pain as a 10 on a 0-10 pain scale. Ibuprofen  given    Estimated blood loss: 0 cc    A/ (N95.0) Postmenopausal bleeding  Remote history of postmenopausal bleeding w/ possible polyp on EMBx  She desired D&C almost 1 year later but did not get medical clearance.   Given this, I recommended she return to the office for repeat EMBx. Has not had further vaginal bleeding. EMBx very painful and procedure discontinued prior to obtaining a sample. Will get pelvic US. If EMS thin and no bleeding will review r/b of continued eval v. Holding as long as no further episodes of bleeding.   All questions/concerns answered at this time.   Patient did well after Ibuprofen and left.     Carmelina Paddock, MD

## 2020-09-01 ENCOUNTER — Ambulatory Visit (HOSPITAL_BASED_OUTPATIENT_CLINIC_OR_DEPARTMENT_OTHER): Payer: Self-pay

## 2020-09-06 ENCOUNTER — Ambulatory Visit (HOSPITAL_BASED_OUTPATIENT_CLINIC_OR_DEPARTMENT_OTHER): Payer: No Typology Code available for payment source

## 2020-09-08 ENCOUNTER — Ambulatory Visit (HOSPITAL_BASED_OUTPATIENT_CLINIC_OR_DEPARTMENT_OTHER)
Admission: RE | Admit: 2020-09-08 | Payer: No Typology Code available for payment source | Source: Ambulatory Visit | Admitting: Obstetrics & Gynecology

## 2020-09-08 ENCOUNTER — Encounter (HOSPITAL_BASED_OUTPATIENT_CLINIC_OR_DEPARTMENT_OTHER): Admission: RE | Payer: Self-pay | Source: Ambulatory Visit

## 2020-09-08 DIAGNOSIS — N950 Postmenopausal bleeding: Secondary | ICD-10-CM | POA: Insufficient documentation

## 2020-09-08 DIAGNOSIS — N9089 Other specified noninflammatory disorders of vulva and perineum: Secondary | ICD-10-CM

## 2020-09-08 SURGERY — HYSTEROSCOPY, WITH DILATION AND CURETTAGE OF UTERUS
Anesthesia: General | Site: Uterus

## 2020-09-10 ENCOUNTER — Other Ambulatory Visit (HOSPITAL_BASED_OUTPATIENT_CLINIC_OR_DEPARTMENT_OTHER): Payer: Self-pay | Admitting: Specialist

## 2020-09-10 ENCOUNTER — Other Ambulatory Visit: Payer: Self-pay

## 2020-09-10 ENCOUNTER — Other Ambulatory Visit (HOSPITAL_BASED_OUTPATIENT_CLINIC_OR_DEPARTMENT_OTHER): Payer: Self-pay | Admitting: Obstetrics & Gynecology

## 2020-09-10 ENCOUNTER — Ambulatory Visit
Admission: RE | Admit: 2020-09-10 | Discharge: 2020-09-10 | Disposition: A | Discharge status: Home / Self Care | Payer: No Typology Code available for payment source | Attending: Obstetrics & Gynecology | Admitting: Obstetrics & Gynecology

## 2020-09-10 DIAGNOSIS — N950 Postmenopausal bleeding: Secondary | ICD-10-CM | POA: Diagnosis not present

## 2020-09-13 ENCOUNTER — Telehealth (HOSPITAL_BASED_OUTPATIENT_CLINIC_OR_DEPARTMENT_OTHER): Payer: Self-pay | Admitting: Obstetrics & Gynecology

## 2020-09-13 NOTE — Telephone Encounter (Signed)
Hi,   Please let Phung know that her [place] was non-diagnostic. I would like to talk to her about results and possibilities for follow-up. Tele-visit is fine. Please schedule in the next 2-4 weeks.   Thanks,   Maudie Mercury

## 2020-09-13 NOTE — Telephone Encounter (Signed)
Message from Dr Bernie Covey discussed with patient with the help of a Telephone [place] Interpreter  The patient indicates understanding of these issues and agrees with the plan.

## 2020-09-17 ENCOUNTER — Ambulatory Visit
Admission: RE | Admit: 2020-09-17 | Discharge: 2020-09-17 | Disposition: A | Discharge status: Home / Self Care | Payer: No Typology Code available for payment source | Attending: Internal Medicine | Admitting: Internal Medicine

## 2020-09-17 ENCOUNTER — Encounter (HOSPITAL_BASED_OUTPATIENT_CLINIC_OR_DEPARTMENT_OTHER): Payer: Self-pay

## 2020-09-17 ENCOUNTER — Other Ambulatory Visit: Payer: Self-pay

## 2020-09-17 DIAGNOSIS — Z1231 Encounter for screening mammogram for malignant neoplasm of breast: Secondary | ICD-10-CM | POA: Diagnosis not present

## 2020-09-17 HISTORY — DX: Other specified health status: Z78.9

## 2020-09-22 ENCOUNTER — Ambulatory Visit (HOSPITAL_BASED_OUTPATIENT_CLINIC_OR_DEPARTMENT_OTHER): Payer: Self-pay | Admitting: Obstetrics & Gynecology

## 2020-10-04 ENCOUNTER — Ambulatory Visit (HOSPITAL_BASED_OUTPATIENT_CLINIC_OR_DEPARTMENT_OTHER): Payer: Self-pay | Admitting: Obstetrics & Gynecology

## 2020-10-05 ENCOUNTER — Ambulatory Visit
Payer: No Typology Code available for payment source | Attending: Obstetrics & Gynecology | Admitting: Obstetrics & Gynecology

## 2020-10-05 ENCOUNTER — Encounter (HOSPITAL_BASED_OUTPATIENT_CLINIC_OR_DEPARTMENT_OTHER): Payer: Self-pay | Admitting: Obstetrics & Gynecology

## 2020-10-05 ENCOUNTER — Other Ambulatory Visit: Payer: Self-pay

## 2020-10-05 VITALS — BP 132/80 | HR 63 | Wt 165.0 lb

## 2020-10-05 DIAGNOSIS — N950 Postmenopausal bleeding: Secondary | ICD-10-CM | POA: Insufficient documentation

## 2020-10-05 NOTE — Progress Notes (Signed)
[name] interpreter used CB [id number]    F/u PMBx.   Recap:  Bleeding in June/July of 2021:  EMbx: atrophy, possible polyp  US showed thin EMS    Given polyp, it was recommended she consider D&C, which she desired.   Had pre-op with concerns for cardiac function and further testing needed - scheduled 10/2020. Given this, attempt was made for EMbx in the office which was unsuccesful due to discomfort. [place] repeated but EMS not visualized.     She is here today to discuss options.      She has had no bleeding but she has been having pain since the attempt at endometrial. She then said she had abnormal vaginal discharge after the attempt at biopsy, just a small amt of bloody discharge.   She has had cramping since then.     She is wondering if the [place] showed anything worrisome because she desires to travel for 5 months this summer and possible defer the D&C/hystereoscopy.     ,BP 132/80   Pulse 63   Wt 74.8 kg (165 lb)   LMP 06/08/2008   SpO2 98%   BMI 30.17 kg/m     A/ (N95.0) Postmenopausal bleeding  2 episodes of PMBx in 2021. Since that time no bleeding except for after attempted EMBx. Has a non-diagnostic [place]. EMBx in 2021 negative.   Reviewed that small risk of endometrial cancer but without persistent bleeding the likelihood of that is low. Given possible polyp in EMbx, completion of this eval includes D&C. This has been deferred given need for cardiac w/u which will be complete in June. Reviewed that if she defers D&C for >5 months the risk is delay of diagnosis of a cancer or pre-cancer and if she elects to do this to travel, she should get immediate gynecologic care if she has bleeding while away.   She is amenable o D&C in June if her cardiac w/u is normal and we can schedule in that manner. If not, she prefers to travel and will seek care after or if bleeding while away.   All questions/concerns answered at this time.   [name] interpreter used.     I spent a total of 35 minutes on this visit on the date  of service (total time includes all activities performed on the date of service)    Carmelina Paddock, MD

## 2020-10-13 ENCOUNTER — Ambulatory Visit (HOSPITAL_BASED_OUTPATIENT_CLINIC_OR_DEPARTMENT_OTHER): Payer: Self-pay | Admitting: Physician Assistant

## 2020-10-15 ENCOUNTER — Ambulatory Visit: Payer: No Typology Code available for payment source | Attending: Specialist | Admitting: Specialist

## 2020-10-15 ENCOUNTER — Other Ambulatory Visit: Payer: Self-pay

## 2020-10-15 ENCOUNTER — Encounter (HOSPITAL_BASED_OUTPATIENT_CLINIC_OR_DEPARTMENT_OTHER): Payer: Self-pay | Admitting: Specialist

## 2020-10-15 DIAGNOSIS — N281 Cyst of kidney, acquired: Secondary | ICD-10-CM | POA: Diagnosis present

## 2020-10-15 DIAGNOSIS — N3941 Urge incontinence: Secondary | ICD-10-CM | POA: Diagnosis present

## 2020-10-15 NOTE — Progress Notes (Signed)
The patient is portuguese speaking, and encounter performed with phone interpreter.    CC: renal cysts    HPI: This is a 65 year old female patient of [name], APRN   who has been referred today  in consultation for renal cysts.     First time seeing urology.     Patient was having abdominal pain and had a CT scan which shows two small right renal cysts.     No flank pain.   No dysuria. No gross hematuria.     Daytime freq "a lot" q 1+ hour with urgency and occ UUI. Symptoms x many months to a year.   No SUI.     no UTI.  no kidney stones.    + tobacco use, quit 20 years ago.       Drinking minimal water, several coffee with milk, tea in the winter, no soda       Hx of surgery for AUB  Still has uterus.   Followed by GYN.       Past Medical History:  No date: Arthritis  No date: H/o Condyloma      Comment:  2003 (vulvar)  No date: HTN (hypertension)  No date: Hypercholesteremia  No date: Hyperopia  No date: No known problems  No date: Presbyopia  06/17/2010: Right ear pain      Comment:  Seen by ENT. 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                No signs of infection on the right. Sent for hearing                test.   No date: S/P appendectomy  No date: S/P laparoscopic procedure      Comment:  Exploratory  No date: Wears eyeglasses    Past Surgical History:  2000: APPENDECTOMY      Comment:  laparoscopy  9/04: ENDOMETRIAL BX W/WO ENDOCERVIX BX W/O DILAT SPX      Comment:  endometrial bx = negative  No date: HYSTEROSCOPY BX ENDOMETRIUM&/POLYPC W/WO D&C      Comment:  3 times in [place]  No date: OB ANTEPARTUM CARE CESAREAN DLVR & POSTPARTUM      Comment:  tubal ligation    amLODIPine (NORVASC) 5 MG tablet, Take 1 tablet by mouth daily, Disp: 90 tablet, Rfl: 3  metoprolol (TOPROL-XL) 25 MG 24 hr tablet, Take 1 tablet by mouth daily, Disp: 90 tablet, Rfl: 3  omeprazole (PRILOSEC) 20 MG capsule, Take 1 capsule by mouth daily, Disp: 90 capsule, Rfl: 3  gemfibrozil (LOPID) 600 MG tablet, Take 1 tablet  by mouth 2 (two) times daily before meals, Disp: 180 tablet, Rfl: 3  FLUoxetine (PROZAC) 20 MG capsule, Take 1 capsule by mouth daily, Disp: 90 capsule, Rfl: 3  meloxicam (MOBIC) 15 MG tablet, Take 1 tablet by mouth daily, Disp: 30 tablet, Rfl: 2  Cholecalciferol (VITAMIN D3) 1000 units TABS Tablet, TAKE ONE TABLET BY MOUTH DAILY, Disp: 30 tablet, Rfl: 11  EPINEPHrine 0.3 MG/0.3ML auto-injector, Inject 0.3 mg into the muscle once as needed for up to 1 dose, Disp: 2 each, Rfl: 1    sodium chloride 0.9 % flush 10-100 mL, 10-100 mL, Intra-articular, Once in imaging, Frances Maywood, MD        Review of Patient's Allergies indicates:   Pravachol               Nausea Only, Other (See Comments)  Comment:Malaise   Simvastatin             Palpitations    Comment:"glottis swollen" in past, but says allergy             tested, not allergic to this med per pt 8/19      Social History     Socioeconomic History   . Marital status: Married     Spouse name: [name]   . Number of children: 4   . Years of education: Not on file   . Highest education level: Not on file   Occupational History   . Occupation: Optician, dispensing: SELF EMPLO     Comment: Works daytime   Tobacco Use   . Smoking status: Former Research scientist (life sciences)   . Smokeless tobacco: Never Used   Substance and Sexual Activity   . Alcohol use: Yes     Comment: Occasionally   . Drug use: No   . Sexual activity: Yes     Partners: Male     Birth control/protection: Surgical   Other Topics Concern   . Not on file   Social History Narrative    Immigrated from [place] in 2003   Social Determinants of Health  Financial Resource Strain: Not on file  Food Insecurity: Not on file  Transportation Needs: Not on file  Physical Activity: Not on file  Stress: Not on file  Social Connections: Not on file  Intimate Partner Violence: Not on file  Housing Stability: Not on file  Social history has been reviewed in the chart, and no new changes are noted.      REVIEW OF SYSTEMS:  CONSTITUTIONAL:  Negative for any fever, chills, weight loss or weight gain.  EYES: No eye pain, eye redness  ENT: No difficulty swallowing. No nose bleeds. No sore throat. No ear ache.  CARDIOVASCULAR: No chest pain. No palpitation. No fainting.  RESPIRATORY: No SOB. No wheezing. No chest tightness. No cough.  GASTROINTESTINAL: No vomiting. No constipation. No diarrhea.  GENITOURINARY: Voiding as above in HPI.  NEUROLOGIC: No mental status changes or visual changes. No seizures or paresthesias.   ENDOCRINE: No excessive thirst.  HEMATOLOGIC: No excessive bruising. No excessive bleeding.  SKIN: No itchiness. No rashes.  PSYCHIATRIC: no anxiety. No hallucinations. no depression.   MUSCULOSKETAL: no joint swelling. Normal gait.       PHYSICAL EXAMINATION:   GENERAL: The patient is healthy-appearing. Well developed. Well nourished. No acute distress.   SKIN: No rashes. No lesions.  HEENT: Atraumatic, normocephalic. Tongue midline. Conjunctivae and sclerae are clear. Extraocular movements are full. Oral mucosa normal. Pharynx is negative.  NECK: Supple. There is no lymphadenopathy. No masses are noted. Trachea is midline.   LUNGS: Normal respiratory effort. No wheezing.  CARDIOVASCULAR: Regular pulses in upper extremities.   ABDOMEN: No organomegaly. No masses. No tenderness.   NEUROLOGICAL: Alert and oriented X 3. Good gait and balance.  PSYCH: Mood stable. Affect normal.  NEUROLOGICAL: Alert and oriented. Good gait and balance.        LABS reviewed:      CREATININE (mg/dL)   Date Value   08/10/2020 0.8   03/09/2020 0.8   05/31/2018 0.7         Urine Analysis:  Lab Results   Component Value Date    UACOL YELLOW 08/10/2020    UACOL YELLOW 08/10/2020    UACLA CLEAR 08/10/2020    UACLA CLEAR 08/10/2020    UAGLU NEGATIVE 08/10/2020  UAGLU NEGATIVE 08/10/2020    UABIL NEGATIVE 08/10/2020    UABIL NEGATIVE 08/10/2020    UAKET NEGATIVE 08/10/2020    UAKET NEGATIVE 08/10/2020    SPEGRAVURINE 1.015 08/10/2020    SPEGRAVURINE 1.010 08/10/2020     UAOCC NEGATIVE 08/10/2020    UAOCC NEGATIVE 08/10/2020    UAPH 7.0 08/10/2020    UAPH 7.0 08/10/2020    UAPRO NEGATIVE 08/10/2020    UAPRO NEGATIVE 08/10/2020    UANIT NEGATIVE 08/10/2020    UANIT NEGATIVE 08/10/2020    LEUKOCYTES NEGATIVE 08/10/2020    LEUKOCYTES NEGATIVE 08/10/2020       Component Ref Range & Units 08/10/20 1506    COLOR YELLOW YELLOW    CLARITY CLEAR CLEAR    GLUCOSE, URINE NEGATIVE MG/DL NEGATIVE    BILIRUBIN, URINE NEGATIVE NEGATIVE    KETONE, URINE NEGATIVE MG/DL NEGATIVE    SPECIFIC GRAVITY URINE 1.003 - 1.035 1.010    OCCULT BLOOD, URINE NEGATIVE NEGATIVE    PH URINE 5.0 - 8.0 7.0    PROTEIN, URINE NEGATIVE MG/DL NEGATIVE    NITRITE, URINE NEGATIVE NEGATIVE    LEUKOCYTE ESTERASE NEGATIVE NEGATIVE    MICROSCOPIC NOT INDICAT NOT INDICATED              Narrative                IMAGING reviewed:  CT 10/2019  Kidneys: No hydronephrosis. Normal renal enhancement. There are 2 subcentimeter hypodensities in the right kidney which are probably cysts, unchanged     IMPRESSION:     No acute abdominopelvic pathology or findings to explain symptoms.   Colonic diverticulosis.             65 year old female patient seen today  in consultation  for right renal cysts.    CT 10/2019 two subcm right renal cysts.   Plan for renal [place]     She will be traveling to [place] in July to live with her daughter for ~ 5 months so will schedule when she returns.     FU with PCP/general surgery re hemorrhoids     Discussed behavioral modifications for urinary freq/ urgency including limiting bladder irritants/caffiene. We also discussed anticholinergic medications but would need follow up appointment for PVR so will defer until she returns. Alternatively she can find a urologist in [place] if she does not want to wait.       RTC 64M with RUS prior, UA, PVR    Angela Adam, PA-C

## 2020-10-15 NOTE — Progress Notes (Signed)
Addendum    I discussed the patient and agree with the assessment and plan of the above note.    65 YO FEMALE with renal cysts.  Also with frequency, and UUI.    Check renal [place]  She will be traveling to [place] to live with dtr for ~ 5 months.  May benefit from anticholinergic VS UDS.    RTC after [place],  CHECK PVR, dip    Toula Moos MD

## 2020-10-19 ENCOUNTER — Telehealth (HOSPITAL_BASED_OUTPATIENT_CLINIC_OR_DEPARTMENT_OTHER): Payer: Self-pay | Admitting: General Practice

## 2020-10-19 NOTE — Telephone Encounter (Signed)
-----   Message from Clinton Quant, MD sent at 10/18/2020  5:02 PM EDT -----  Please call patient to inform that the coronary CT scan done at [facility] shows very minimal cholesterol buildup inside the arteries of the heart.  This is good news.  She should follow-up with her regular doctor.    I recommend to keep a heart healthy diet, decrease intake of fat so the blockages do not get worse since she is not able to tolerate cholesterol medication.    Thanks    Hoyle Sauer- just [place]-

## 2020-10-19 NOTE — Telephone Encounter (Signed)
Call placed to pt via [name] interpreter. Pt very happy to hear her test results. Pt states that she continues to take Gemfibrozil twice per day. She will call her PCP Dr. [name] to schedule a follow up appointment. Pt would like Dr. Ellan Lambert to know that she is traveling in July and will not be able to make her scheduled appointment. This appointment has been canceled. Pt will reschedule when she returns to [place].      Clinton Quant, MD  [facility]  Cc: [name], APRN  Please call patient to inform that the coronary CT scan done at [facility] shows very minimal cholesterol buildup inside the arteries of the heart. This is good news. She should follow-up with her regular doctor.     I recommend to keep a heart healthy diet, decrease intake of fat so the blockages do not get worse since she is not able to tolerate cholesterol medication.     Thanks

## 2020-10-20 ENCOUNTER — Encounter (HOSPITAL_BASED_OUTPATIENT_CLINIC_OR_DEPARTMENT_OTHER): Payer: Self-pay

## 2020-10-20 ENCOUNTER — Encounter (HOSPITAL_BASED_OUTPATIENT_CLINIC_OR_DEPARTMENT_OTHER): Payer: Self-pay | Admitting: Obstetrics & Gynecology

## 2020-10-20 ENCOUNTER — Other Ambulatory Visit: Payer: Self-pay

## 2020-10-20 ENCOUNTER — Ambulatory Visit: Payer: No Typology Code available for payment source | Attending: Orthopaedic Surgery | Admitting: Orthopaedic Surgery

## 2020-10-20 DIAGNOSIS — M654 Radial styloid tenosynovitis [de Quervain]: Secondary | ICD-10-CM | POA: Diagnosis present

## 2020-10-20 DIAGNOSIS — N950 Postmenopausal bleeding: Secondary | ICD-10-CM

## 2020-10-20 MED ORDER — MELOXICAM 7.5 MG PO TABS
7.5000 mg | ORAL_TABLET | Freq: Every day | ORAL | 0 refills | Status: DC
Start: 2020-10-20 — End: 2021-11-08

## 2020-10-20 MED ORDER — DICLOFENAC SODIUM 1 % EX GEL
4.00 g | Freq: Four times a day (QID) | CUTANEOUS | 0 refills | Status: AC
Start: 2020-10-20 — End: 2020-11-19

## 2020-10-20 MED FILL — DICLOFENAC GEL 1%: 14 days supply | Qty: 100 | Fill #0 | Status: CP

## 2020-10-20 MED FILL — MELOXICAM 7.5MG: 30 days supply | Qty: 30 | Fill #0 | Status: CP

## 2020-10-20 NOTE — Progress Notes (Signed)
Date of service:  10/20/2020    I have personally seen and examined the patient.    This is a 65 year old year old female who presents for 2 months of left-sided hand, wrist and forearm pain.  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  She had developed gradual pain in the arm that has gotten progressively worse.  She frequently had to pick up the 74-year-old by is actually stopped babysitting due to the pain.  She denies numbness or tingling.    Physical examination:   General - the patient is alert and oriented x3, well appearing, in no acute distress.  Normal affect and mood.  Normal pupil dilation, extraocular motions intact  Nonlabored breathing  Left wrist inform demonstrates skin intact.  There is tenderness over the first extensor compartment.  Wrist extension 30, flexion 50.  Finkelstein's test positive.  There is pain with resisted thumb extension and abduction.  Grip strength 5/5 due to pain.  Sensation intact.  Radial pulse 2+       Assessment: Left wrist informed de Quervain's tenosynovitis    Plan: I reviewed the findings with the patient via telephone interpreter.  Her symptoms and exam are very consistent with tenosynovitis of the first wrist extensor compartment.  I reviewed conservative modalities including activity modification, ice massage.  I recommended getting a thumb spica brace; her insurance does not have DME benefits so she will need to get it on her own.  She was prescribed diclofenac gel.  She was also prescribed meloxicam as that has been helpful for her in the past and ibuprofen upsets her stomach.  She provided a home stretching program and referral to occupational therapy.  She may follow-up as needed.    I agree with the remainder of the plan per the PA and personally spent the substantive portion of the encounter providing care for the patient.    This note was prepared using voice recognition software.  Please disregard any transcription errors.

## 2020-10-20 NOTE — Patient Instructions (Addendum)
Patient Education     De Quervain's Tenosynovitis    Tennis Must Quervain's tenosynovitis is a condition that causes inflammation of the tendon on the thumb side of the wrist. Tendons are cords of tissue that connect bones to muscles. The tendons in the hand pass through a tunnel called a sheath. A slippery layer of tissue (synovium) lets the tendons move smoothly in the sheath. With de Quervain's tenosynovitis, the sheath swells or thickens, causing friction and pain.  The condition is also called de Quervain's disease and de Quervain's syndrome. It occurs most often in women who are 40-34 years old.  What are the causes?  The exact cause of this condition is not known. It may be associated with overuse of the hand and wrist.  What increases the risk?  You are more likely to develop this condition if you:   Use your hands far more than normal, especially if you repeat certain movements that involve twisting your hand or using a tight grip.   Are pregnant.   Are a middle-aged woman.   Have rheumatoid arthritis.   Have diabetes.  What are the signs or symptoms?  The main symptom of this condition is pain on the thumb side of the wrist. The pain may get worse when you grasp something or turn your wrist. Other symptoms may include:   Pain that extends up the forearm.   Swelling of your wrist and hand.   Trouble moving the thumb and wrist.   A sensation of snapping in the wrist.   A bump filled with fluid (cyst) in the area of the pain.  How is this diagnosed?  This condition may be diagnosed based on:   Your symptoms and medical history.   A physical exam. During the exam, your health care provider may do a simple test Wynn Maudlin test) that involves pulling your thumb and wrist to see if this causes pain.  You may also need to have an X-ray.  How is this treated?  Treatment for this condition may include:   Avoiding any activity that causes pain and swelling.   Taking medicines. Anti-inflammatory medicines and  corticosteroid injections may be used to reduce inflammation and relieve pain.   Wearing a splint.   Having surgery. This may be needed if other treatments do not work.  Once the pain and swelling has gone down:   Physical therapy. This includes stretching and strengthening exercises.   Occupational therapy. This includes adjusting how you move your wrist.  Follow these instructions at home:  If you have a splint:   Wear the splint as told by your health care provider. Remove it only as told by your health care provider.   Loosen the splint if your fingers tingle, become numb, or turn cold and blue.   Keep the splint clean.   If the splint is not waterproof:  ? Do not let it get wet.  ? Cover it with a watertight covering when you take a bath or a shower.  Managing pain, stiffness, and swelling     Avoid movements and activities that cause pain and swelling in the wrist area.   If directed, put ice on the painful area. This may be helpful after doing activities that involve the sore wrist.  ? Put ice in a plastic bag.  ? Place a towel between your skin and the bag.  ? Leave the ice on for 20 minutes, 2-3 times a day.   Move your fingers often  to avoid stiffness and to lessen swelling.   Raise (elevate) the injured area above the level of your heart while you are sitting or lying down.  General instructions   Return to your normal activities as told by your health care provider. Ask your health care provider what activities are safe for you.   Take over-the-counter and prescription medicines only as told by your health care provider.   Keep all follow-up visits as told by your health care provider. This is important.  Contact a health care provider if:   Your pain medicine does not help.   Your pain gets worse.   You develop new symptoms.  Summary   De Quervain's tenosynovitis is a condition that causes inflammation of the tendon on the thumb side of the wrist.   The condition occurs most often in  women who are 76-32 years old.   The exact cause of this condition is not known. It may be associated with overuse of the hand and wrist.   Treatment starts with avoiding activity that causes pain or swelling in the wrist area. Other treatment may include wearing a splint and taking medicine. Sometimes, surgery is needed.  This information is not intended to replace advice given to you by your health care provider. Make sure you discuss any questions you have with your health care provider.  Document Revised: 11/01/2017 Document Reviewed: 04/09/2017  Elsevier Patient Education  2021 [employer].

## 2020-10-20 NOTE — Progress Notes (Signed)
ORTHOPEDIC SURGERY OFFICE NOTE     Diagnosis: Left radial forearm pain x2 months    [name] telephone interpreter used for today's exam    HPI: This is a 65 year old [name] speaking female who presents her office today with 2 months of left radial forearm pain.  She is right-hand dominant.  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  She is taking Tylenol for pain management but states it really is not helpful.  She does not report any specific injury to the left arm.  She states she has had "multiple injections in her hands in the past".  She states that her symptoms came on little by little and would come and go for the first couple of weeks but then started to get worse over time.  No redness or warmth.  No numbness or tingling of her hand.  She states that her pain starts in her thumb and radiates up the radial aspect of her forearm almost up into her elbow level.  She has a hard time sleeping.  No other acute concerns.    Review of Systems: 11 systems reviewed and noted in the new patient form scanned into the Epic record for this date of service.    Physical Exam:  Vital Signs: LMP 06/08/2008   Gen: alert and oriented x3  HEENT: Head atraumatic, normocephalic. Conjunctivae clear. EOMI.  MSK: Left wrist  Evaluation of the Left hand reveals no gross bony deformities.  No rotational deformities noted.   Skin is warm, pink, dry and intact.   There is no erythema, edema, or ecchymosis. No sign of infection.   TTP along the radial thumb and first dorsal compartment  Positive Finkelstein test  All ligaments and tendons appear intact, with normal flexion and extension at DIP, PIP and MCP joints.   Full ROM of all 5 digits; able to make a fist.   No pain with passive or active ROM with digits 1-5.   Nailbeds intact.   Neurovascularly intact distally. +2 radial pulses.  Digits well perfused, capillary refill <2  sec at each digit.   Normal Allen's test  Negative Phalens exam, Negative Tinels exam at the wrist.     Radiology: None taken    Assessment and Plan: This is a 65 year old [name] speaking right-hand-dominant female who presents with 2 months of left wrist pain, radial side.  Her signs and symptoms are very consistent with classic de Quervain's tenosynovitis, as well as a possible left thumb trigger finger.  We have discussed conservative management for this type of tendinitis which includes a thumb spica brace that she will need to obtain over-the-counter as her insurance will not pay for it, ice massage, Voltaren gel to massage into the skin as ibuprofen seems to upset her stomach.  She has taken meloxicam in the past and I have written her prescription today to try it again to see if her stomach will be able to handle it.  We will refer her to occupational therapy to learn home stretching exercise program.    Follow-up as needed    Patient was seen and examined with Dr. Jalene Mullet, who agrees with the assessment and plan. Please see his attending note.    This note was prepared using voice recognition software. Please disregard any transcription errors.    Clemencia Course PA-C , 10/20/2020

## 2020-10-22 ENCOUNTER — Ambulatory Visit: Payer: No Typology Code available for payment source | Attending: Internal Medicine

## 2020-10-22 DIAGNOSIS — Z1212 Encounter for screening for malignant neoplasm of rectum: Secondary | ICD-10-CM | POA: Insufficient documentation

## 2020-10-22 DIAGNOSIS — Z1211 Encounter for screening for malignant neoplasm of colon: Secondary | ICD-10-CM | POA: Diagnosis present

## 2020-10-22 LAB — POC IMMUNOASSAY FECAL OCCULT BLOOD TEST: POC FECAL OCCULT BLOOD TEST (IMMUNOASSAY): POSITIVE — AB

## 2020-10-22 MED FILL — AMLODIPINE 5MG: 90 days supply | Qty: 90 | Fill #0 | Status: CP

## 2020-10-22 NOTE — Progress Notes (Signed)
Specimen drop off

## 2020-10-26 ENCOUNTER — Telehealth (HOSPITAL_BASED_OUTPATIENT_CLINIC_OR_DEPARTMENT_OTHER): Payer: Self-pay | Admitting: Internal Medicine

## 2020-10-26 DIAGNOSIS — R195 Other fecal abnormalities: Secondary | ICD-10-CM

## 2020-10-26 NOTE — Telephone Encounter (Signed)
Hello RNs    Please call to let her know that her IFOB was positive for blood      I have referred pt to have a colonoscopy    Thank you  Hoyle Sauer

## 2020-10-27 ENCOUNTER — Encounter (HOSPITAL_BASED_OUTPATIENT_CLINIC_OR_DEPARTMENT_OTHER): Payer: Self-pay

## 2020-10-27 ENCOUNTER — Ambulatory Visit (HOSPITAL_BASED_OUTPATIENT_CLINIC_OR_DEPARTMENT_OTHER): Payer: Self-pay

## 2020-10-27 ENCOUNTER — Ambulatory Visit (HOSPITAL_BASED_OUTPATIENT_CLINIC_OR_DEPARTMENT_OTHER): Payer: Self-pay | Admitting: Obstetrics & Gynecology

## 2020-10-27 NOTE — Progress Notes (Signed)
Oae Outreach:Patient will need cardiac clearance prior to scheduling her colonoscopy.Pcp+cardiology notified.

## 2020-10-28 ENCOUNTER — Encounter (HOSPITAL_BASED_OUTPATIENT_CLINIC_OR_DEPARTMENT_OTHER): Payer: Self-pay

## 2020-10-28 ENCOUNTER — Ambulatory Visit (HOSPITAL_BASED_OUTPATIENT_CLINIC_OR_DEPARTMENT_OTHER): Payer: Self-pay | Admitting: Surgery

## 2020-10-28 ENCOUNTER — Other Ambulatory Visit (HOSPITAL_BASED_OUTPATIENT_CLINIC_OR_DEPARTMENT_OTHER): Payer: Self-pay | Admitting: Internal Medicine

## 2020-10-28 DIAGNOSIS — E785 Hyperlipidemia, unspecified: Secondary | ICD-10-CM

## 2020-10-28 DIAGNOSIS — R232 Flushing: Secondary | ICD-10-CM

## 2020-10-28 MED FILL — OMEPRAZOLE 20MG: 90 days supply | Qty: 90 | Fill #2

## 2020-10-28 NOTE — Telephone Encounter (Signed)
PER Pharmacy, Tina [name] is a 65 year old female has requested a refill of Fluoxetine and Gemfibrozil.      Last Office Visit: 08/17/20 with [name]  Last Physical Exam: 10/27/16    There are no preventive care reminders to display for this patient.    Other Med Adult:  Most Recent BP Reading(s)  10/05/20 : 132/80        Cholesterol (mg/dL)   Date Value   04/26/2018 244 (*H)     LOW DENSITY LIPOPROTEIN DIRECT (mg/dL)   Date Value   04/26/2018 159     HIGH DENSITY LIPOPROTEIN (mg/dL)   Date Value   04/26/2018 58     TRIGLYCERIDES (mg/dL)   Date Value   04/26/2018 116         THYROID SCREEN TSH REFLEX FT4 (uIU/mL)   Date Value   12/20/2017 1.500         TSH (THYROID STIM HORMONE) (uIU/mL)   Date Value   09/23/2010 1.52       HEMOGLOBIN A1C (%)   Date Value   01/07/2018 5.6       No results found for: POCA1C      No results found for: INR    SODIUM (mmol/L)   Date Value   08/10/2020 141       POTASSIUM (mmol/L)   Date Value   08/10/2020 4.2           CREATININE (mg/dL)   Date Value   08/10/2020 0.8       Documented patient preferred pharmacies:    [facility], [address] [place]  Phone: [phone] Fax: [phone]

## 2020-10-28 NOTE — Telephone Encounter (Addendum)
Port Int ID [id number]    Spoke with the patient    Lab result reviewed    She will follow up with GI to have colonoscopy  I did provide office number to call and schedule this test.  She is leaving for 5 months to [place] to see family    FYI to PCP

## 2020-10-28 NOTE — Progress Notes (Signed)
[facility] Outreach:Sent: 10/27/2020 10:21 AM EDT   To: [name], APRN, Clinton Quant, MD   Subject: cardiac clearance                   [facility]ae Outreach:Patient will need cardiac clearance prior to scheduling her colonoscopy.Pcp+cardiology notified.Please advise.             Pat [place] Budiu, MD  Barbarann Ehlers, RN; [name], APRN  Hello,     Coronary CTA at [facility] ( 10/2020) showed minimal nonobstructive LAD disease, she should be all set for colonoscopy.   Oae letter mailed.

## 2020-10-29 ENCOUNTER — Ambulatory Visit: Payer: No Typology Code available for payment source | Attending: Internal Medicine

## 2020-10-29 ENCOUNTER — Other Ambulatory Visit: Payer: Self-pay

## 2020-10-29 VITALS — HR 73

## 2020-10-29 DIAGNOSIS — R0600 Dyspnea, unspecified: Secondary | ICD-10-CM | POA: Insufficient documentation

## 2020-10-29 DIAGNOSIS — R0609 Other forms of dyspnea: Secondary | ICD-10-CM

## 2020-10-29 MED ORDER — ALBUTEROL SULFATE HFA 108 (90 BASE) MCG/ACT IN AERS
4.0000 | INHALATION_SPRAY | Freq: Once | RESPIRATORY_TRACT | Status: AC | PRN
Start: 2020-10-29 — End: 2020-10-29
  Administered 2020-10-29: 4 via RESPIRATORY_TRACT

## 2020-10-29 MED ORDER — ALBUTEROL SULFATE HFA 108 (90 BASE) MCG/ACT IN AERS
4.0000 | INHALATION_SPRAY | Freq: Once | RESPIRATORY_TRACT | Status: DC | PRN
Start: 2020-10-29 — End: 2020-10-29

## 2020-10-29 MED FILL — FLUOXETINE 20MG CAPS: 90 days supply | Qty: 90 | Fill #0

## 2020-10-29 MED FILL — GEMFIBROZIL 600MG: 90 days supply | Qty: 180 | Fill #0

## 2020-11-01 ENCOUNTER — Ambulatory Visit (HOSPITAL_BASED_OUTPATIENT_CLINIC_OR_DEPARTMENT_OTHER): Payer: Self-pay | Admitting: Registered Nurse

## 2020-11-03 ENCOUNTER — Encounter (HOSPITAL_BASED_OUTPATIENT_CLINIC_OR_DEPARTMENT_OTHER): Payer: Self-pay

## 2020-11-03 ENCOUNTER — Ambulatory Visit (HOSPITAL_BASED_OUTPATIENT_CLINIC_OR_DEPARTMENT_OTHER): Admit: 2020-11-03 | Payer: Self-pay | Admitting: Obstetrics & Gynecology

## 2020-11-03 DIAGNOSIS — R0600 Dyspnea, unspecified: Secondary | ICD-10-CM

## 2020-11-03 DIAGNOSIS — N950 Postmenopausal bleeding: Secondary | ICD-10-CM | POA: Insufficient documentation

## 2020-11-03 SURGERY — HYSTEROSCOPY, WITH DILATION AND CURETTAGE OF UTERUS
Anesthesia: General | Site: Uterus

## 2020-11-03 NOTE — Procedures (Signed)
SUMMARY:  Spirometry:  Spirometry is normal.  The flow-volume loop appears normal.  There is   not a significant improvement in spirometry following administration of a   bronchodilator.  Lung volumes: The total lung capacity is elevated at 123% predicted  Diffusing capacity: Normal DLCO.  Pulse oximetry: The SpO2 is 97 % at rest on room air, and 98 % after 500 feet of   ambulation    INTERPRETATION:  Effectively normal pulmonary function.  The isolated elevation in total lung capacity   is probably not of clinical significance given otherwise normal metrics of lung   function.     No hypoxemia at rest or with exertion.  No prior studies for comparison.

## 2020-11-04 ENCOUNTER — Telehealth (HOSPITAL_BASED_OUTPATIENT_CLINIC_OR_DEPARTMENT_OTHER): Payer: Self-pay

## 2020-11-04 NOTE — Telephone Encounter (Signed)
Called patient with telephone interpreter, made her aware that her lung test came back normal.    Good news.    Patient denies any questions.      Clinton Quant, MD  [facility]  Cc: [name], APRN  Please call patient inform that the lung test came back normal.     Good news.

## 2020-11-09 ENCOUNTER — Other Ambulatory Visit (HOSPITAL_BASED_OUTPATIENT_CLINIC_OR_DEPARTMENT_OTHER): Payer: Self-pay | Admitting: Medical Specialties

## 2020-11-09 ENCOUNTER — Ambulatory Visit (HOSPITAL_BASED_OUTPATIENT_CLINIC_OR_DEPARTMENT_OTHER): Payer: No Typology Code available for payment source | Admitting: Orthopaedic Surgery

## 2020-11-09 ENCOUNTER — Ambulatory Visit
Admission: RE | Admit: 2020-11-09 | Discharge: 2020-11-09 | Disposition: A | Discharge status: Home / Self Care | Payer: No Typology Code available for payment source | Attending: Orthopaedic Surgery | Admitting: Orthopaedic Surgery

## 2020-11-09 ENCOUNTER — Other Ambulatory Visit: Payer: Self-pay

## 2020-11-09 DIAGNOSIS — M654 Radial styloid tenosynovitis [de Quervain]: Secondary | ICD-10-CM

## 2020-11-09 DIAGNOSIS — M25532 Pain in left wrist: Secondary | ICD-10-CM

## 2020-11-09 DIAGNOSIS — M19032 Primary osteoarthritis, left wrist: Secondary | ICD-10-CM | POA: Insufficient documentation

## 2020-11-09 IMAGING — MR RM OMBRO DIREITO
4 of 5 series · 16 of 40 positions shown · non-contrast
Comparison: none

[Series 5001: T2 · axial · 3.9mm · 0.31mm/px · z∈[-37,+42]mm · 3 of 24 slices shown]
[im 3/24]
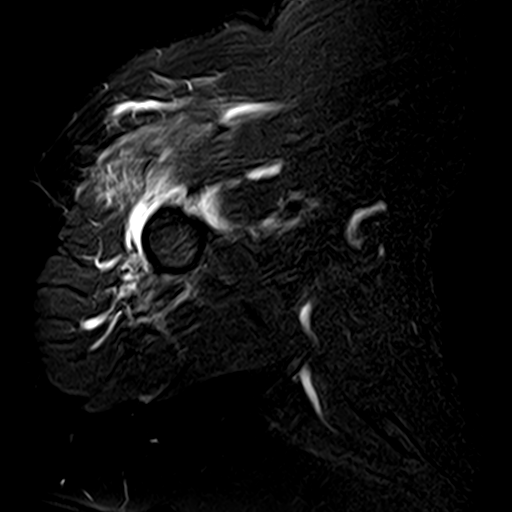
[im 13/24]
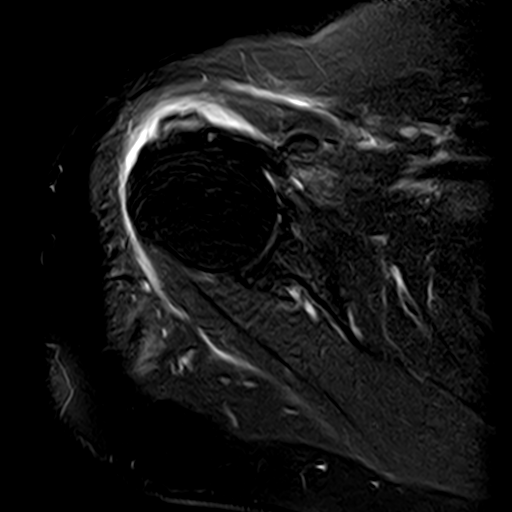
[im 21/24]
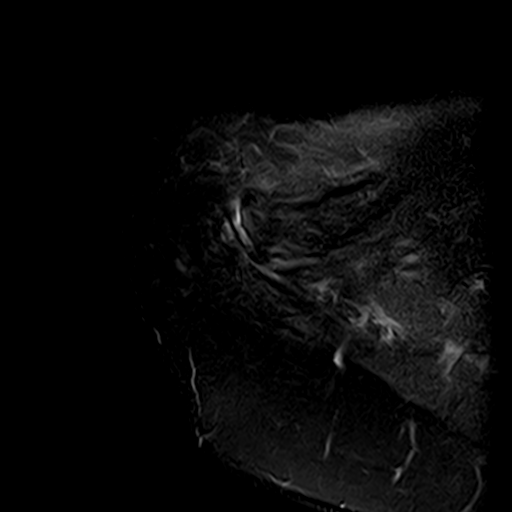

[Series 6001: T1 · oblique · 3.5mm · 0.25mm/px · 3 of 16 slices shown (1 of 2)]
[im 3/16]
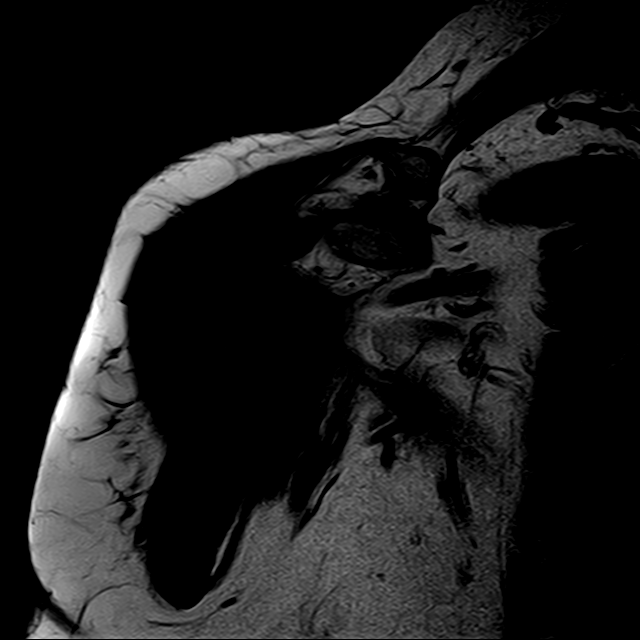
[im 8/16]
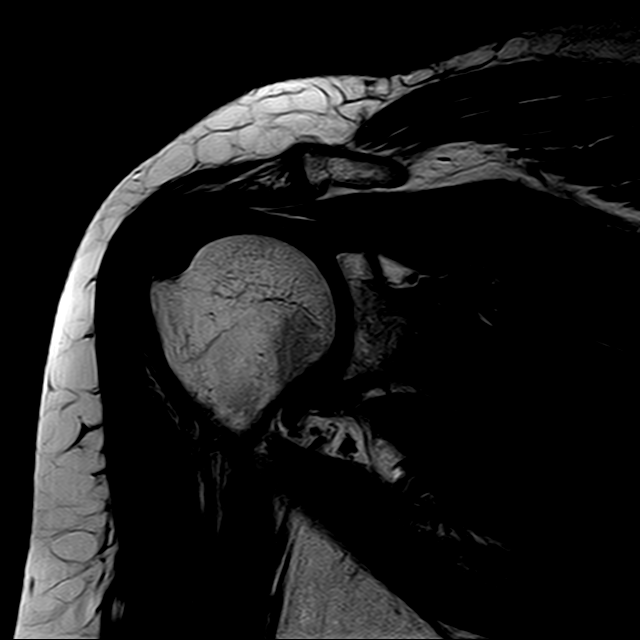
[im 13/16]
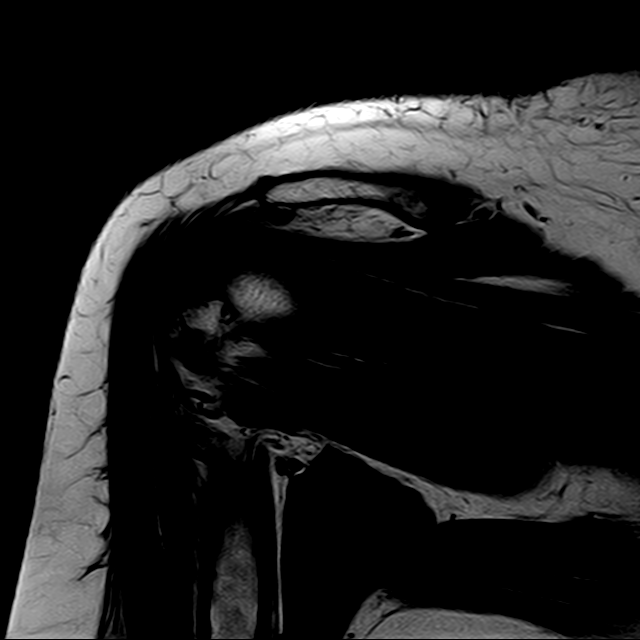

[Series 7001: T2 fat-sat · oblique · 3.5mm · 0.31mm/px · 7 of 16 slices shown]
[im 1/16]
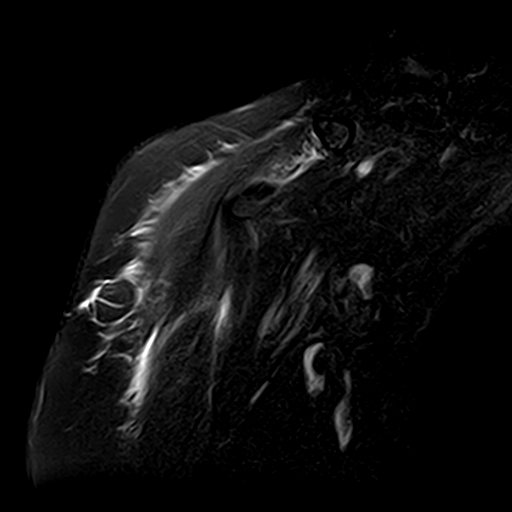
[im 3/16]
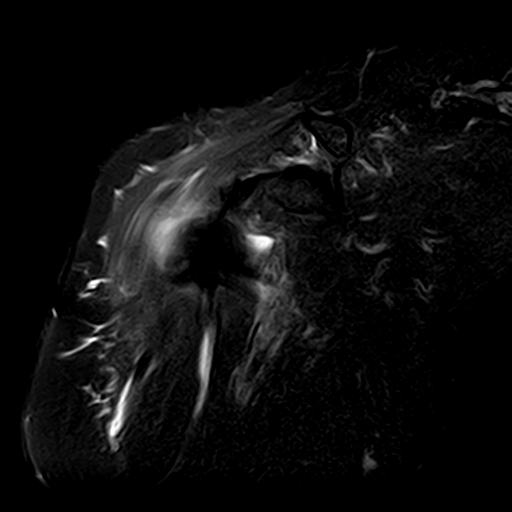
[im 6/16]
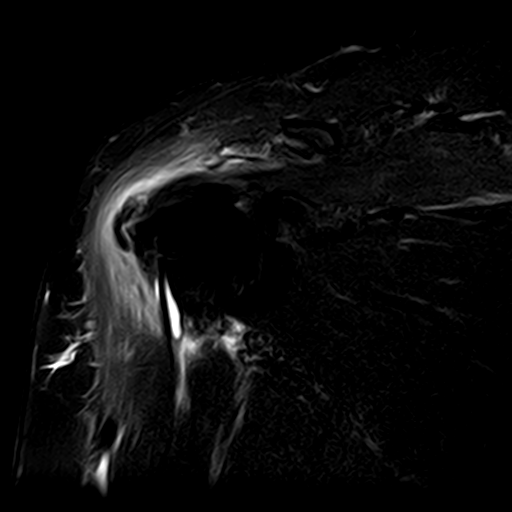
[im 8/16]
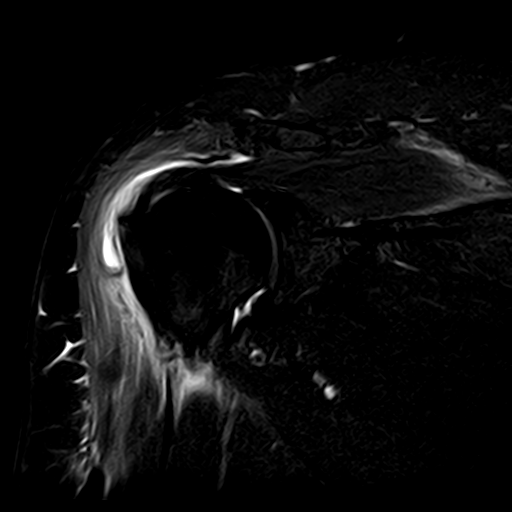
[im 11/16]
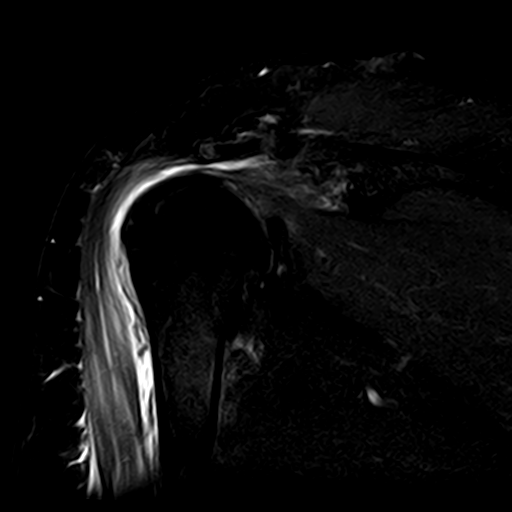
[im 13/16]
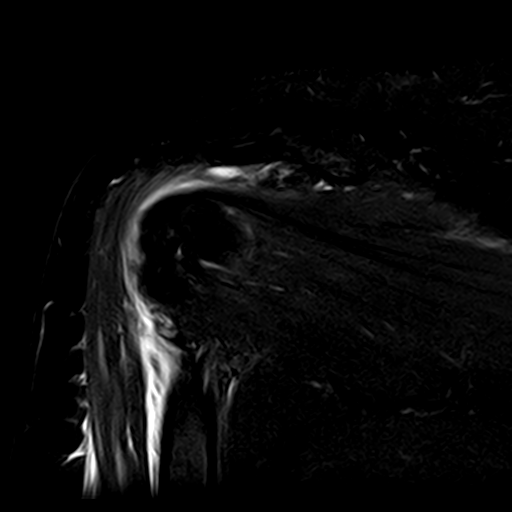
[im 16/16]
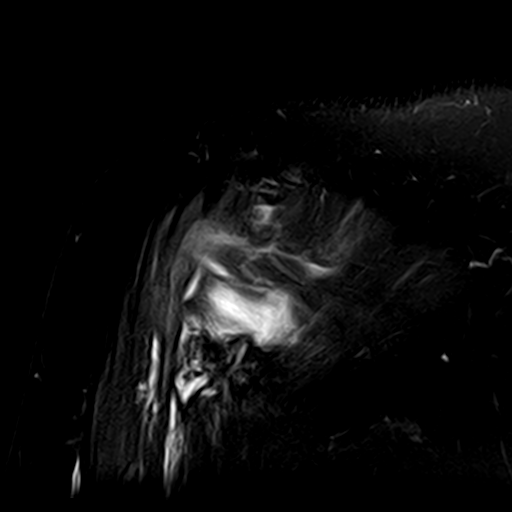

[Series 9001: T1 · oblique · 3.5mm · 0.26mm/px · 3 of 20 slices shown (2 of 2)]
[im 3/20]
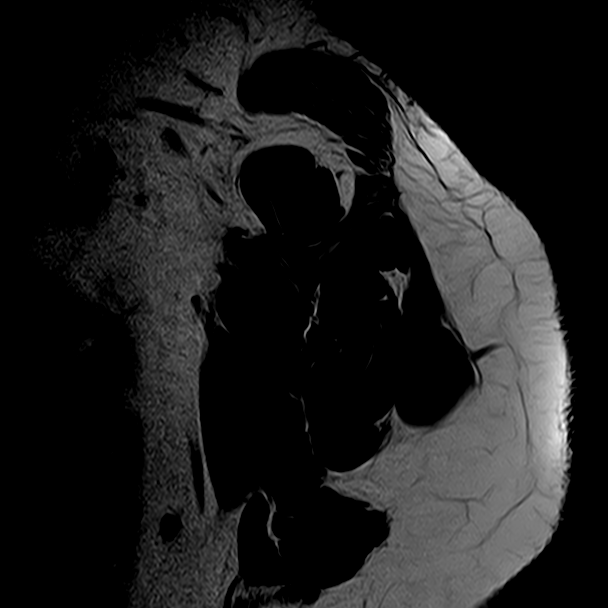
[im 11/20]
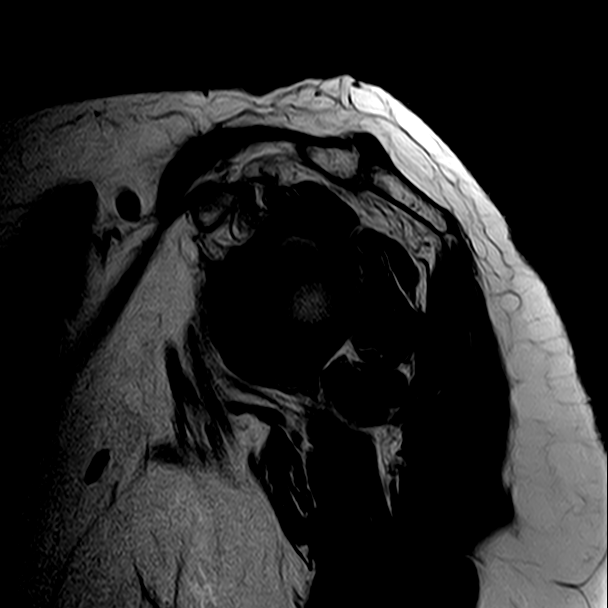
[im 17/20]
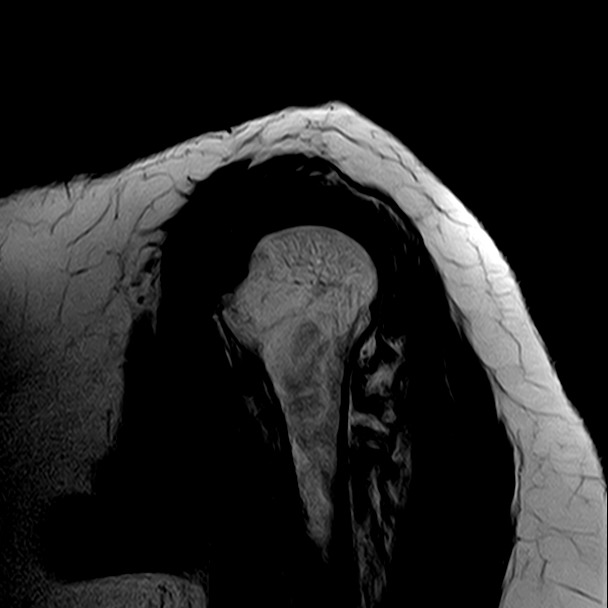

[16 of 40 positions shown; findings below may reference images not displayed]

RM OMBRO DIREITO
                                 RESSONÂNCIA MAGNÉTICA DO OMBRO DIREITO
TÉCNICA DE EXAME
 Realizadas sequências FSE com cortes multiplanares.
Indicação do Exame
 Dor, edema e limitação de movimentos há 1 semana.
ANÁLISE
Artropatia degenerativa em grau leve  acromioclavicular associada a espessamento capsulossinovial.
Tendinopatia leve/moderada do supraespinal e das fibras superiores do infraespinal, com roturas intrassubstanciais
e insercionais mais evidentes no feixe anterior do supraespinal que acometem menos de 50% de sua espessura,
sem retrações significativas ou transfixações. 
Bursite subacromial / subdeltóidea, notando-se imagem de baixo sinal em todas as sequências avaliadas em seu
interior medindo  aproximadamente 1,7 cm no maior eixo sugestiva de calcificação. A possibilidade de tendinopatia
calcária do supraespinal  migratória/reabsortiva para a bursa subacromial / subdeltóidea  deve ser considerada.
Acrômio reto sem inclinações laterais anômalas.
Espessamento do ligamento coracoumeral, edema pericapsular e obliteração do intervalo rotador anterior e
espessamento capsuloligamentar no recesso axilar sugestivo de capsulite  adesiva, a correlacionar com o exame
físico.
Tendinopatia  leve  do subescapular com roturas intrassubstanciais e insercionais nas fibras superiores que
acometem um  terço de sua espessura, sem retrações ou transfixações.
Tendinopatia  leve  no  segmento  intra-articular  do  cabo  longo  do  bíceps,  mantendo-se  tópico  no  sulco
intertubercular.
Tendão do redondo menor com morfologia e sinal normais.
Rotura na base de implantação no  segmento posterossuperior e degeneração do segmento superior labral do
lábio glenoidal sem destacamentos francos.
Afilamento condral irregular no compartimento glenoumeral sem exposição do  osso subcondral.
Estrias de lipossubstituição nos ventres musculares do manguito rotador (Goutallier I).
IMPRESSÃO DIAGNÓSTICA

RM OMBRO DIREITO
Artrose acromiocolavicular e glenoumeral.
Bursite  subacromial/subdeltóidea  com  calcificação  em  seu  interior  sugerindo   migração  /  reabsorção  de
tendinopatia  calcária  do  supraespinal,
Tendinopatia do supraespinal, infraespinal e subescapular cujas demais características foram acima descritas.
Tendinopatia no segmento intra-articular do cabo longo do bíceps.
Sinais sugestivos de  capsulite adesiva a correlacionar com dados do exame físico.

## 2020-11-09 MED ORDER — DICLOFENAC SODIUM 1 % EX GEL
4.00 g | Freq: Two times a day (BID) | CUTANEOUS | 1 refills | Status: AC | PRN
Start: 2020-11-09 — End: 2020-12-09

## 2020-11-09 MED ORDER — METHYLPREDNISOLONE ACETATE 20 MG/ML IJ SUSP
20.00 mg | Freq: Once | INTRAMUSCULAR | 0 refills | Status: AC
Start: 2020-11-09 — End: 2020-11-09

## 2020-11-09 MED FILL — DICLOFENAC GEL 1%: 14 days supply | Qty: 100 | Fill #0 | Status: CP

## 2020-11-09 NOTE — Progress Notes (Signed)
Date of service: 11/09/2020    The patient returns for follow up of her left hand and wrist pain..    She reports that she still having pain along the thumb radial volar forearm.  She did get a thumb spica brace which does help.  She has been taking meloxicam with equivocal efficacy.  She has been using diclofenac gel which was helpful but ran out.  She is going to be traveling to [place] to see her daughter for the next 4 months, leaving later this week    Physical examination:   General - the patient is alert and oriented x3, well appearing, in no acute distress.  Normal affect and mood.  Normal pupil dilation, extraocular motions intact  Nonlabored breathing  Left wrist inform demonstrates skin intact.  There is tenderness over the first extensor compartment.  Wrist extension 30, flexion 50.  Finkelstein's test positive.  There is pain with resisted thumb extension and abduction.  Grip strength 5/5 due to pain.  Sensation intact.  Radial pulse 2+    Radiographs of the left wrist are personally reviewed.  Normal alignment, no bony abnormality or prominent degenerative changes    Assessement: Left wrist de Quervain's tenosynovitis.    Plan: I reviewed the findings with the patient via telephone interpreter.  Her symptoms are improved by using a brace.  She did not schedule therapy and will be traveling out of town for the next few months.  I did again review with her home stretching program and recommended continuing ice.  I did refill her diclofenac gel.  I did offer her a cortisone injection, for which she wished to proceed. A time out was held confirming the site of injection and laterality. After verbal consent and sterile prep, the left wrist first extensor compartment was injected with a combination of 20 mg of methylprednisolone and 0.5 cc of 1% lidocaine .  A bandage was applied.  There were no complications.  I advised the patient to ice the area down later today.  If she is still having significant  symptoms when she gets back in town, I would recommend she see Dr. Joylene John, our hand specialist.  She will follow-up with me as needed.    All questions were answered.    This note was prepared using voice recognition software.  Please disregard any transcription errors.

## 2020-11-09 NOTE — Patient Instructions (Signed)
Put an ice pack on your wrist at the injection site for 10-15 minutes later today.

## 2020-11-11 ENCOUNTER — Ambulatory Visit (HOSPITAL_BASED_OUTPATIENT_CLINIC_OR_DEPARTMENT_OTHER): Payer: Self-pay | Admitting: Surgery

## 2020-11-11 MED FILL — METOPROL SUC 25MG ER: 90 days supply | Qty: 90 | Fill #1 | Status: CP

## 2020-11-22 ENCOUNTER — Ambulatory Visit (HOSPITAL_BASED_OUTPATIENT_CLINIC_OR_DEPARTMENT_OTHER): Payer: Self-pay | Admitting: Internal Medicine

## 2020-11-26 ENCOUNTER — Ambulatory Visit (HOSPITAL_BASED_OUTPATIENT_CLINIC_OR_DEPARTMENT_OTHER): Payer: Self-pay | Admitting: Obstetrics & Gynecology

## 2021-05-02 ENCOUNTER — Other Ambulatory Visit (HOSPITAL_BASED_OUTPATIENT_CLINIC_OR_DEPARTMENT_OTHER): Payer: Self-pay

## 2021-11-08 ENCOUNTER — Encounter (HOSPITAL_BASED_OUTPATIENT_CLINIC_OR_DEPARTMENT_OTHER): Payer: Self-pay | Admitting: Family Medicine

## 2021-11-08 ENCOUNTER — Other Ambulatory Visit: Payer: Self-pay

## 2021-11-08 ENCOUNTER — Ambulatory Visit: Payer: No Typology Code available for payment source | Attending: Family Medicine | Admitting: Family Medicine

## 2021-11-08 VITALS — BP 133/69 | HR 64 | Wt 159.0 lb

## 2021-11-08 DIAGNOSIS — G8929 Other chronic pain: Secondary | ICD-10-CM | POA: Diagnosis present

## 2021-11-08 DIAGNOSIS — I10 Essential (primary) hypertension: Secondary | ICD-10-CM | POA: Diagnosis present

## 2021-11-08 DIAGNOSIS — E785 Hyperlipidemia, unspecified: Secondary | ICD-10-CM | POA: Insufficient documentation

## 2021-11-08 DIAGNOSIS — M25512 Pain in left shoulder: Secondary | ICD-10-CM | POA: Diagnosis present

## 2021-11-08 DIAGNOSIS — Z1329 Encounter for screening for other suspected endocrine disorder: Secondary | ICD-10-CM | POA: Insufficient documentation

## 2021-11-08 DIAGNOSIS — M65311 Trigger thumb, right thumb: Secondary | ICD-10-CM | POA: Insufficient documentation

## 2021-11-08 DIAGNOSIS — Z130 Encounter for screening for diseases of the blood and blood-forming organs and certain disorders involving the immune mechanism: Secondary | ICD-10-CM | POA: Insufficient documentation

## 2021-11-08 DIAGNOSIS — E559 Vitamin D deficiency, unspecified: Secondary | ICD-10-CM | POA: Diagnosis present

## 2021-11-08 DIAGNOSIS — R232 Flushing: Secondary | ICD-10-CM | POA: Insufficient documentation

## 2021-11-08 DIAGNOSIS — Z131 Encounter for screening for diabetes mellitus: Secondary | ICD-10-CM | POA: Insufficient documentation

## 2021-11-08 DIAGNOSIS — M65331 Trigger finger, right middle finger: Secondary | ICD-10-CM | POA: Diagnosis present

## 2021-11-08 DIAGNOSIS — K648 Other hemorrhoids: Secondary | ICD-10-CM | POA: Diagnosis present

## 2021-11-08 DIAGNOSIS — M654 Radial styloid tenosynovitis [de Quervain]: Secondary | ICD-10-CM | POA: Diagnosis present

## 2021-11-08 LAB — CBC, PLATELET & DIFFERENTIAL
ABSOLUTE BASO COUNT: 0 10*3/uL (ref 0.0–0.1)
ABSOLUTE EOSINOPHIL COUNT: 0.3 10*3/uL (ref 0.0–0.8)
ABSOLUTE IMM GRAN COUNT: 0.01 10*3/uL (ref 0.00–0.10)
ABSOLUTE LYMPH COUNT: 2.2 10*3/uL (ref 0.6–5.9)
ABSOLUTE MONO COUNT: 0.6 10*3/uL (ref 0.2–1.4)
ABSOLUTE NEUTROPHIL COUNT: 3.4 10*3/uL (ref 1.6–8.3)
ABSOLUTE NRBC COUNT: 0 10*3/uL (ref 0.0–0.0)
BASOPHIL %: 0.3 % (ref 0.0–1.2)
EOSINOPHIL %: 4 % (ref 0.0–7.0)
HEMATOCRIT: 38.4 % (ref 34.1–44.9)
HEMOGLOBIN: 12.2 g/dL (ref 11.2–15.7)
IMMATURE GRANULOCYTE %: 0.2 % (ref 0.0–1.0)
LYMPHOCYTE %: 34.3 % (ref 15.0–54.0)
MEAN CORP HGB CONC: 31.8 g/dL (ref 31.0–37.0)
MEAN CORPUSCULAR HGB: 31.3 pg (ref 26.0–34.0)
MEAN CORPUSCULAR VOL: 98.5 fl (ref 80.0–100.0)
MEAN PLATELET VOLUME: 12.6 fL — ABNORMAL HIGH (ref 8.7–12.5)
MONOCYTE %: 8.6 % (ref 4.0–13.0)
NEUTROPHIL %: 52.6 % (ref 40.0–75.0)
NRBC %: 0 % (ref 0.0–0.0)
PLATELET COUNT: 248 10*3/uL (ref 150–400)
RBC DISTRIBUTION WIDTH STD DEV: 46.4 fL — ABNORMAL HIGH (ref 35.1–46.3)
RED BLOOD CELL COUNT: 3.9 M/uL (ref 3.90–5.20)
WHITE BLOOD CELL COUNT: 6.5 10*3/uL (ref 4.0–11.0)

## 2021-11-08 LAB — COMPREHENSIVE METABOLIC PANEL
ALANINE AMINOTRANSFERASE: 40 U/L (ref 12–45)
ALBUMIN: 4.3 g/dL (ref 3.4–5.2)
ALKALINE PHOSPHATASE: 99 U/L (ref 45–117)
ANION GAP: 12 mmol/L (ref 10–22)
ASPARTATE AMINOTRANSFERASE: 29 U/L (ref 8–34)
BILIRUBIN TOTAL: 0.3 mg/dL (ref 0.2–1.0)
BUN (UREA NITROGEN): 14 mg/dL (ref 7–18)
CALCIUM: 9.6 mg/dL (ref 8.5–10.5)
CARBON DIOXIDE: 28 mmol/L (ref 21–32)
CHLORIDE: 104 mmol/L (ref 98–107)
CREATININE: 0.8 mg/dL (ref 0.4–1.2)
ESTIMATED GLOMERULAR FILT RATE: >60 mL/min (ref >60)
Glucose Random: 102 mg/dL (ref 74–160)
POTASSIUM: 4.6 mmol/L (ref 3.5–5.1)
SODIUM: 143 mmol/L (ref 136–145)
TOTAL PROTEIN: 6.5 g/dL (ref 6.4–8.2)

## 2021-11-08 LAB — HEMOGLOBIN A1C
ESTIMATED AVERAGE GLUCOSE: 131 mg/dL (ref 74–160)
HEMOGLOBIN A1C: 6.2 % — ABNORMAL HIGH (ref 4.0–5.6)

## 2021-11-08 LAB — THYROID SCREEN TSH REFLEX FT4: THYROID SCREEN TSH REFLEX FT4: 1.55 u[IU]/mL (ref 0.270–4.200)

## 2021-11-08 MED ORDER — CHOLECALCIFEROL 50 MCG (2000 UT) PO TABS
2000.00 [IU] | ORAL_TABLET | Freq: Every day | ORAL | 3 refills | Status: AC
Start: 2021-11-08 — End: 2022-11-08

## 2021-11-08 MED ORDER — MELOXICAM 7.5 MG PO TABS
7.5000 mg | ORAL_TABLET | Freq: Every day | ORAL | 0 refills | Status: DC
Start: 2021-11-08 — End: 2022-08-09

## 2021-11-08 MED ORDER — FLUOXETINE HCL 20 MG PO CAPS
20.00 mg | ORAL_CAPSULE | Freq: Every day | ORAL | 3 refills | Status: AC
Start: 2021-11-08 — End: 2022-11-08

## 2021-11-08 MED ORDER — HYDROCORTISONE ACETATE 25 MG PR SUPP
25.00 mg | Freq: Two times a day (BID) | RECTAL | 2 refills | Status: AC
Start: 2021-11-08 — End: 2021-11-13

## 2021-11-08 MED ORDER — VALSARTAN 40 MG PO TABS
40.00 mg | ORAL_TABLET | Freq: Every day | ORAL | 3 refills | Status: AC
Start: 2021-11-08 — End: 2022-11-08

## 2021-11-08 MED ORDER — AMLODIPINE BESYLATE 5 MG PO TABS
5.00 mg | ORAL_TABLET | Freq: Every day | ORAL | 3 refills | Status: AC
Start: 2021-11-08 — End: 2022-11-08

## 2021-11-08 MED ORDER — ATORVASTATIN CALCIUM 80 MG PO TABS
80.00 mg | ORAL_TABLET | Freq: Every day | ORAL | 3 refills | Status: AC
Start: 2021-11-08 — End: 2022-11-08

## 2021-11-08 MED ORDER — METOPROLOL SUCCINATE ER 25 MG PO TB24
25.0000 mg | ORAL_TABLET | Freq: Every day | ORAL | 3 refills | Status: DC
Start: 2021-11-08 — End: 2022-02-22

## 2021-11-08 MED FILL — VITAMIN D 50MCG (D3): 90 days supply | Qty: 90 | Fill #0

## 2021-11-08 MED FILL — HYDROCORT AC SUP 25MG: 5 days supply | Qty: 10 | Fill #0

## 2021-11-08 MED FILL — METOPROL SUC 25MG ER: 90 days supply | Qty: 90 | Fill #0

## 2021-11-08 MED FILL — ATORVASTATIN 80MG: 90 days supply | Qty: 90 | Fill #0

## 2021-11-08 MED FILL — FLUOXETINE 20MG CAPS: 90 days supply | Qty: 90 | Fill #0

## 2021-11-08 MED FILL — AMLODIPINE 5MG: 90 days supply | Qty: 90 | Fill #0

## 2021-11-08 MED FILL — VALSARTAN 40MG: 90 days supply | Qty: 90 | Fill #0

## 2021-11-08 MED FILL — MELOXICAM 7.5MG: 30 days supply | Qty: 30 | Fill #0

## 2021-11-08 NOTE — Progress Notes (Signed)
S:  Tina [name] is a 66 year old female here with several issues.    She has had problems with hemorrhoids for several years.  They flare up intermittently.  She was in [place] recently visiting her daughter. Had been there for about 6-9 months.  She was seen there for this while there and was prescribed HC cream topically.  2 weeks ago had bleeding with BM. She had this with a soft stool.   She was not straining.  The MD in [place] said she didn't need urgery      Right 3rd finger trigger finger and right trigger thumb    Let shoulder pain  No nubness.tingling/weakness in the UE's  BP 133/69 (Site: LA, Position: Sitting, Cuff Size: Lrg)   Pulse 64   Wt 72.1 kg (159 lb)   LMP 06/08/2008   SpO2 98%   BMI 29.07 kg/m   BP left 132/78  Right 134/78    O:  PHYSICAL EXAM  General: well appearing, in no distress  Vitals: vital signs reviewed-see nurses notes  HEENT: Eyes: sclera clear  Neck: supple, no LAD; thyroid not enlarged.  Lungs: clear to auscultation, without rales, rhonci or wheezing  Cardiovascular: Regular rhythm, normal sounds and absence of murmurs, rubs or gallops  Extremities: Non edematous    Pain let wrist radial aspect good csm fingers 2+/4+

## 2021-11-11 ENCOUNTER — Encounter (HOSPITAL_BASED_OUTPATIENT_CLINIC_OR_DEPARTMENT_OTHER): Payer: Self-pay | Admitting: Family Medicine

## 2021-11-24 ENCOUNTER — Encounter (HOSPITAL_BASED_OUTPATIENT_CLINIC_OR_DEPARTMENT_OTHER): Payer: Self-pay

## 2021-11-24 ENCOUNTER — Other Ambulatory Visit (HOSPITAL_BASED_OUTPATIENT_CLINIC_OR_DEPARTMENT_OTHER): Payer: Self-pay

## 2021-11-24 NOTE — Telephone Encounter (Signed)
Error

## 2021-11-24 NOTE — Progress Notes (Signed)
Returned patients call to schedule a colonoscopy.  Pt states that she is away in February and doesn't want to wait until after her vacation to get this procedure.  Pt wants to call her doctor and will call back.

## 2021-12-05 ENCOUNTER — Ambulatory Visit (HOSPITAL_BASED_OUTPATIENT_CLINIC_OR_DEPARTMENT_OTHER): Payer: Self-pay

## 2021-12-05 ENCOUNTER — Other Ambulatory Visit: Payer: Self-pay

## 2021-12-05 ENCOUNTER — Ambulatory Visit: Payer: No Typology Code available for payment source | Attending: Family Medicine | Admitting: Family Medicine

## 2021-12-05 ENCOUNTER — Encounter (HOSPITAL_BASED_OUTPATIENT_CLINIC_OR_DEPARTMENT_OTHER): Payer: Self-pay | Admitting: Family Medicine

## 2021-12-05 ENCOUNTER — Other Ambulatory Visit (HOSPITAL_BASED_OUTPATIENT_CLINIC_OR_DEPARTMENT_OTHER): Payer: Self-pay

## 2021-12-05 VITALS — BP 155/88 | HR 84 | Temp 101.4°F | Wt 155.0 lb

## 2021-12-05 DIAGNOSIS — U071 COVID-19: Secondary | ICD-10-CM | POA: Insufficient documentation

## 2021-12-05 DIAGNOSIS — R509 Fever, unspecified: Secondary | ICD-10-CM | POA: Insufficient documentation

## 2021-12-05 DIAGNOSIS — R519 Headache, unspecified: Secondary | ICD-10-CM | POA: Diagnosis present

## 2021-12-05 DIAGNOSIS — R52 Pain, unspecified: Secondary | ICD-10-CM

## 2021-12-05 DIAGNOSIS — R051 Acute cough: Secondary | ICD-10-CM | POA: Diagnosis present

## 2021-12-05 MED ORDER — IBUPROFEN 800 MG PO TABS
800.00 mg | ORAL_TABLET | Freq: Four times a day (QID) | ORAL | 1 refills | Status: AC | PRN
Start: 2021-12-05 — End: 2021-12-19

## 2021-12-05 MED ORDER — NIRMATRELVIR&RITONAVIR 300/100 20 X 150 MG & 10 X 100MG PO TBPK
ORAL_TABLET | ORAL | 0 refills | Status: AC
Start: 2021-12-05 — End: 2021-12-10

## 2021-12-05 MED ORDER — KETOROLAC TROMETHAMINE 15 MG/ML (FOR FAM USE)
15.0000 mg | Freq: Once | INTRAMUSCULAR | Status: DC
Start: 2021-12-05 — End: 2021-12-05

## 2021-12-05 MED ORDER — KETOROLAC TROMETHAMINE 30 MG/ML (FOR FAM USE)
30.00 mg | Freq: Once | INTRAMUSCULAR | Status: AC
Start: 2021-12-05 — End: 2021-12-05
  Administered 2021-12-05: 30 mg via INTRAMUSCULAR

## 2021-12-05 MED FILL — IBUPROFEN 800MG: 15 days supply | Qty: 60 | Fill #0 | Status: CP

## 2021-12-05 MED FILL — PAXLOVID 300-100: 5 days supply | Qty: 30 | Fill #0 | Status: CP

## 2021-12-05 NOTE — Telephone Encounter (Signed)
Reason for Disposition  . Continuous (nonstop) coughing interferes with work or school and no improvement using cough treatment per Care Advice    Answer Assessment - Initial Assessment Questions  Patient c/o dry cough and runny nose x 2 days  -fever x 2 days with a temp of 102.2 F, patient is taking tylenol with mild relief  -bodyache  -headaches  -Chest discomfort due to coughing  -upper back pain due to coughing  -patient reports coughing spells lasting for 30 seconds, cough is worse at night and interferes with patients sleepy    Patient denies hemoptysis, SOB, difficulty with breathing, N/V/D, loss of taste or smell, wheezing or chest pain.  Patient denies any history of heart disease, heart attack, congestive heart failure, pulmonary embolus, asthma, emphysema, blood clots, recent major surgery, recent prolonged travel or bedridden, denies recent exposure and has gone through menopause.     Patient denies taking any OTC medication for cough or symptom relief.  Home care advice discussed with patient, advise patient to drink plenty of fluids, warm tea, Mucinex or robitussin for cough, multi-vitamins to help boost immune system and to get plenty of rest. Covid precautions discussed with patient.    Advised per nursing triage protocol.  Verbalized understanding and agreement with instructions and disposition.     Recommended disposition for patient:Disposition: See in Office Today    If patient referred to UC/ED advised that they may require further follow up and testing after the visit with their primary care office.     Instructed patient to call back for any new, worsening, or worrisome symptoms or concerns any time day or night.    Protocols used: ADULT COUGH-A-OH

## 2021-12-05 NOTE — Progress Notes (Unsigned)
Toradol '30mg'$  given on Lt gluteal fold per order of Provider. Patient tolerated well. Instructed to remain 15 15 minutes for observation- pt agrees    Tressie Ellis, LPN

## 2021-12-05 NOTE — Telephone Encounter (Signed)
Regarding: fever, cough, body pain, chest discomfort.  ----- Message from Royetta Car sent at 12/05/2021  8:43 AM EDT -----  Tina [name] [id number], 66 year old, female    Calls today:  Clinical Questions (NON-SICK CLINICAL QUESTIONS ONLY)    Name of person calling pt   Specific nature of request fever, cough, body pain, chest discomfort.  Return phone number [phone]    Person calling on behalf of patient: Patient (self)    CALL BACK NUMBER:   Best time to call back:   Cell phone:   Other phone:    Patient's language of care: [name]    Patient needs a [name] interpreter.    Patient's PCP: [name], Carolyn, APRN    Primary Care Home Site:  [facility]

## 2021-12-05 NOTE — Progress Notes (Signed)
Chief Complaint:Patient presents with:  Cough  Headache      Language: [place]    HPI  Tina [name] is a 66 year old female with history of HTN who presents to the clinic due to cough, fevers, headache, body pains:      Description of symptoms:    - headache, back pain  - Tylenol 1000 mg  not helping  - worsened last night  - non productive cough  - rhinorrhea clear  - denies sick contacts, no one she knows has COVID  - works as [employer] administrator      First day of symptoms:  - began Saturday at night     Pertinent history: Cardiovascular disease (e.g. CAD, CHF, PAD) or hypertension: Hypertension          ROS    ROS as per documented in HPI. All other systems reviewed and negative.     Objective:     Vitals  BP 155/88 (Site: LA, Position: Sitting, Cuff Size: Reg)   Pulse 84   Temp 101.4 F (38.6 C) (Oral)   Wt 70.3 kg (155 lb)   LMP 06/08/2008   SpO2 98%   BMI 28.34 kg/m   Pain Score: Data Unavailable          Physical Exam  General: Patient appears in acute distress, coughing in pain  Psych: Judgement and insight appropriate; oriented to person, place and time; memory intact; affect appears normal  Heart: PMI normal. No lifts, heaves, or thrills. RRR. No murmurs, clicks, gallops or rubs. Carotids and DP pulses normal. No edema.  Lung: Unable to take deep breathes due to coughing, shallow breaths, but per my auscultation o dullness, wheezing or crackles, and good air movement bilaterally.      Assessment and Plan        1. SARS-CoV-2 positive 2. Acute cough  3. Fever and chills    Rapid antigen test in office positive for COVID    Counseling and care:    Home care advice:     Self isolation:   Stay home for a minimum of 5 days and until afebrile and symptoms improving, and then strict masking in public through day 10 (no restaurants or air travel). If you have worsening symptoms particularly shortness of breath or continued fevers you may need to isolate longer. : Yes  Minimum last day of isolation:  Thursday     Contacts:  All close contacts (household members, people with whom you spent time up to 2 days before developed symptoms or tested) should be tested:   Yes  All close contacts should wear a mask around others for 10 days: Yes     Treatment: If the patient has a condition predisposing to severe disease (refer to the COVID therapies guide) : Would you be interested in being considered for treatment with a medication or infusion to prevent severe disease? Yes -- use [facility]     4.   Offered letter for work or school and sent via my chart or routed to home administrative pool: Yes     5.   Advised patient that if they develop new symptoms,  should call their PCP or go to ED in an emergency: Yes     Disposition: Anticipatory guidance only, chart routed to PCP           COUNSELING AND COORDINATION OF CARE FOR COVID TREATMENT COURSE      The following options are available, in order of  recommendation:  Paxlovid (PO, within 5 days of symptom start)  Molnupriavir (PO, within 5 days of symptom start)  Remdesivir (IV, 3 day course, within 7 days of symptom start)    I have discussed the following with the patient:  These medicines may benefit some people with COVID from progressing to severe disease Yes  Covid therapies that may be offered as an Emergency Use Authorization and may not have full FDA approval. Yes     Details about the medication and side effects are:     Paxlovid- Oral medication has many drug-drug interactions (these should be reviewed in detail) and needs to be renally adjusted for GFR <60. Side effects include allergic reaction and  occasional nausea, diarrhea and metallic taste. 5-10% may experience rebound of symptoms D10-16. Patient would need to restart isolation if occurs.      Remdesivir- Patient would need 3 injections of Remdesivir. Side effects include: allergic reaction, site reaction, occasional nausea, reversible liver enzyme elevation.     Molnupriavir- Oral medication cannot  be used by breastfeeding mothers or those who are pregnant or can become pregnant. Women should use contraception for at least 4 days after course and men for 3 months after course. Side effects also include allergic reactions.     We do not know all of the possible side effects as these medicines are still being studied. Yes     If referred for infusion you may be offered a different treatment based on medication supply. They will talk to you about the medication change if this occurs.      If the patient is pregnant or breastfeeding we recommend Paxlovid or Remdesivir: There is limited experience giving these medicines to pregnant people. However, pregnant people are known to be at greater risk for severe disease with COVID.     I have sent the patient the FDA EUA Fact Sheet by MyChart or given them the link (these documents in the major 4 languages are available in the [facility] Covid Handbook): NO     Paxlovid: http://galloway.com/    The patient is interested in COVID therapy.      If referring to the [facility] pharmacy, (preferred for Paxlovid and Molunipiravir) :   The med will default to "Transcribe for Prior Auth". This the alerts Central Refill (7 days a week) to outreach to the patient to counsel the patient and arrange for medication pick up or delivery.      - nirmatrelvir-ritonavir (PAXLOVID) 300 & 100 mg tablet therapy pack; Take 300 mg nirmatrelvir (two 150 mg pink tabs) with 100 mg ritonavir (one 100 mg white tab), with all three tablets taken together twice daily for 5 days  Dispense: 30 tablet; Refill: 0      4. Headache disorder  5. Complaints of total body pain  Patient with headache worse with coughing and requesting pain med so have given Toradol - she did feel much better afterwards  - ketorolac (TORADOL) injection 30 mg    I spent a total of 40 minutes on this visit on the date of service (total time includes all activities performed on the date of service)        Patient and/or  guardian expresses understanding and agreement with the above plan.     Darnelle Going, MD, 12/05/2021

## 2021-12-05 NOTE — Patient Instructions (Signed)
Workflow Title: Home Care   Date Last Updated: 12/24/2020   Created By: [email or web address]   Intended For: Staff providing patients counseling about COVID at home   Addendum Info: Duration of isolation        COVID-19 Home Care Phone Instructions     The following instructions are intended for patients who have been tested for COVID or who have not been tested but are symptomatic and therefore treated as presumptive COVID.     Treating symptoms and anticipatory guidance:    Covid specific therapies are currently widely available and should be encouraged in patients with risk factors to progress to severe disease.   Continue to take your prescribed medicines.  Do not take other medicines unless they have been prescribed.  Rest and take plenty of fluids.  Use Tylenol rather than ibuprofen or other NSAIDs. NSAIDs OK but Tylenol preferred.  Patients with worsening symptoms should:  Call their clinic phone number.   Call 911 in an emergency.     Isolation and home guidance:    Self-isolation directions for patients:  Patients should not leave home -- no work, school, public places, stores, public transportation. No exceptions except emergencies or to receive medical care.  Stay in one room of the house by yourself, ideally with a window open. Use a separate bathroom if available. Household members should sleep in a separate room.  Duration of self-isolation for presumptive COVID (tested or not):   AT LEAST 5 days and until symptoms improving and afebrile for 24 hours off tylenol, with strict masking around others for 10 days.   You may stop strictly masking before 10 days if you have 2 negative home tests at least 48 hours apart.   You should not travel for 10 days, regardless of the symptom course.   For patients who had a more moderate Covid course experiencing shortness of breath after day 4, we recommend that the patient to isolate for the full 10 days. Patients that were hospitalized with severe COVID: May  need longer periods of isolation and it is reasonable to isolate until 2 home tests have come back negative at least 48 hours apart.   If any patient has a rebound of symptoms after day 10 or begins testing positive after testing negative on home tests, they should restart isolation and complete another 5 days of isolation followed by masking through day 10. This is more common when someone has received Paxlovid.  Additional precautions if COVID-negative patients in household:  Note: If everyone in the household is COVID positive, there is no need to isolate from other household members!  Wash hands frequently with soap and water or hand sanitizer, and clean high-touch surfaces (e.g. doorknobs and toilets) at least once a day.  Wear a facemask when sharing a room with other people.  Prohibit all visitors unless absolutely necessary.     Instructions for household members and close contacts:    Directions for household members and close contacts:  All household members who test negative may leave the house but should strictly mask around others for 10 days.  All close contacts should consider testing after Day 5, and immediately if they develop symptoms.

## 2021-12-06 ENCOUNTER — Encounter (HOSPITAL_BASED_OUTPATIENT_CLINIC_OR_DEPARTMENT_OTHER): Payer: Self-pay | Admitting: Family Medicine

## 2021-12-07 ENCOUNTER — Telehealth (HOSPITAL_BASED_OUTPATIENT_CLINIC_OR_DEPARTMENT_OTHER): Payer: Self-pay | Admitting: Hand Surgery

## 2021-12-07 NOTE — Telephone Encounter (Signed)
Left vm message via [name] interpreter ID [id number] to call Orthopedics to reschedule 12/29/21 appointment with Dr. Joylene John. Provider at  [facility].

## 2021-12-08 ENCOUNTER — Ambulatory Visit (HOSPITAL_BASED_OUTPATIENT_CLINIC_OR_DEPARTMENT_OTHER): Payer: No Typology Code available for payment source | Admitting: Orthopaedic Surgery

## 2021-12-29 ENCOUNTER — Ambulatory Visit (HOSPITAL_BASED_OUTPATIENT_CLINIC_OR_DEPARTMENT_OTHER): Payer: No Typology Code available for payment source | Admitting: Hand Surgery

## 2022-01-05 ENCOUNTER — Other Ambulatory Visit: Payer: Self-pay

## 2022-01-05 ENCOUNTER — Ambulatory Visit (HOSPITAL_BASED_OUTPATIENT_CLINIC_OR_DEPARTMENT_OTHER): Payer: No Typology Code available for payment source | Admitting: Orthopaedic Surgery

## 2022-01-05 ENCOUNTER — Ambulatory Visit
Admission: RE | Admit: 2022-01-05 | Discharge: 2022-01-05 | Disposition: A | Discharge status: Home / Self Care | Payer: No Typology Code available for payment source | Attending: Diagnostic Radiology | Admitting: Diagnostic Radiology

## 2022-01-05 DIAGNOSIS — M19012 Primary osteoarthritis, left shoulder: Secondary | ICD-10-CM | POA: Diagnosis present

## 2022-01-05 DIAGNOSIS — M654 Radial styloid tenosynovitis [de Quervain]: Secondary | ICD-10-CM | POA: Insufficient documentation

## 2022-01-05 DIAGNOSIS — R52 Pain, unspecified: Secondary | ICD-10-CM

## 2022-01-05 DIAGNOSIS — M67814 Other specified disorders of tendon, left shoulder: Secondary | ICD-10-CM | POA: Insufficient documentation

## 2022-01-05 DIAGNOSIS — M7502 Adhesive capsulitis of left shoulder: Secondary | ICD-10-CM | POA: Insufficient documentation

## 2022-01-05 DIAGNOSIS — M65331 Trigger finger, right middle finger: Secondary | ICD-10-CM | POA: Diagnosis not present

## 2022-01-05 MED ORDER — TRIAMCINOLONE ACETONIDE 40 MG/ML IJ SUSP
40.00 mg | Freq: Once | INTRAMUSCULAR | 0 refills | Status: AC
Start: 2022-01-05 — End: 2022-01-05

## 2022-01-05 MED ORDER — OXYCODONE-ACETAMINOPHEN 5-325 MG PO TABS
1.00 | ORAL_TABLET | ORAL | 0 refills | Status: AC | PRN
Start: 2022-01-05 — End: 2022-01-12

## 2022-01-05 MED ORDER — HYDROCORTISONE 1 % EX CREA
TOPICAL_CREAM | Freq: Two times a day (BID) | CUTANEOUS | 1 refills | Status: AC
Start: 2022-01-05 — End: 2022-01-12

## 2022-01-05 MED ORDER — PREDNISONE 10 MG PO TABS
ORAL_TABLET | ORAL | 0 refills | Status: AC
Start: 2022-01-05 — End: 2022-01-17

## 2022-01-05 MED FILL — PREDNISONE 10MG: 12 days supply | Qty: 30 | Fill #0 | Status: CP

## 2022-01-05 MED FILL — OXYCOD/APAP 5-325MG: 7 days supply | Qty: 42 | Fill #0 | Status: CP

## 2022-01-05 MED FILL — HYDROCORT CRE 1%: 7 days supply | Qty: 29 | Fill #0 | Status: CP

## 2022-01-05 NOTE — Progress Notes (Signed)
S: Patient comes in because of 2 problems.  She says over the last 3 months she is been getting a lot of pain in her left shoulder and she is having difficulty using her shoulder.  She is also gotten very severe pain in the radial aspect of the left wrist.  She has been treated with a thumb spica splint for the left wrist and some ibuprofen for the left shoulder but that did not help very much.  She is continued to have a lot of pain in both places.  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    She also complains of some triggering of the long finger of the right hand.    O: Inspection of the left shoulder does not show any abnormality that she has pain with range of motion.  However she has limited range of motion particularly in abduction to about 80 degrees and internal to her rotation to her superior iliac crest.  She has good external rotation she has good cross chest position and forward elevation is limited to about 160 degrees and she can barely get her hand behind her head.  The opposite right arm has full range of motion without pain.    She also has tenderness over the radial tuberosity on the radial side of her distal radius at the level of the extensor pollicis brevis.  She has been wearing a thumb spica splint but she says it does not help much she did use some cortisone cream but that did not seem to help much either.  She has positive Finkelstein's test but neurovascular status of the hand is intact.    X-rays of her shoulder taken recently only show some mild osteoarthritic changes.    A: Adhesive capsulitis of the left shoulder and de Quervain's syndrome of the left wrist and right middle finger triggering.    P: After appropriate timeout and prepped with alcohol the subacromial bursa on the left was injected with 4 cc 1% Xylocaine and 40 mg Kenalog.  She tolerated the injection well.  Then after the appropriate timeout and prepped with alcohol the de  Quervain's tendinitis was injected on the distal radius with once half cc of 1% Xylocaine and half cc of 0.5% bupivacaine and 20 mg Kenalog.  She tolerated that injection reasonably well too although it was more painful than the shoulder.    She was then prescribed a prednisone taper from 40 mg down for 12 days and given some more hydrocortisone cream to use over the wrist.  She will continue to using the wrist splint for another week.  She will then discontinue it and start gentle range of motion she is going to do limited activities with the left upper extremity for the next week to allow the medication to work.  She was referred to physical therapy for the left shoulder.    I also referred her to the orthopedic hand service for evaluation of the trigger finger of the right hand

## 2022-01-24 ENCOUNTER — Other Ambulatory Visit: Payer: Self-pay

## 2022-01-24 ENCOUNTER — Ambulatory Visit: Payer: No Typology Code available for payment source | Attending: Internal Medicine | Admitting: Internal Medicine

## 2022-01-24 VITALS — BP 143/81 | HR 63 | Temp 98.0°F | Ht 64.0 in | Wt 156.0 lb

## 2022-01-24 DIAGNOSIS — R351 Nocturia: Secondary | ICD-10-CM | POA: Diagnosis present

## 2022-01-24 DIAGNOSIS — K648 Other hemorrhoids: Secondary | ICD-10-CM | POA: Diagnosis not present

## 2022-01-24 DIAGNOSIS — Z1231 Encounter for screening mammogram for malignant neoplasm of breast: Secondary | ICD-10-CM | POA: Insufficient documentation

## 2022-01-24 DIAGNOSIS — R195 Other fecal abnormalities: Secondary | ICD-10-CM | POA: Insufficient documentation

## 2022-01-24 DIAGNOSIS — K625 Hemorrhage of anus and rectum: Secondary | ICD-10-CM | POA: Insufficient documentation

## 2022-01-24 LAB — POC URINALYSIS
BILIRUBIN, URINE: NEGATIVE
GLUCOSE,URINE: NEGATIVE
KETONE, URINE: NEGATIVE
LEUKOCYTE ESTERASE: NEGATIVE
NITRITE, URINE: NEGATIVE
OCCULT BLOOD, URINE: NEGATIVE
PH URINE: 5.5 (ref 5.0–8.0)
PROTEIN, URINE: NEGATIVE
SPECIFIC GRAVITY, URINE: <=1.005 (ref 1.003–1.030)
UROBILINOGEN URINE: 0.2 (ref 0.2–1.0)

## 2022-01-24 NOTE — Progress Notes (Signed)
SUBJECTIVE:    Tina [name] is a 66 y.o who presents for f/u   Hx of + IFOB 10/22/2020   Was referred to have a colonoscopy, was not able to have the colonoscopy, was unable to tolerate the prep   Travelled to [place] , spent some time there with her daugther who resides there. Was seen by GI specialist in [place] for rectal bleeding and found to have internal hemorrhoids. 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 Nares patent, no erythema noted, no nasal drainage,discharge, lesions noted. TMs intact, no perforation, erythema  or effusion noted, Throat clear,MMM, no exudate noted.  NECK: supple, no nodules, negative lymphadenopathy  CHEST: Clear to auscultation, no wheezes or rales.  CV: RRR, S1, S2 WNL, no murmurs  The abdomen is soft without tenderness, guarding, mass, rebound or organomegaly. Bowel sounds are normal. No CVA tenderness or inguinal adenopathy noted.      ASSESSMENT/PLAN  (R19.5) Guaiac + stool  And   (K64.8) Internal hemorrhoid  (K62.5) Rectal bleeding  Comment:   Will order colonoscopy, pt agreed to plan    Plan: COLONOSCOPY            (Z12.31) Encounter for screening mammogram for malignant neoplasm of breast  Comment:   Plan: [place] SCREENING MAMMO BILATERAL DIGITAL WITH DBT &        CAD            (R35.1) Nocturia  Comment:   Plan: REFERRAL TO UROLOGY (INT),URINALYSIS        RFLX TO URINE CULT

## 2022-01-25 ENCOUNTER — Encounter (HOSPITAL_BASED_OUTPATIENT_CLINIC_OR_DEPARTMENT_OTHER): Payer: Self-pay

## 2022-01-27 ENCOUNTER — Encounter (HOSPITAL_BASED_OUTPATIENT_CLINIC_OR_DEPARTMENT_OTHER): Payer: Self-pay | Admitting: Internal Medicine

## 2022-02-01 ENCOUNTER — Other Ambulatory Visit: Payer: Self-pay

## 2022-02-02 ENCOUNTER — Ambulatory Visit
Payer: No Typology Code available for payment source | Attending: Rehabilitative and Restorative Service Providers" | Admitting: Rehabilitative and Restorative Service Providers"

## 2022-02-02 ENCOUNTER — Other Ambulatory Visit: Payer: Self-pay

## 2022-02-02 DIAGNOSIS — M25512 Pain in left shoulder: Secondary | ICD-10-CM | POA: Insufficient documentation

## 2022-02-02 NOTE — Progress Notes (Signed)
I certify that the documented Treatment Plan is reasonable and necessary.    10/12/2014  Trella Thurmond, MD

## 2022-02-02 NOTE — Progress Notes (Signed)
OUTPATIENT PHYSICAL THERAPY EVALUATION    Referring Physician: Vertell Limber, MD  Date of Onset: multiple years, worse over the last 5 months    Acute pain of left shoulder: M25.512       Subjective:   Pt presents with complaints of left shoulder pain, which has been bad for the last 3-4 months, all located behind the shoulder on the shoulder blade, she is also experiencing pain along the left wrist - has begun using a splint at night. She doesn't recall any specific movement nor trauma to cause this. 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 She received an injection, which in addition to medication has been somewhat helpful.     X-rays, August 2023:   Mild AC DJD.  Subtle calcific tendinopathy of the infraspinatus.      Pain:    Pre-morbid Functional Level: pain free  Functional Deficits: difficulty and pain with reaching in all directions, UB dressing, unable to lie on the left side, difficulty holding weight by her side, unable to lift overhead    Contraindications/Precautions: n/a    Occupation: childcare  Dressing/Grooming: difficulty with UB dressing  Driving: n/a  Sleeping: unable to lie on the left side    Primary Language: [name] ; Requires Interpreter: yes     Objective:  Please Note: Only populated fields were assessed by provider, fields left blank were not assessed.    Palpation: inc tonicity of UT, supraspinous fossa  Posture: rounded shoulders, inc protraction B, inc kyphosis      Range of Motion and Strength:   LEFT LEFT RIGHT RIGHT    A/PROM MMT A/PROM MMT   CERVICAL         FLEX         EXT         SB         ROT         SHOULDER         FLEX 160* 4-/5 WNL      EXT         ABD 150*, difficulty and pain starts at approx 90 deg 3+/5* WNL      ADD         IR Iliac crest* 3+/5 WNL 4+/5     ER C4*  3+/5* T2      Joint Mobility: 2/6        SPECIAL TESTS LEFT RIGHT   CERVICAL       Vertebral Artery       Sharp Purser's       Alar Ligament       Traction       Compression        Spurling's          SHOULDER       Hawkins-Kennedy + significant pain      Neer's       O'Brien's       Biceps Load       Ant Apprehension       Post Apprehension       Sulcus       Empty Can - but weak      Full Can  -  but weak      Drop Arm -      ER lag sign       IR lag sign       Lift-Off       Hornblower's  ULTT:              02/02/22 1300   Language Information   Language of Care Portuguese   Interpreter Yes   Rehab Discipline   Rehab Discipline PT   Visit   Visit number 1   POC Due date 10/21   Time Calculation   Start Time 1325   Stop Time 1415   Time Calculation (min) 50 min   Ther Exercise   Exercise shoulder AAROM (Flexion, Abduction, ER), and shoulder isometrics (Abd and ER)   Ther Exercise 2   Exercise (cont with rows, extensions and adductions; side lying shoulder ER and abduction, ball circles at waist level)   Patient Education   What was taught? dx, PT POC, HEP   Method Practice;Demo;Verbal   Patient comprehension Yes         Physical Therapy Plan of Care    MD: [name], APRN  Referring Provider: Vertell Limber, MD  Diagnosis: acute right shoulder pain    Assessment:   Pt presents with gradual onset of left shoulder pain over the last 5 months, gradual onset with no particular MOI. She demo dec AROM, dec strength. Deficits impact her ability to complete ADL's. Deficits impact her ability to complete ADL's and occupational responsibilities.        Prognosis: fair    Short Term Goals: 4 weeks  Dec pain by 2 to assist with sleep tolerance  Inc AROM by 20 degrees in all directions to assist with UB dressing  Inc MMT by 1/3    Long Term Goals: 6 weeks  Inc AROM to 75% of unaffected side  Inc MMT by 1 muscle grade throughout to promote normal scapulothoracic rhythm with reaching activities  3.   Pt will be able to carry 5# item by their side with max 3/10 pain to assist with functional carrying to help with laundry, groceries and other household responsibilities    Physical Therapy will  consist of:   ** PT Eval - Low Complexity (CPT 97161)  ** PT Re-Eval (CPT 97164)  ** Stretching/ROM/Therapeutic Exercise (97110)  ** Home Exercise Program/ Patient Education (CPT [phone])  ** Neuromuscular Re-education (CPT [phone])  ** Manual Therapy / Joint / Soft tissue Mobilization (CPT 97140)  ** Functional Activities (CPT 97530)    Recommend physical therapy 1 times per week for 6 weeks, after which a reassessment for future physical therapy needs will occur.    Discussed and educated patient on HEP, prognosis, and POC. Patient is agreeable to treatment plan and is aware of attendance policy.    Patient Regis Bill is aware of attendance policy: Yes  Plan of care discussed with Patient/Family: Yes  Patient goals reviewed and incorporated in plan of care: Yes  Patient/Family agrees with plan of care: Yes  Patient/Family education: Yes  Does patient feel safe at home: Yes         Hurman Horn, PT, DPT  [facility]ani Sow, [place], Lic # [id number]

## 2022-02-09 ENCOUNTER — Other Ambulatory Visit: Payer: Self-pay

## 2022-02-09 ENCOUNTER — Ambulatory Visit
Payer: No Typology Code available for payment source | Attending: Rehabilitative and Restorative Service Providers" | Admitting: Rehabilitative and Restorative Service Providers"

## 2022-02-09 DIAGNOSIS — M25512 Pain in left shoulder: Secondary | ICD-10-CM | POA: Diagnosis not present

## 2022-02-09 NOTE — Progress Notes (Signed)
Subjective:    She has been lifting things because she was moving this week so overall she's tired, but thankfully the shoulder didn't hurt while lifting boxes. She's done her exercises a few times and she is paying attention to her posture.     Objective:       02/09/22 1400   Language Information   Language of Care Portuguese   Interpreter Patient Declined   Rehab Discipline   Rehab Discipline PT   Visit   Visit number 2   POC Due date 10/21   Time Calculation   Start Time 1432   Stop Time 1445   Time Calculation (min) 76mn   Ther Exercise   Exercise shoulder extensions and adductions (in scapular plane)   Reps 20   Sets 3   Holds YTB, < 90 degrees flexion   Patient Education   What was taught? added above BEP   Method Practice       Assessment:    Pt requested an abridged session as she's in the middle of moving. She was still however able to perform extensions/adductions within a comfortable range, benefits from cues to maintain elbow extension throughout the movement to maximize proximal activation, fair-good carryover throughout tx with no worsening pain.    Plan:    Cont per tol with RTC strengthening, side lying shoulder ER and Abduction, introduction of rows (low and high).     MJeani Sow [place] Lic # [id number]

## 2022-02-16 ENCOUNTER — Telehealth (HOSPITAL_BASED_OUTPATIENT_CLINIC_OR_DEPARTMENT_OTHER): Payer: Self-pay | Admitting: Internal Medicine

## 2022-02-16 ENCOUNTER — Ambulatory Visit (HOSPITAL_BASED_OUTPATIENT_CLINIC_OR_DEPARTMENT_OTHER): Payer: No Typology Code available for payment source | Admitting: Rehabilitative and Restorative Service Providers"

## 2022-02-16 DIAGNOSIS — I10 Essential (primary) hypertension: Secondary | ICD-10-CM

## 2022-02-16 DIAGNOSIS — R55 Syncope and collapse: Secondary | ICD-10-CM

## 2022-02-16 DIAGNOSIS — I491 Atrial premature depolarization: Secondary | ICD-10-CM
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
## 2022-02-17 NOTE — Telephone Encounter (Signed)
Urgent referral to Cardiology placed  Please also schedule tele visit  with this provider

## 2022-02-21 ENCOUNTER — Telehealth (HOSPITAL_BASED_OUTPATIENT_CLINIC_OR_DEPARTMENT_OTHER): Payer: Self-pay | Admitting: Internal Medicine

## 2022-02-21 NOTE — Telephone Encounter (Signed)
Med on file with Pharmacy:  [facility] confirmed that Fluoxetine has  active refills remaining. No refill is required at this time.

## 2022-02-21 NOTE — Telephone Encounter (Signed)
ALVIN RUBANO [id number], 66 year old, female    Calls today:  Refill      Medicine Name :   FLUoxetine (PROZAC) 20 MG capsule      [facility], [place]  Phone: [phone] Fax: [phone]      Person calling on behalf of patient: Patient (self)    CALL BACK NUMBER: [phone]   Best time to call back:   Cell phone:   Other phone:    Patient's language of care: [name] ([place]    Patient needs a [name] interpreter.    Patient's PCP: [name], Carolyn, APRN    Primary Care Home Site:  [facility]

## 2022-02-22 ENCOUNTER — Other Ambulatory Visit: Payer: Self-pay

## 2022-02-22 ENCOUNTER — Encounter (HOSPITAL_BASED_OUTPATIENT_CLINIC_OR_DEPARTMENT_OTHER): Payer: Self-pay | Admitting: Internal Medicine

## 2022-02-22 ENCOUNTER — Ambulatory Visit: Payer: No Typology Code available for payment source | Attending: Internal Medicine | Admitting: Internal Medicine

## 2022-02-22 VITALS — BP 131/77 | HR 71 | Temp 96.2°F | Wt 155.0 lb

## 2022-02-22 DIAGNOSIS — R55 Syncope and collapse: Secondary | ICD-10-CM | POA: Diagnosis not present

## 2022-02-22 DIAGNOSIS — I10 Essential (primary) hypertension: Secondary | ICD-10-CM | POA: Diagnosis not present

## 2022-02-22 DIAGNOSIS — I2510 Atherosclerotic heart disease of native coronary artery without angina pectoris: Secondary | ICD-10-CM | POA: Diagnosis present

## 2022-02-22 DIAGNOSIS — E7800 Pure hypercholesterolemia, unspecified: Secondary | ICD-10-CM | POA: Insufficient documentation
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
## 2022-02-23 ENCOUNTER — Ambulatory Visit (HOSPITAL_BASED_OUTPATIENT_CLINIC_OR_DEPARTMENT_OTHER): Payer: No Typology Code available for payment source | Admitting: Rehabilitative and Restorative Service Providers"

## 2022-02-23 LAB — EKG

## 2022-02-24 ENCOUNTER — Other Ambulatory Visit (HOSPITAL_BASED_OUTPATIENT_CLINIC_OR_DEPARTMENT_OTHER): Payer: Self-pay | Admitting: Internal Medicine

## 2022-02-24 DIAGNOSIS — R55 Syncope and collapse: Secondary | ICD-10-CM
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
## 2022-03-02 ENCOUNTER — Other Ambulatory Visit: Payer: Self-pay

## 2022-03-02 ENCOUNTER — Ambulatory Visit (HOSPITAL_BASED_OUTPATIENT_CLINIC_OR_DEPARTMENT_OTHER): Payer: No Typology Code available for payment source | Admitting: Rehabilitative and Restorative Service Providers"

## 2022-03-02 ENCOUNTER — Ambulatory Visit (HOSPITAL_BASED_OUTPATIENT_CLINIC_OR_DEPARTMENT_OTHER)
Admission: RE | Admit: 2022-03-02 | Discharge: 2022-03-02 | Disposition: A | Discharge status: Home / Self Care | Payer: No Typology Code available for payment source | Source: Ambulatory Visit

## 2022-03-02 ENCOUNTER — Ambulatory Visit
Admission: RE | Admit: 2022-03-02 | Discharge: 2022-03-02 | Disposition: A | Discharge status: Home / Self Care | Payer: No Typology Code available for payment source | Attending: Internal Medicine | Admitting: Internal Medicine

## 2022-03-02 DIAGNOSIS — R55 Syncope and collapse: Secondary | ICD-10-CM

## 2022-03-03 ENCOUNTER — Telehealth (HOSPITAL_BASED_OUTPATIENT_CLINIC_OR_DEPARTMENT_OTHER): Payer: Self-pay

## 2022-03-03 ENCOUNTER — Ambulatory Visit: Payer: No Typology Code available for payment source | Attending: Internal Medicine | Admitting: Internal Medicine

## 2022-03-03 DIAGNOSIS — M71452 Calcium deposit in bursa, left hip: Secondary | ICD-10-CM | POA: Insufficient documentation

## 2022-03-03 DIAGNOSIS — R55 Syncope and collapse: Secondary | ICD-10-CM | POA: Insufficient documentation

## 2022-03-03 LAB — ECHOCARDIOGRAM W/ DOPPLER: LVEF: 60 %

## 2022-03-03 MED ORDER — OMEPRAZOLE 20 MG PO CPDR
20.00 mg | DELAYED_RELEASE_CAPSULE | Freq: Every day | ORAL | 1 refills | Status: AC
Start: 2022-03-03 — End: 2022-08-30

## 2022-03-03 NOTE — Telephone Encounter (Signed)
Spoke  with  patient  .  Message  reviewed.  [name]  interpreter  used  # [id number].  Erskine Speed  RN

## 2022-03-03 NOTE — Telephone Encounter (Signed)
-----   Message from Clinton Quant, MD sent at 03/03/2022 11:23 AM EDT -----  Please call patient to inform that the echocardiogram looks good, her heart is strong, heart valves are working normally.  No change compared to prior echo.  Good news    thx
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
## 2022-03-08 ENCOUNTER — Other Ambulatory Visit (HOSPITAL_BASED_OUTPATIENT_CLINIC_OR_DEPARTMENT_OTHER): Payer: Self-pay | Admitting: Orthopaedic Surgery

## 2022-03-08 NOTE — Telephone Encounter (Signed)
PER Pharmacy, Tina [name] is a 66 year old female has requested a refill of      - Hydrocortisone 1 % cream      Last Office Visit: 03/03/22 with pcp  Last Physical Exam: 10/27/16     There are no preventive care reminders to display for this patient.     Other Med Adult:  Most Recent BP Reading(s)  02/22/22 : 131/77        Cholesterol (mg/dL)   Date Value   04/26/2018 244 (*H)     LOW DENSITY LIPOPROTEIN DIRECT (mg/dL)   Date Value   04/26/2018 159     HIGH DENSITY LIPOPROTEIN (mg/dL)   Date Value   04/26/2018 58     TRIGLYCERIDES (mg/dL)   Date Value   04/26/2018 116         THYROID SCREEN TSH REFLEX FT4 (uIU/mL)   Date Value   11/08/2021 1.550         TSH (THYROID STIM HORMONE) (uIU/mL)   Date Value   09/23/2010 1.52       HEMOGLOBIN A1C (%)   Date Value   11/08/2021 6.2 (H)       No results found for: POCA1C      No results found for: INR    SODIUM (mmol/L)   Date Value   11/08/2021 143       POTASSIUM (mmol/L)   Date Value   11/08/2021 4.6           CREATININE (mg/dL)   Date Value   11/08/2021 0.8        Documented patient preferred pharmacies:    [facility], [place]. [address]  Phone: [phone] Fax: [phone]

## 2022-03-10 ENCOUNTER — Encounter (HOSPITAL_BASED_OUTPATIENT_CLINIC_OR_DEPARTMENT_OTHER): Payer: Self-pay

## 2022-03-13 ENCOUNTER — Other Ambulatory Visit (HOSPITAL_BASED_OUTPATIENT_CLINIC_OR_DEPARTMENT_OTHER): Payer: Self-pay | Admitting: Family Medicine

## 2022-03-13 ENCOUNTER — Ambulatory Visit: Payer: No Typology Code available for payment source | Attending: Neurology | Admitting: Neurology

## 2022-03-13 DIAGNOSIS — R404 Transient alteration of awareness: Secondary | ICD-10-CM
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
## 2022-03-13 NOTE — Telephone Encounter (Signed)
PER Pharmacy, Tina [name] is a 66 year old female has requested a refill of ibu 800.      Last Office Visit: [id number] with [name], C  Last Physical Exam: [id number]      Other Med Adult:  Most Recent BP Reading(s)  02/22/22 : 131/77        Cholesterol (mg/dL)   Date Value   04/26/2018 244 (*H)     LOW DENSITY LIPOPROTEIN DIRECT (mg/dL)   Date Value   04/26/2018 159     HIGH DENSITY LIPOPROTEIN (mg/dL)   Date Value   04/26/2018 58     TRIGLYCERIDES (mg/dL)   Date Value   04/26/2018 116         THYROID SCREEN TSH REFLEX FT4 (uIU/mL)   Date Value   11/08/2021 1.550         TSH (THYROID STIM HORMONE) (uIU/mL)   Date Value   09/23/2010 1.52       HEMOGLOBIN A1C (%)   Date Value   11/08/2021 6.2 (H)       No results found for: POCA1C      No results found for: INR    SODIUM (mmol/L)   Date Value   11/08/2021 143       POTASSIUM (mmol/L)   Date Value   11/08/2021 4.6           CREATININE (mg/dL)   Date Value   11/08/2021 0.8       Documented patient preferred pharmacies:    [facility], [place]. [address]  Phone: [phone] Fax: [phone]

## 2022-03-14 ENCOUNTER — Encounter (HOSPITAL_BASED_OUTPATIENT_CLINIC_OR_DEPARTMENT_OTHER): Payer: Self-pay

## 2022-03-14 NOTE — Progress Notes (Signed)
Pt is schedule for EEG 03/18/2022 at 10:30am (Urgent request) Ph call to pt. Was not able to reach, left 2 VM message; one in [place], then another one via [name] interpreter. ... IG

## 2022-03-18 ENCOUNTER — Ambulatory Visit: Payer: No Typology Code available for payment source | Attending: Neurology

## 2022-03-18 ENCOUNTER — Other Ambulatory Visit: Payer: Self-pay

## 2022-03-18 DIAGNOSIS — R404 Transient alteration of awareness: Secondary | ICD-10-CM | POA: Insufficient documentation

## 2022-03-18 LAB — ROUTINE EEG

## 2022-03-23 ENCOUNTER — Telehealth (HOSPITAL_BASED_OUTPATIENT_CLINIC_OR_DEPARTMENT_OTHER): Payer: Self-pay | Admitting: Neurology

## 2022-03-23 NOTE — Telephone Encounter (Signed)
Spoke to the pt over the phone and explained the EEG result, which was normal.  I have suggested that she follow up with a neurologist in [place] for further evaluation.     [name] phone interpreter was used for this encounter.       Leslie Dales, MD

## 2022-03-27 ENCOUNTER — Telehealth (HOSPITAL_BASED_OUTPATIENT_CLINIC_OR_DEPARTMENT_OTHER): Payer: Self-pay

## 2022-03-27 NOTE — Telephone Encounter (Signed)
Called patient, no answer. LVM to call office back.    Clinton Quant, MD  [facility] Nurses Pool  Please call patient let her know that the event monitor showed many episodes of accelerated heart  beat c/w supraventricular short arrhythmias-longest episode was 13 seconds.  No evidence of slow heartbeats    I know she is in [place], if she has the Toprol with her, she can start taking half pill ( ie 12.5 mg)    thanks

## 2022-03-27 NOTE — Telephone Encounter (Signed)
-----   Message from Clinton Quant, MD sent at 03/27/2022  2:42 PM EST -----  Please call patient let her know that the event monitor showed many episodes of accelerated heart  beat c/w supraventricular short arrhythmias-longest episode was 13 seconds.   No evidence of slow heartbeats    I know she is in [place], if she has the Toprol with her, she can start taking half pill ( ie 12.5 mg)    thanks

## 2022-03-28 NOTE — Telephone Encounter (Signed)
Second attempt at reaching patient, goes directly to voicemail.     Left voicemail to call office back.

## 2022-03-28 NOTE — Telephone Encounter (Signed)
Called patient with telephone interpreter, made her aware of results. Advised patient to take half pill of Toprol (12.5 mg daily)        Budiu, Daniela, MD  [facility] Nurses Pool  Please call patient let her know that the event monitor showed many episodes of accelerated heart  beat c/w supraventricular short arrhythmias-longest episode was 13 seconds.  No evidence of slow heartbeats    I know she is in [place], if she has the Toprol with her, she can start taking half pill ( ie 12.5 mg)    Thanks

## 2022-03-28 NOTE — Telephone Encounter (Signed)
-----   Message from Janeann Forehand sent at 03/28/2022  1:38 PM EST -----  Regarding: FW: EEG RESULTS    ----- Message -----  From: Alfred Levins  Sent: 03/28/2022   1:34 PM EST  To: [employer] Front Desk Pool  Subject: EEG RESULTS                                      [facility] [place] [id number], 66 year old, female, Telephone Information:  Home Phone      [phone]  Work Phone      Not on file.  Mobile          [phone]      Patient's Preferred Pharmacy:     [facility] [place], [place]  Phone: [phone] Fax: [phone]      CONFIRMED TODAY: Yes    CALL BACK NUMBER:   [phone]  Best time to call back: anytime  Cell phone:   Other phone:    Available times:    Patient's language of care: [name]    Patient needs a [name] interpreter.    Patient's PCP: [name], Carolyn, APRN    Person calling on behalf of patient: self    Calls today, Patient want the results of EEG, she said that she has been calling & nobody call her back. (Dr. Ellan Lambert)

## 2022-03-30 ENCOUNTER — Ambulatory Visit (HOSPITAL_BASED_OUTPATIENT_CLINIC_OR_DEPARTMENT_OTHER): Payer: No Typology Code available for payment source | Admitting: Specialist

## 2022-03-30 ENCOUNTER — Ambulatory Visit (HOSPITAL_BASED_OUTPATIENT_CLINIC_OR_DEPARTMENT_OTHER): Payer: No Typology Code available for payment source

## 2022-08-06 ENCOUNTER — Encounter (HOSPITAL_BASED_OUTPATIENT_CLINIC_OR_DEPARTMENT_OTHER): Payer: Self-pay | Admitting: Internal Medicine

## 2022-08-06 ENCOUNTER — Encounter (HOSPITAL_BASED_OUTPATIENT_CLINIC_OR_DEPARTMENT_OTHER): Payer: Self-pay | Admitting: Neurology

## 2022-08-07 ENCOUNTER — Encounter (HOSPITAL_BASED_OUTPATIENT_CLINIC_OR_DEPARTMENT_OTHER): Payer: Self-pay | Admitting: Family Medicine

## 2022-08-07 ENCOUNTER — Encounter (HOSPITAL_BASED_OUTPATIENT_CLINIC_OR_DEPARTMENT_OTHER): Payer: Self-pay | Admitting: Orthopaedic Surgery

## 2022-08-07 DIAGNOSIS — G8929 Other chronic pain: Secondary | ICD-10-CM

## 2022-08-07 DIAGNOSIS — M654 Radial styloid tenosynovitis [de Quervain]: Secondary | ICD-10-CM

## 2022-08-08 ENCOUNTER — Other Ambulatory Visit (HOSPITAL_BASED_OUTPATIENT_CLINIC_OR_DEPARTMENT_OTHER): Payer: Self-pay | Admitting: Licensed Practical Nurse

## 2022-08-08 DIAGNOSIS — G8929 Other chronic pain: Secondary | ICD-10-CM

## 2022-08-08 DIAGNOSIS — M654 Radial styloid tenosynovitis [de Quervain]: Secondary | ICD-10-CM

## 2022-08-08 NOTE — Telephone Encounter (Signed)
PER Patient (self), Tina [name] is a 67 year old female has requested a refill of      -  Meloxicam       Last Office Visit: 03/03/22 with [name], Loletha Grayer  Last Physical Exam: 10/27/16     There are no preventive care reminders to display for this patient.     Other Med Adult:  Most Recent BP Reading(s)  02/22/22 : 131/77        Cholesterol (mg/dL)   Date Value   04/26/2018 244 (*H)     LOW DENSITY LIPOPROTEIN DIRECT (mg/dL)   Date Value   04/26/2018 159     HIGH DENSITY LIPOPROTEIN (mg/dL)   Date Value   04/26/2018 58     TRIGLYCERIDES (mg/dL)   Date Value   04/26/2018 116         THYROID SCREEN TSH REFLEX FT4 (uIU/mL)   Date Value   11/08/2021 1.550         TSH (THYROID STIM HORMONE) (uIU/mL)   Date Value   09/23/2010 1.52       HEMOGLOBIN A1C (%)   Date Value   11/08/2021 6.2 (H)       No results found for: "POCA1C"      No results found for: "INR"    SODIUM (mmol/L)   Date Value   11/08/2021 143       POTASSIUM (mmol/L)   Date Value   11/08/2021 4.6           CREATININE (mg/dL)   Date Value   11/08/2021 0.8        Documented patient preferred pharmacies:    [id number] - [place], [place] - [address]  Phone: [phone] Fax: [phone]

## 2022-08-09 MED ORDER — MELOXICAM 7.5 MG PO TABS
7.5000 mg | ORAL_TABLET | Freq: Every day | ORAL | 0 refills | Status: DC
Start: 2022-08-09 — End: 2022-09-05

## 2022-08-11 ENCOUNTER — Telehealth (HOSPITAL_BASED_OUTPATIENT_CLINIC_OR_DEPARTMENT_OTHER): Payer: Self-pay
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
## 2022-08-29 ENCOUNTER — Telehealth (HOSPITAL_BASED_OUTPATIENT_CLINIC_OR_DEPARTMENT_OTHER): Payer: Self-pay

## 2022-08-29 NOTE — Progress Notes (Signed)
Called Pt to schedule a colonoscopy.  Left message asking Pt to return a call.

## 2022-09-04 ENCOUNTER — Telehealth (HOSPITAL_BASED_OUTPATIENT_CLINIC_OR_DEPARTMENT_OTHER): Payer: Self-pay

## 2022-09-04 NOTE — Progress Notes (Signed)
Spoke with patient regarding scheduling colonoscopy. Pt  decline.  Pt stated that she is in [place] and she wil do the colonoscopy there.

## 2022-09-05 ENCOUNTER — Other Ambulatory Visit (HOSPITAL_BASED_OUTPATIENT_CLINIC_OR_DEPARTMENT_OTHER): Payer: Self-pay | Admitting: Family Medicine

## 2022-09-05 DIAGNOSIS — G8929 Other chronic pain: Secondary | ICD-10-CM

## 2022-09-05 DIAGNOSIS — M654 Radial styloid tenosynovitis [de Quervain]: Secondary | ICD-10-CM

## 2022-09-05 NOTE — Telephone Encounter (Signed)
PER Pharmacy, Tina [name] is a 67 year old female has requested a refill of      -  Meloxicam       Last Office Visit: 03/03/22 with [place], Salena Saner  Last Physical Exam: 10/27/16     There are no preventive care reminders to display for this patient.     Other Med Adult:  Most Recent BP Reading(s)  02/22/22 : 131/77        Cholesterol (mg/dL)   Date Value   42/59/5638 244 (*H)     LOW DENSITY LIPOPROTEIN DIRECT (mg/dL)   Date Value   75/64/3329 159     HIGH DENSITY LIPOPROTEIN (mg/dL)   Date Value   51/88/4166 58     TRIGLYCERIDES (mg/dL)   Date Value   11/12/1599 116         THYROID SCREEN TSH REFLEX FT4 (uIU/mL)   Date Value   11/08/2021 1.550         TSH (THYROID STIM HORMONE) (uIU/mL)   Date Value   09/23/2010 1.52       HEMOGLOBIN A1C (%)   Date Value   11/08/2021 6.2 (H)       No results found for: "POCA1C"      No results found for: "INR"    SODIUM (mmol/L)   Date Value   11/08/2021 143       POTASSIUM (mmol/L)   Date Value   11/08/2021 4.6           CREATININE (mg/dL)   Date Value   09/32/3557 0.8        Documented patient preferred pharmacies:      [id number] [place] [place] - [address]  Phone: [phone] Fax: [phone]

## 2022-09-19 ENCOUNTER — Telehealth (HOSPITAL_BASED_OUTPATIENT_CLINIC_OR_DEPARTMENT_OTHER): Payer: Self-pay

## 2022-09-19 NOTE — Progress Notes (Signed)
Called Pt to schedule a colonoscopy.  Left message asking Pt to return a call.

## 2022-10-02 ENCOUNTER — Other Ambulatory Visit (HOSPITAL_BASED_OUTPATIENT_CLINIC_OR_DEPARTMENT_OTHER): Payer: Self-pay | Admitting: Internal Medicine

## 2022-10-02 DIAGNOSIS — G8929 Other chronic pain: Secondary | ICD-10-CM

## 2022-10-02 DIAGNOSIS — M654 Radial styloid tenosynovitis [de Quervain]: Secondary | ICD-10-CM

## 2022-10-02 NOTE — Telephone Encounter (Signed)
PER Pharmacy, Tina [name] is a 67 year old female has requested a refill of      -  Mobic       Last Office Visit: 01/24/2022 with Carolyn [place]  Last Physical Exam: 10/27/2016     There are no preventive care reminders to display for this patient.     Other Med Adult:  Most Recent BP Reading(s)  02/22/22 : 131/77        Cholesterol (mg/dL)   Date Value   95/63/8756 244 (*H)     LOW DENSITY LIPOPROTEIN DIRECT (mg/dL)   Date Value   43/32/9518 159     HIGH DENSITY LIPOPROTEIN (mg/dL)   Date Value   84/16/6063 58     TRIGLYCERIDES (mg/dL)   Date Value   01/60/1093 116         THYROID SCREEN TSH REFLEX FT4 (uIU/mL)   Date Value   11/08/2021 1.550         TSH (THYROID STIM HORMONE) (uIU/mL)   Date Value   09/23/2010 1.52       HEMOGLOBIN A1C (%)   Date Value   11/08/2021 6.2 (H)       No results found for: "POCA1C"      No results found for: "INR"    SODIUM (mmol/L)   Date Value   11/08/2021 143       POTASSIUM (mmol/L)   Date Value   11/08/2021 4.6           CREATININE (mg/dL)   Date Value   23/55/7322 0.8        Documented patient preferred pharmacies:      [id number] [place], [place] - [address]  Phone: [phone] Fax: [phone]

## 2022-10-04 ENCOUNTER — Telehealth (HOSPITAL_BASED_OUTPATIENT_CLINIC_OR_DEPARTMENT_OTHER): Payer: Self-pay

## 2022-10-04 NOTE — Progress Notes (Signed)
Spoke with patient regarding scheduling colonoscopy. Pt declined.    She is in [place] and is going to do the colonoscopy there.

## 2022-10-30 ENCOUNTER — Other Ambulatory Visit (HOSPITAL_BASED_OUTPATIENT_CLINIC_OR_DEPARTMENT_OTHER): Payer: Self-pay | Admitting: Internal Medicine

## 2022-10-30 DIAGNOSIS — M654 Radial styloid tenosynovitis [de Quervain]: Secondary | ICD-10-CM

## 2022-10-30 DIAGNOSIS — G8929 Other chronic pain: Secondary | ICD-10-CM

## 2022-10-30 NOTE — Telephone Encounter (Signed)
PER Pharmacy, Tina [name] is a 68 year old female has requested a refill of meloxicam.      Last Office Visit: 01/05/2022 Edwyna Ready, MD     Last Physical Exam: 10/27/2016    There are no preventive care reminders to display for this patient.    Other Med Adult:  Most Recent BP Reading(s)  02/22/22 : 131/77        Cholesterol (mg/dL)   Date Value   16/02/9603 244 (*H)     LOW DENSITY LIPOPROTEIN DIRECT (mg/dL)   Date Value   54/01/8118 159     HIGH DENSITY LIPOPROTEIN (mg/dL)   Date Value   14/78/2956 58     TRIGLYCERIDES (mg/dL)   Date Value   21/30/8657 116         THYROID SCREEN TSH REFLEX FT4 (uIU/mL)   Date Value   11/08/2021 1.550         TSH (THYROID STIM HORMONE) (uIU/mL)   Date Value   09/23/2010 1.52       HEMOGLOBIN A1C (%)   Date Value   11/08/2021 6.2 (H)       No results found for: "POCA1C"      No results found for: "INR"    SODIUM (mmol/L)   Date Value   11/08/2021 143       POTASSIUM (mmol/L)   Date Value   11/08/2021 4.6           CREATININE (mg/dL)   Date Value   84/69/6295 0.8       Documented patient preferred pharmacies:      [id number] [place], [place] - [address]  Phone: [phone] Fax: [phone]

## 2022-11-28 ENCOUNTER — Other Ambulatory Visit (HOSPITAL_BASED_OUTPATIENT_CLINIC_OR_DEPARTMENT_OTHER): Payer: Self-pay | Admitting: Orthopaedic Surgery

## 2022-11-28 DIAGNOSIS — M654 Radial styloid tenosynovitis [de Quervain]: Secondary | ICD-10-CM

## 2022-11-28 DIAGNOSIS — G8929 Other chronic pain: Secondary | ICD-10-CM

## 2022-11-28 NOTE — Telephone Encounter (Signed)
PER Pharmacy, Tina [name] is a 67 year old female has requested a refill of Meloxicam 7.5 mg tablets.    Last Office Visit: 01/05/2022    Other Med Adult:  Most Recent BP Reading(s)  02/22/22 : 131/77        Cholesterol (mg/dL)   Date Value   16/02/9603 244 (*H)     LOW DENSITY LIPOPROTEIN DIRECT (mg/dL)   Date Value   54/01/8118 159     HIGH DENSITY LIPOPROTEIN (mg/dL)   Date Value   14/78/2956 58     TRIGLYCERIDES (mg/dL)   Date Value   21/30/8657 116         THYROID SCREEN TSH REFLEX FT4 (uIU/mL)   Date Value   11/08/2021 1.550         TSH (THYROID STIM HORMONE) (uIU/mL)   Date Value   09/23/2010 1.52       HEMOGLOBIN A1C (%)   Date Value   11/08/2021 6.2 (H)       No results found for: "POCA1C"      No results found for: "INR"    SODIUM (mmol/L)   Date Value   11/08/2021 143       POTASSIUM (mmol/L)   Date Value   11/08/2021 4.6           CREATININE (mg/dL)   Date Value   84/69/6295 0.8       Documented patient preferred pharmacies:    [id number] [place], [place] - [address]  Phone: [phone] Fax: [phone]

## 2022-12-13 IMAGING — [facility]
6 series · 40 of 40 positions shown · non-contrast
Comparison: none

[Series 3: sagital_stir_high-bw · sagittal · left · 3.5mm · 0.56mm/px · 4 of 20 slices shown]
[im 1/20]
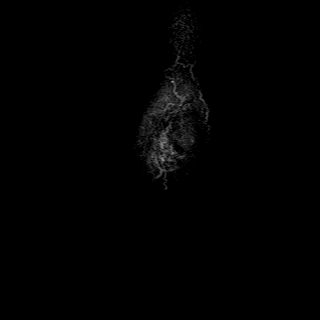
[im 7/20]
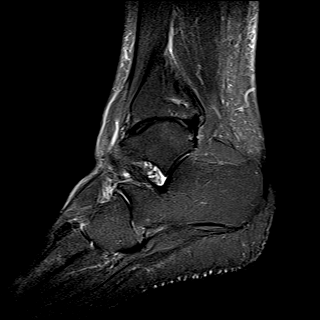
[im 13/20]
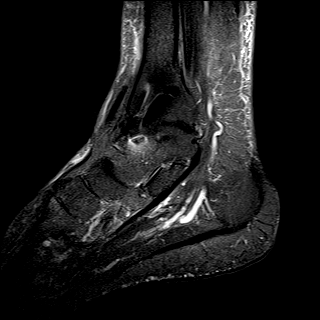
[im 20/20]
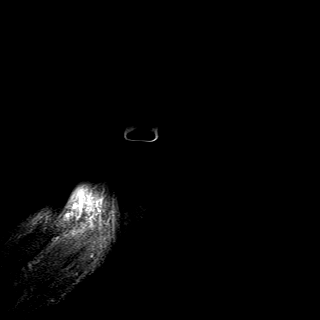

[Series 4: sagital _t1 · sagittal · left · 3.5mm · 0.42mm/px · 4 of 20 slices shown]
[im 1/20]
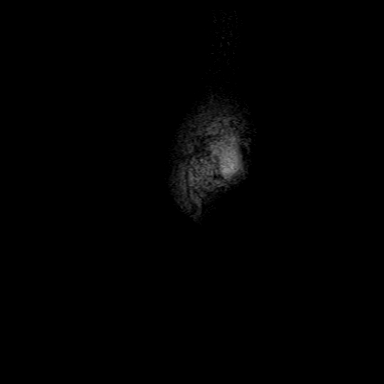
[im 7/20]
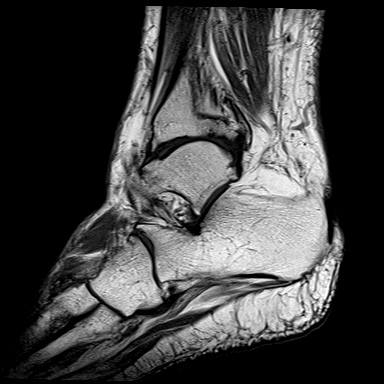
[im 13/20]
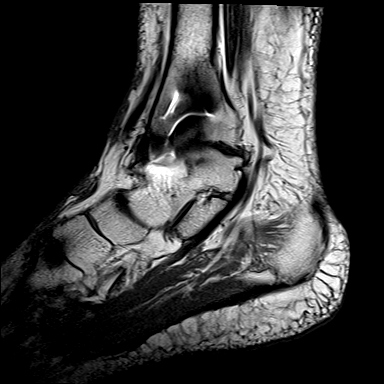
[im 20/20]
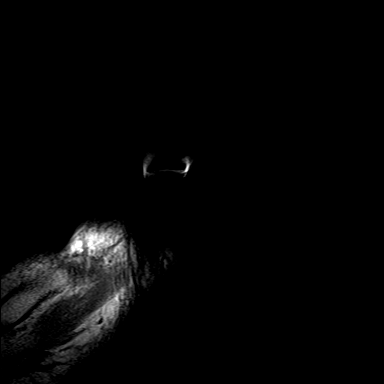

[Series 5: coronal_stir_high-bw · coronal · left · 3.5mm · 0.56mm/px · 8 of 30 slices shown]
[im 1/30]
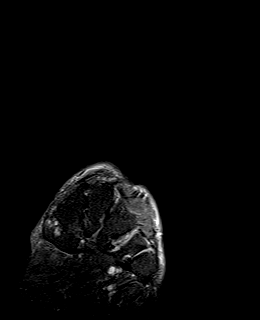
[im 5/30]
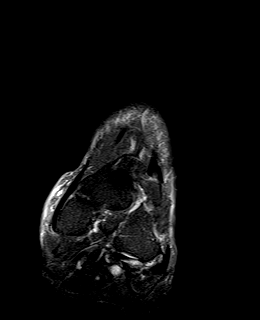
[im 9/30]
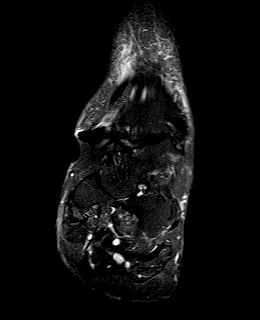
[im 13/30]
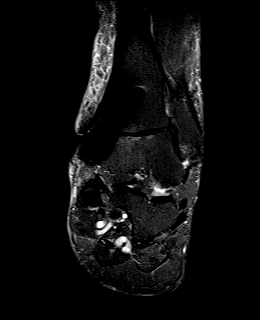
[im 17/30]
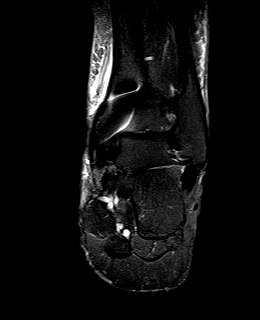
[im 21/30]
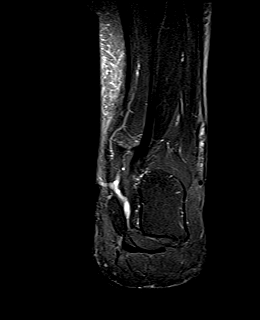
[im 25/30]
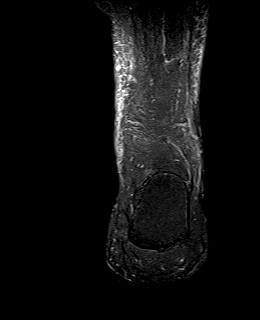
[im 30/30]
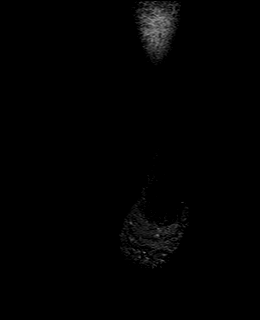

[Series 6: T1 · coronal · left · 3.5mm · 0.42mm/px · 8 of 30 slices shown (1 of 2)]
[im 1/30]
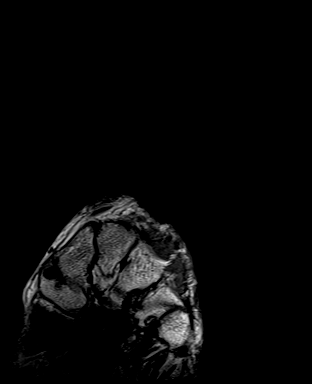
[im 5/30]
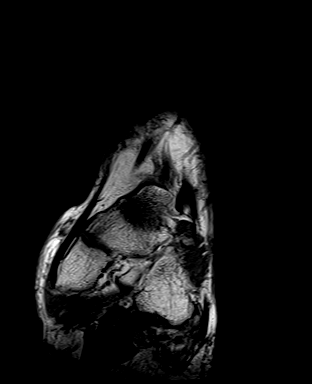
[im 9/30]
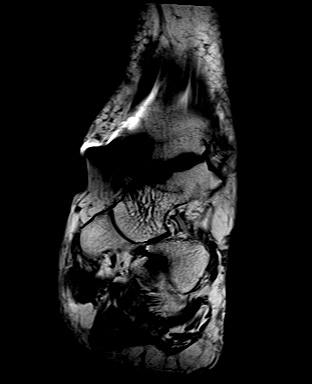
[im 13/30]
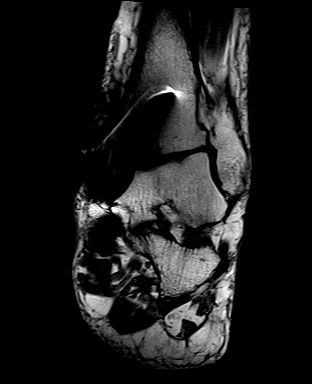
[im 17/30]
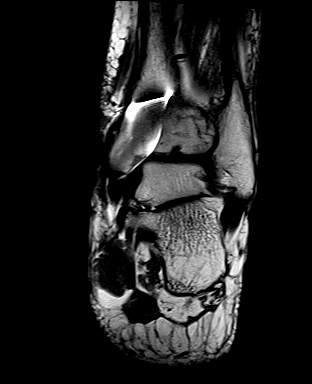
[im 21/30]
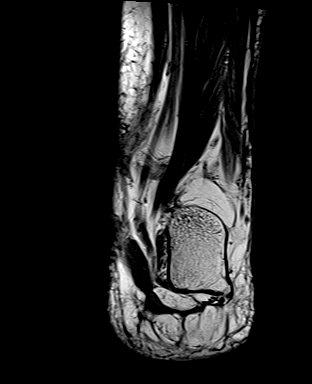
[im 25/30]
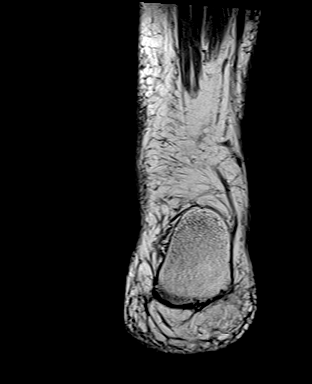
[im 30/30]
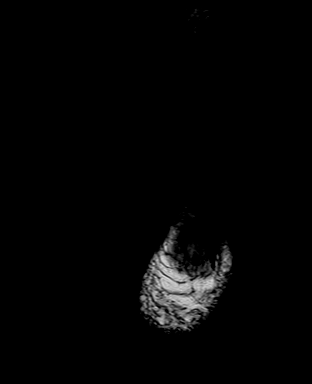

[Series 7: axial_stir_high-bw · axial · left · 3.5mm · 0.56mm/px · z∈[-68,+43]mm · 8 of 30 slices shown]
[im 1/30]
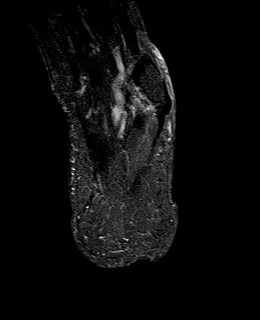
[im 5/30]
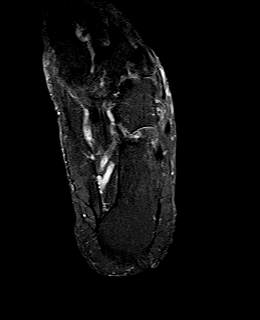
[im 9/30]
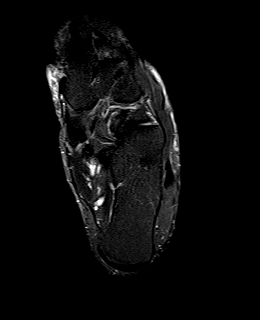
[im 13/30]
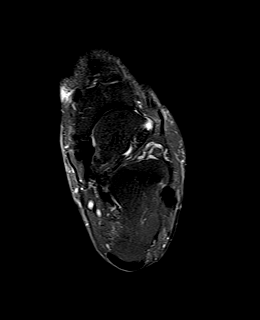
[im 17/30]
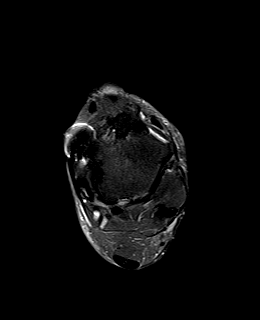
[im 21/30]
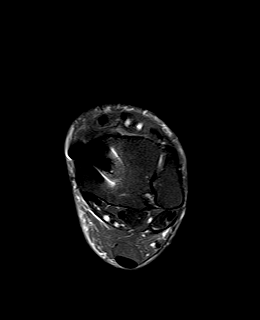
[im 25/30]
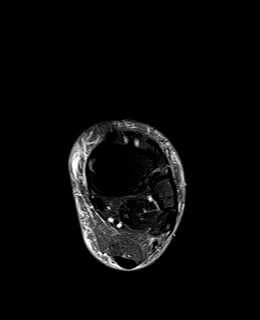
[im 30/30]
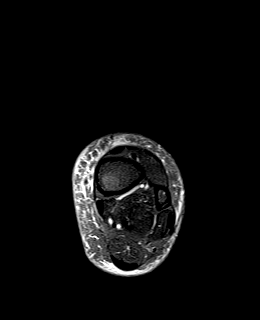

[Series 8: T1 · axial · left · 3.5mm · 0.42mm/px · z∈[-66,+45]mm · 8 of 30 slices shown (2 of 2)]
[im 1/30]
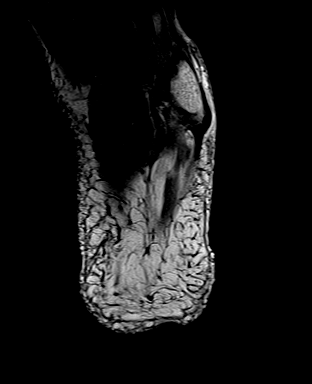
[im 5/30]
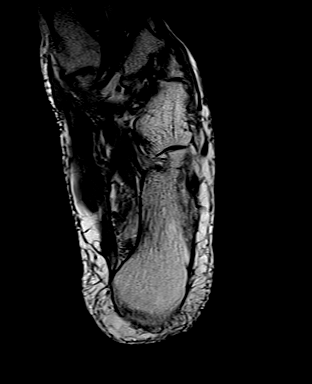
[im 9/30]
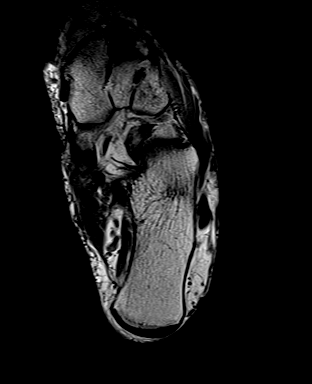
[im 13/30]
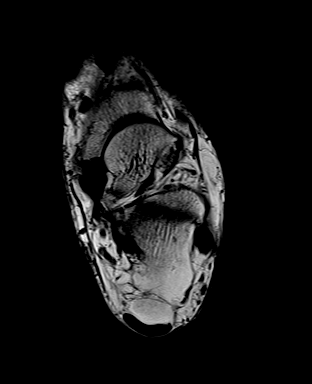
[im 17/30]
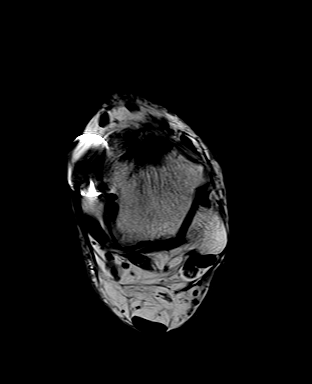
[im 21/30]
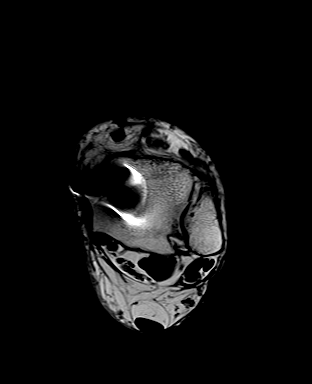
[im 25/30]
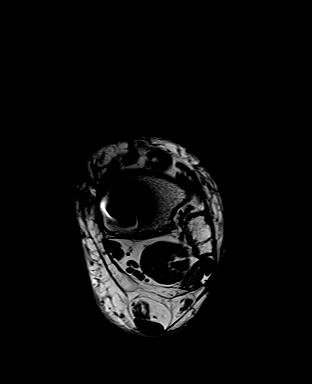
[im 30/30]
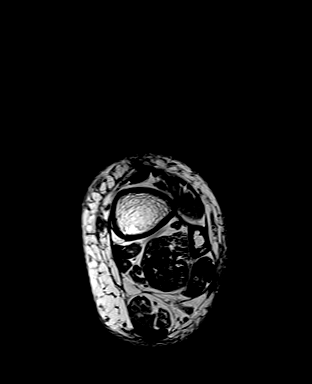

[40 of 40 positions shown; findings below may reference images not displayed]

[facility]

Técnica:Exame realizado pela técnicafast spin echoem sequências ponderadas em T1 e T2, em aquisições multiplanares, sem a administração de meio de contraste paramagnético endovenosa.

Análise:
Alterações decorrentes de manipulação cirúrgica pregressa com material de osteossíntese metálico no maléolo medial tibial produzindo artefatos de susceptibilidade magnética prejudicando a avaliação de algumas imagens.   
Artropatia degenerativa tibiotalar caracterizada por osteofitose marginal, irregularidade dos contornos das corticais e fissuras/ afilamento condral profundo, com edema do osso subcondral nas margens apostas.
Alterações degenerativas nas articulações metatarsotarsais dos I ao V dedos com osteofitose marginal, irregularidades corticais, condropatia e focos de edema ósseo subcondral.
Degeneração/lesão parcial dos folhetos profundos (tibiotalares anterior e posterior) e superficiais (tibionavicular, tibiocalcâneo e tibiomola) do ligamento deltoide(avaliação limitada).
Espessamento cicatricial do ligamento talofibular anterior, inferindo lesão parcial pregressa.
Leve tendinopatia distal / justainsercional do tibial posterior.
Tenossinovite retro e inframaleolar leve dos fibulares. 
Hipotrofia/ lipossubstituição do ventre muscular do abdutor do dedo mínimo, provavelmente relacionada à compressão do nervo calcâneo inferior (neuropatia de Rabinovich).
Pequeno derrame articular tibiotalar e subtalar.
Entesófitos degenerativos adjacentes à origem da fáscia plantar e junto à inserção calcaneana. 
Edema no tecido subcutâneo medial e lateral do tornozelo. 
Demais tendões e ligamentos com espessura e sinal normais.
Demais estruturas ósseas e superfícies condrais de morfologia e sinal conservados. 
Não há aumento significativo do líquido intrarticular.
Fáscia plantar de espessura e sinal normal.
Demais grupamentos musculares íntegros

## 2023-04-25 ENCOUNTER — Other Ambulatory Visit: Payer: Self-pay | Admitting: Internal Medicine

## 2023-08-02 ENCOUNTER — Other Ambulatory Visit: Payer: Self-pay | Admitting: Internal Medicine
